# Patient Record
Sex: Female | Born: 1945
Health system: Southern US, Community
[De-identification: ages and names within clinical notes are randomized; demographics above are authoritative.]

## PROBLEM LIST (undated history)

## (undated) DIAGNOSIS — R569 Unspecified convulsions: Secondary | ICD-10-CM

## (undated) DIAGNOSIS — M8580 Other specified disorders of bone density and structure, unspecified site: Secondary | ICD-10-CM

## (undated) DIAGNOSIS — I341 Nonrheumatic mitral (valve) prolapse: Secondary | ICD-10-CM

## (undated) DIAGNOSIS — E785 Hyperlipidemia, unspecified: Secondary | ICD-10-CM

## (undated) DIAGNOSIS — F32A Depression, unspecified: Secondary | ICD-10-CM

## (undated) DIAGNOSIS — G9050 Complex regional pain syndrome I, unspecified: Secondary | ICD-10-CM

## (undated) DIAGNOSIS — G43109 Migraine with aura, not intractable, without status migrainosus: Secondary | ICD-10-CM

## (undated) DIAGNOSIS — K219 Gastro-esophageal reflux disease without esophagitis: Secondary | ICD-10-CM

## (undated) DIAGNOSIS — Z8619 Personal history of other infectious and parasitic diseases: Secondary | ICD-10-CM

## (undated) DIAGNOSIS — F329 Major depressive disorder, single episode, unspecified: Secondary | ICD-10-CM

## (undated) DIAGNOSIS — L511 Stevens-Johnson syndrome: Secondary | ICD-10-CM

## (undated) DIAGNOSIS — F419 Anxiety disorder, unspecified: Secondary | ICD-10-CM

## (undated) HISTORY — DX: Nonrheumatic mitral (valve) prolapse: I34.1

## (undated) HISTORY — DX: Complex regional pain syndrome I, unspecified: G90.50

## (undated) HISTORY — DX: Hyperlipidemia, unspecified: E78.5

## (undated) HISTORY — DX: Unspecified convulsions: R56.9

## (undated) HISTORY — DX: Depression, unspecified: F32.A

## (undated) HISTORY — PX: CATARACT EXTRACTION: SUR2

## (undated) HISTORY — DX: Major depressive disorder, single episode, unspecified: F32.9

## (undated) HISTORY — DX: Other specified disorders of bone density and structure, unspecified site: M85.80

## (undated) HISTORY — DX: Anxiety disorder, unspecified: F41.9

## (undated) HISTORY — DX: Stevens-Johnson syndrome: L51.1

## (undated) HISTORY — PX: ABDOMINAL HYSTERECTOMY: SHX81

## (undated) HISTORY — DX: Gastro-esophageal reflux disease without esophagitis: K21.9

## (undated) HISTORY — DX: Personal history of other infectious and parasitic diseases: Z86.19

---

## 1996-12-14 HISTORY — PX: HEEL SPUR SURGERY: SHX665

## 1997-11-02 ENCOUNTER — Ambulatory Visit: Admission: RE | Admit: 1997-11-02 | Discharge: 1997-11-02 | Payer: Self-pay | Admitting: Internal Medicine

## 1999-05-14 ENCOUNTER — Other Ambulatory Visit: Admission: RE | Admit: 1999-05-14 | Discharge: 1999-05-14 | Payer: Self-pay | Admitting: *Deleted

## 2000-06-04 ENCOUNTER — Other Ambulatory Visit: Admission: RE | Admit: 2000-06-04 | Discharge: 2000-06-04 | Payer: Self-pay | Admitting: *Deleted

## 2000-08-05 ENCOUNTER — Emergency Department (HOSPITAL_COMMUNITY): Admission: EM | Admit: 2000-08-05 | Discharge: 2000-08-06 | Payer: Self-pay | Admitting: Emergency Medicine

## 2000-08-06 ENCOUNTER — Encounter: Payer: Self-pay | Admitting: Emergency Medicine

## 2001-09-01 ENCOUNTER — Encounter: Admission: RE | Admit: 2001-09-01 | Discharge: 2001-11-30 | Payer: Self-pay | Admitting: Neurology

## 2001-09-13 LAB — CONVERTED CEMR LAB

## 2001-09-19 ENCOUNTER — Ambulatory Visit (HOSPITAL_COMMUNITY): Admission: RE | Admit: 2001-09-19 | Discharge: 2001-09-19 | Payer: Self-pay | Admitting: Gastroenterology

## 2001-09-29 ENCOUNTER — Encounter (INDEPENDENT_AMBULATORY_CARE_PROVIDER_SITE_OTHER): Payer: Self-pay | Admitting: *Deleted

## 2001-09-29 ENCOUNTER — Inpatient Hospital Stay (HOSPITAL_COMMUNITY): Admission: RE | Admit: 2001-09-29 | Discharge: 2001-10-02 | Payer: Self-pay | Admitting: Obstetrics & Gynecology

## 2001-11-16 ENCOUNTER — Encounter: Payer: Self-pay | Admitting: Obstetrics and Gynecology

## 2001-11-16 ENCOUNTER — Ambulatory Visit (HOSPITAL_COMMUNITY): Admission: RE | Admit: 2001-11-16 | Discharge: 2001-11-16 | Payer: Self-pay | Admitting: Obstetrics and Gynecology

## 2002-03-16 HISTORY — PX: ORIF DISTAL RADIUS FRACTURE: SUR927

## 2002-10-25 ENCOUNTER — Encounter: Payer: Self-pay | Admitting: Internal Medicine

## 2003-12-24 ENCOUNTER — Observation Stay (HOSPITAL_COMMUNITY): Admission: AD | Admit: 2003-12-24 | Discharge: 2003-12-25 | Payer: Self-pay | Admitting: Internal Medicine

## 2004-01-03 ENCOUNTER — Encounter: Admission: RE | Admit: 2004-01-03 | Discharge: 2004-04-02 | Payer: Self-pay | Admitting: Psychology

## 2004-01-25 ENCOUNTER — Ambulatory Visit: Payer: Self-pay | Admitting: Internal Medicine

## 2004-03-20 ENCOUNTER — Ambulatory Visit: Payer: Self-pay | Admitting: Internal Medicine

## 2004-03-27 ENCOUNTER — Ambulatory Visit: Payer: Self-pay | Admitting: Internal Medicine

## 2004-04-14 ENCOUNTER — Ambulatory Visit: Payer: Self-pay | Admitting: Internal Medicine

## 2004-07-08 ENCOUNTER — Ambulatory Visit: Payer: Self-pay | Admitting: Internal Medicine

## 2004-10-07 ENCOUNTER — Ambulatory Visit: Payer: Self-pay | Admitting: Internal Medicine

## 2004-10-31 ENCOUNTER — Ambulatory Visit: Payer: Self-pay | Admitting: Internal Medicine

## 2004-11-28 ENCOUNTER — Ambulatory Visit: Payer: Self-pay | Admitting: Internal Medicine

## 2004-12-10 ENCOUNTER — Ambulatory Visit: Payer: Self-pay | Admitting: Internal Medicine

## 2005-01-19 ENCOUNTER — Ambulatory Visit: Payer: Self-pay | Admitting: Internal Medicine

## 2005-01-28 ENCOUNTER — Encounter: Admission: RE | Admit: 2005-01-28 | Discharge: 2005-01-28 | Payer: Self-pay | Admitting: Internal Medicine

## 2005-03-24 ENCOUNTER — Ambulatory Visit: Payer: Self-pay | Admitting: Internal Medicine

## 2005-04-01 ENCOUNTER — Ambulatory Visit: Payer: Self-pay | Admitting: Cardiovascular Disease

## 2005-04-08 ENCOUNTER — Ambulatory Visit: Payer: Self-pay | Admitting: Internal Medicine

## 2005-06-04 ENCOUNTER — Encounter: Payer: Self-pay | Admitting: Internal Medicine

## 2005-06-04 LAB — HM COLONOSCOPY: HM Colonoscopy: NORMAL

## 2005-11-04 ENCOUNTER — Ambulatory Visit: Payer: Self-pay | Admitting: Internal Medicine

## 2005-11-18 ENCOUNTER — Ambulatory Visit: Payer: Self-pay | Admitting: Internal Medicine

## 2005-12-02 ENCOUNTER — Ambulatory Visit: Payer: Self-pay | Admitting: Family Medicine

## 2005-12-02 ENCOUNTER — Encounter: Payer: Self-pay | Admitting: Internal Medicine

## 2006-01-20 ENCOUNTER — Ambulatory Visit: Payer: Self-pay | Admitting: Internal Medicine

## 2006-02-02 ENCOUNTER — Ambulatory Visit: Payer: Self-pay | Admitting: Internal Medicine

## 2006-05-26 ENCOUNTER — Ambulatory Visit: Payer: Self-pay | Admitting: Internal Medicine

## 2006-05-31 ENCOUNTER — Ambulatory Visit: Payer: Self-pay | Admitting: Internal Medicine

## 2006-06-14 ENCOUNTER — Ambulatory Visit: Payer: Self-pay | Admitting: Internal Medicine

## 2006-08-16 ENCOUNTER — Ambulatory Visit: Payer: Self-pay | Admitting: Internal Medicine

## 2006-08-16 LAB — CONVERTED CEMR LAB
BUN: 9 mg/dL (ref 6–23)
CO2: 26 meq/L (ref 19–32)
Calcium: 9.2 mg/dL (ref 8.4–10.5)
Chloride: 110 meq/L (ref 96–112)
Creatinine, Ser: 0.5 mg/dL (ref 0.4–1.2)
GFR calc Af Amer: 162 mL/min
GFR calc non Af Amer: 134 mL/min
Glucose, Bld: 91 mg/dL (ref 70–99)
Potassium: 4 meq/L (ref 3.5–5.1)
Sodium: 142 meq/L (ref 135–145)

## 2006-08-19 ENCOUNTER — Ambulatory Visit: Payer: Self-pay | Admitting: Cardiovascular Disease

## 2006-09-15 ENCOUNTER — Encounter: Admission: RE | Admit: 2006-09-15 | Discharge: 2006-09-15 | Payer: Self-pay | Admitting: Family Medicine

## 2006-09-21 ENCOUNTER — Telehealth: Payer: Self-pay | Admitting: Internal Medicine

## 2006-10-11 DIAGNOSIS — Z872 Personal history of diseases of the skin and subcutaneous tissue: Secondary | ICD-10-CM | POA: Insufficient documentation

## 2006-10-11 DIAGNOSIS — K219 Gastro-esophageal reflux disease without esophagitis: Secondary | ICD-10-CM | POA: Insufficient documentation

## 2006-10-11 DIAGNOSIS — F4323 Adjustment disorder with mixed anxiety and depressed mood: Secondary | ICD-10-CM | POA: Insufficient documentation

## 2006-10-11 DIAGNOSIS — M899 Disorder of bone, unspecified: Secondary | ICD-10-CM | POA: Insufficient documentation

## 2006-10-11 DIAGNOSIS — F329 Major depressive disorder, single episode, unspecified: Secondary | ICD-10-CM

## 2006-10-11 DIAGNOSIS — F419 Anxiety disorder, unspecified: Secondary | ICD-10-CM

## 2006-10-11 DIAGNOSIS — F32A Depression, unspecified: Secondary | ICD-10-CM | POA: Insufficient documentation

## 2006-10-11 DIAGNOSIS — M949 Disorder of cartilage, unspecified: Secondary | ICD-10-CM

## 2006-10-12 DIAGNOSIS — E785 Hyperlipidemia, unspecified: Secondary | ICD-10-CM | POA: Insufficient documentation

## 2006-10-19 ENCOUNTER — Telehealth: Payer: Self-pay | Admitting: Internal Medicine

## 2006-11-29 ENCOUNTER — Ambulatory Visit: Payer: Self-pay | Admitting: Internal Medicine

## 2006-11-29 LAB — CONVERTED CEMR LAB
ALT: 17 units/L (ref 0–35)
AST: 20 units/L (ref 0–37)
Albumin: 3.6 g/dL (ref 3.5–5.2)
Alkaline Phosphatase: 99 units/L (ref 39–117)
BUN: 9 mg/dL (ref 6–23)
Basophils Absolute: 0.1 10*3/uL (ref 0.0–0.1)
Basophils Relative: 0.9 % (ref 0.0–1.0)
Bilirubin Urine: NEGATIVE
Bilirubin, Direct: 0.1 mg/dL (ref 0.0–0.3)
CO2: 27 meq/L (ref 19–32)
Calcium: 8.7 mg/dL (ref 8.4–10.5)
Chloride: 111 meq/L (ref 96–112)
Cholesterol: 228 mg/dL (ref 0–200)
Creatinine, Ser: 0.5 mg/dL (ref 0.4–1.2)
Direct LDL: 162.4 mg/dL
Eosinophils Absolute: 0.1 10*3/uL (ref 0.0–0.6)
Eosinophils Relative: 2.2 % (ref 0.0–5.0)
GFR calc Af Amer: 162 mL/min
GFR calc non Af Amer: 134 mL/min
Glucose, Bld: 82 mg/dL (ref 70–99)
Glucose, Urine, Semiquant: NEGATIVE
HCT: 36.3 % (ref 36.0–46.0)
HDL: 35.3 mg/dL — ABNORMAL LOW (ref >39.0)
Hemoglobin: 12.7 g/dL (ref 12.0–15.0)
Ketones, urine, test strip: NEGATIVE
Lymphocytes Relative: 29.9 % (ref 12.0–46.0)
MCHC: 35 g/dL (ref 30.0–36.0)
MCV: 89.3 fL (ref 78.0–100.0)
Monocytes Absolute: 0.5 10*3/uL (ref 0.2–0.7)
Monocytes Relative: 8.1 % (ref 3.0–11.0)
Neutro Abs: 3.9 10*3/uL (ref 1.4–7.7)
Neutrophils Relative %: 58.9 % (ref 43.0–77.0)
Nitrite: NEGATIVE
Platelets: 283 10*3/uL (ref 150–400)
Potassium: 4 meq/L (ref 3.5–5.1)
Protein, U semiquant: NEGATIVE
RBC: 4.06 M/uL (ref 3.87–5.11)
RDW: 12.6 % (ref 11.5–14.6)
Sodium: 143 meq/L (ref 135–145)
Specific Gravity, Urine: 1.02
TSH: 1.88 microintl units/mL (ref 0.35–5.50)
Total Bilirubin: 0.7 mg/dL (ref 0.3–1.2)
Total CHOL/HDL Ratio: 6.5
Total Protein: 6.4 g/dL (ref 6.0–8.3)
Triglycerides: 175 mg/dL — ABNORMAL HIGH (ref 0–149)
Urobilinogen, UA: 0.2
VLDL: 35 mg/dL (ref 0–40)
WBC Urine, dipstick: NEGATIVE
WBC: 6.6 10*3/uL (ref 4.5–10.5)
pH: 5.5

## 2006-12-06 ENCOUNTER — Ambulatory Visit: Payer: Self-pay | Admitting: Internal Medicine

## 2007-02-03 ENCOUNTER — Encounter: Payer: Self-pay | Admitting: Internal Medicine

## 2007-02-03 ENCOUNTER — Telehealth: Payer: Self-pay | Admitting: Internal Medicine

## 2007-02-04 ENCOUNTER — Encounter: Admission: RE | Admit: 2007-02-04 | Discharge: 2007-02-04 | Payer: Self-pay | Admitting: Internal Medicine

## 2007-02-04 ENCOUNTER — Telehealth: Payer: Self-pay | Admitting: Internal Medicine

## 2007-02-04 ENCOUNTER — Ambulatory Visit: Payer: Self-pay | Admitting: Internal Medicine

## 2007-02-04 LAB — CONVERTED CEMR LAB
ALT: 18 units/L (ref 0–35)
AST: 21 units/L (ref 0–37)
Albumin: 3.9 g/dL (ref 3.5–5.2)
Alkaline Phosphatase: 99 units/L (ref 39–117)
BUN: 10 mg/dL (ref 6–23)
Basophils Absolute: 0 10*3/uL (ref 0.0–0.1)
Basophils Relative: 0.4 % (ref 0.0–1.0)
Bilirubin, Direct: 0.1 mg/dL (ref 0.0–0.3)
CO2: 30 meq/L (ref 19–32)
Calcium: 9.5 mg/dL (ref 8.4–10.5)
Chloride: 107 meq/L (ref 96–112)
Creatinine, Ser: 0.5 mg/dL (ref 0.4–1.2)
Eosinophils Absolute: 0.1 10*3/uL (ref 0.0–0.6)
Eosinophils Relative: 1.6 % (ref 0.0–5.0)
GFR calc Af Amer: 162 mL/min
GFR calc non Af Amer: 134 mL/min
Glucose, Bld: 93 mg/dL (ref 70–99)
HCT: 38.7 % (ref 36.0–46.0)
Hemoglobin: 13.2 g/dL (ref 12.0–15.0)
Lymphocytes Relative: 25.7 % (ref 12.0–46.0)
MCHC: 34 g/dL (ref 30.0–36.0)
MCV: 90 fL (ref 78.0–100.0)
Monocytes Absolute: 0.6 10*3/uL (ref 0.2–0.7)
Monocytes Relative: 8.6 % (ref 3.0–11.0)
Neutro Abs: 4.8 10*3/uL (ref 1.4–7.7)
Neutrophils Relative %: 63.7 % (ref 43.0–77.0)
Platelets: 295 10*3/uL (ref 150–400)
Potassium: 4.5 meq/L (ref 3.5–5.1)
RBC: 4.31 M/uL (ref 3.87–5.11)
RDW: 12.6 % (ref 11.5–14.6)
Sodium: 143 meq/L (ref 135–145)
Total Bilirubin: 0.9 mg/dL (ref 0.3–1.2)
Total Protein: 7 g/dL (ref 6.0–8.3)
WBC: 7.4 10*3/uL (ref 4.5–10.5)

## 2007-02-09 ENCOUNTER — Telehealth: Payer: Self-pay | Admitting: Internal Medicine

## 2007-05-20 ENCOUNTER — Ambulatory Visit: Payer: Self-pay | Admitting: Internal Medicine

## 2007-07-22 ENCOUNTER — Telehealth (INDEPENDENT_AMBULATORY_CARE_PROVIDER_SITE_OTHER): Payer: Self-pay | Admitting: *Deleted

## 2007-07-25 ENCOUNTER — Ambulatory Visit: Payer: Self-pay | Admitting: Internal Medicine

## 2007-08-02 ENCOUNTER — Ambulatory Visit: Payer: Self-pay | Admitting: Internal Medicine

## 2007-12-13 ENCOUNTER — Ambulatory Visit: Payer: Self-pay | Admitting: Internal Medicine

## 2007-12-13 LAB — CONVERTED CEMR LAB
ALT: 19 units/L (ref 0–35)
AST: 26 units/L (ref 0–37)
Albumin: 3.9 g/dL (ref 3.5–5.2)
Alkaline Phosphatase: 85 units/L (ref 39–117)
BUN: 15 mg/dL (ref 6–23)
Basophils Absolute: 0 10*3/uL (ref 0.0–0.1)
Basophils Relative: 0.4 % (ref 0.0–3.0)
Bilirubin Urine: NEGATIVE
Bilirubin, Direct: 0.1 mg/dL (ref 0.0–0.3)
CO2: 27 meq/L (ref 19–32)
Calcium: 9 mg/dL (ref 8.4–10.5)
Chloride: 107 meq/L (ref 96–112)
Cholesterol: 259 mg/dL (ref 0–200)
Creatinine, Ser: 0.7 mg/dL (ref 0.4–1.2)
Direct LDL: 196.7 mg/dL
Eosinophils Absolute: 0.1 10*3/uL (ref 0.0–0.7)
Eosinophils Relative: 1.7 % (ref 0.0–5.0)
GFR calc Af Amer: 109 mL/min
GFR calc non Af Amer: 90 mL/min
Glucose, Bld: 101 mg/dL — ABNORMAL HIGH (ref 70–99)
Glucose, Urine, Semiquant: NEGATIVE
HCT: 37.1 % (ref 36.0–46.0)
HDL: 40.5 mg/dL (ref >39.0)
Hemoglobin: 12.9 g/dL (ref 12.0–15.0)
Ketones, urine, test strip: NEGATIVE
Lymphocytes Relative: 29.9 % (ref 12.0–46.0)
MCHC: 34.9 g/dL (ref 30.0–36.0)
MCV: 91 fL (ref 78.0–100.0)
Monocytes Absolute: 0.6 10*3/uL (ref 0.1–1.0)
Monocytes Relative: 10.8 % (ref 3.0–12.0)
Neutro Abs: 3.4 10*3/uL (ref 1.4–7.7)
Neutrophils Relative %: 57.2 % (ref 43.0–77.0)
Nitrite: NEGATIVE
Platelets: 312 10*3/uL (ref 150–400)
Potassium: 4.2 meq/L (ref 3.5–5.1)
Protein, U semiquant: NEGATIVE
RBC: 4.07 M/uL (ref 3.87–5.11)
RDW: 12.2 % (ref 11.5–14.6)
Sodium: 140 meq/L (ref 135–145)
Specific Gravity, Urine: 1.025
TSH: 2.12 microintl units/mL (ref 0.35–5.50)
Total Bilirubin: 0.7 mg/dL (ref 0.3–1.2)
Total CHOL/HDL Ratio: 6.4
Total Protein: 7 g/dL (ref 6.0–8.3)
Triglycerides: 158 mg/dL — ABNORMAL HIGH (ref 0–149)
Urobilinogen, UA: 0.2
VLDL: 32 mg/dL (ref 0–40)
WBC: 5.9 10*3/uL (ref 4.5–10.5)
pH: 5.5

## 2007-12-27 ENCOUNTER — Ambulatory Visit: Payer: Self-pay | Admitting: Internal Medicine

## 2008-02-07 ENCOUNTER — Ambulatory Visit: Payer: Self-pay | Admitting: Internal Medicine

## 2008-02-14 ENCOUNTER — Ambulatory Visit: Payer: Self-pay | Admitting: Internal Medicine

## 2008-02-21 ENCOUNTER — Ambulatory Visit: Payer: Self-pay | Admitting: Internal Medicine

## 2008-02-21 LAB — CONVERTED CEMR LAB
Bilirubin Urine: NEGATIVE
Blood in Urine, dipstick: NEGATIVE
Glucose, Urine, Semiquant: NEGATIVE
Ketones, urine, test strip: NEGATIVE
Nitrite: NEGATIVE
Protein, U semiquant: NEGATIVE
Specific Gravity, Urine: 1.02
Urobilinogen, UA: 0.2
WBC Urine, dipstick: NEGATIVE
pH: 6

## 2008-06-14 ENCOUNTER — Ambulatory Visit: Payer: Self-pay | Admitting: Internal Medicine

## 2008-06-14 ENCOUNTER — Encounter: Payer: Self-pay | Admitting: Internal Medicine

## 2008-06-14 ENCOUNTER — Observation Stay (HOSPITAL_COMMUNITY): Admission: EM | Admit: 2008-06-14 | Discharge: 2008-06-15 | Payer: Self-pay | Admitting: Emergency Medicine

## 2008-06-14 ENCOUNTER — Ambulatory Visit: Payer: Self-pay | Admitting: Cardiology

## 2008-06-15 ENCOUNTER — Ambulatory Visit: Payer: Self-pay | Admitting: Surgery

## 2008-06-15 ENCOUNTER — Encounter: Payer: Self-pay | Admitting: Internal Medicine

## 2008-06-18 ENCOUNTER — Telehealth: Payer: Self-pay | Admitting: Internal Medicine

## 2008-06-20 ENCOUNTER — Ambulatory Visit: Payer: Self-pay | Admitting: Internal Medicine

## 2008-06-25 ENCOUNTER — Telehealth: Payer: Self-pay | Admitting: Internal Medicine

## 2008-06-25 LAB — CONVERTED CEMR LAB
BUN: 10 mg/dL (ref 6–23)
CO2: 30 meq/L (ref 19–32)
Calcium: 9 mg/dL (ref 8.4–10.5)
Chloride: 111 meq/L (ref 96–112)
Creatinine, Ser: 0.5 mg/dL (ref 0.4–1.2)
GFR calc non Af Amer: 133.04 mL/min (ref >60)
Glucose, Bld: 90 mg/dL (ref 70–99)
Potassium: 4.3 meq/L (ref 3.5–5.1)
Sodium: 145 meq/L (ref 135–145)

## 2008-07-09 ENCOUNTER — Encounter: Payer: Self-pay | Admitting: Internal Medicine

## 2008-12-14 ENCOUNTER — Encounter: Payer: Self-pay | Admitting: Internal Medicine

## 2009-03-16 DIAGNOSIS — G9050 Complex regional pain syndrome I, unspecified: Secondary | ICD-10-CM

## 2009-03-16 HISTORY — DX: Complex regional pain syndrome I, unspecified: G90.50

## 2009-03-21 ENCOUNTER — Telehealth: Payer: Self-pay | Admitting: Internal Medicine

## 2009-03-26 ENCOUNTER — Encounter: Payer: Self-pay | Admitting: Internal Medicine

## 2009-04-22 ENCOUNTER — Telehealth: Payer: Self-pay | Admitting: Internal Medicine

## 2009-08-27 ENCOUNTER — Ambulatory Visit: Payer: Self-pay | Admitting: Internal Medicine

## 2009-12-18 ENCOUNTER — Telehealth: Payer: Self-pay | Admitting: Internal Medicine

## 2010-03-19 ENCOUNTER — Encounter
Admission: RE | Admit: 2010-03-19 | Discharge: 2010-03-19 | Payer: Self-pay | Source: Home / Self Care | Attending: Internal Medicine | Admitting: Internal Medicine

## 2010-03-19 LAB — HM MAMMOGRAPHY

## 2010-03-21 ENCOUNTER — Ambulatory Visit
Admission: RE | Admit: 2010-03-21 | Discharge: 2010-03-21 | Payer: Self-pay | Source: Home / Self Care | Attending: Internal Medicine | Admitting: Internal Medicine

## 2010-03-21 ENCOUNTER — Other Ambulatory Visit: Payer: Self-pay | Admitting: Internal Medicine

## 2010-03-21 LAB — LIPID PANEL
Cholesterol: 245 mg/dL — ABNORMAL HIGH (ref 0–200)
HDL: 39 mg/dL — ABNORMAL LOW (ref >39.00)
Total CHOL/HDL Ratio: 6
Triglycerides: 175 mg/dL — ABNORMAL HIGH (ref 0.0–149.0)
VLDL: 35 mg/dL (ref 0.0–40.0)

## 2010-03-21 LAB — HEPATIC FUNCTION PANEL
ALT: 17 U/L (ref 0–35)
AST: 21 U/L (ref 0–37)
Albumin: 3.8 g/dL (ref 3.5–5.2)
Alkaline Phosphatase: 96 U/L (ref 39–117)
Bilirubin, Direct: 0 mg/dL (ref 0.0–0.3)
Total Bilirubin: 0.8 mg/dL (ref 0.3–1.2)
Total Protein: 7 g/dL (ref 6.0–8.3)

## 2010-03-21 LAB — CBC WITH DIFFERENTIAL/PLATELET
Basophils Absolute: 0 10*3/uL (ref 0.0–0.1)
Basophils Relative: 0.7 % (ref 0.0–3.0)
Eosinophils Absolute: 0.2 10*3/uL (ref 0.0–0.7)
Eosinophils Relative: 2.6 % (ref 0.0–5.0)
HCT: 37.9 % (ref 36.0–46.0)
Hemoglobin: 13 g/dL (ref 12.0–15.0)
Lymphocytes Relative: 38.8 % (ref 12.0–46.0)
Lymphs Abs: 2.5 10*3/uL (ref 0.7–4.0)
MCHC: 34.3 g/dL (ref 30.0–36.0)
MCV: 90.5 fl (ref 78.0–100.0)
Monocytes Absolute: 0.7 10*3/uL (ref 0.1–1.0)
Monocytes Relative: 10.1 % (ref 3.0–12.0)
Neutro Abs: 3.1 10*3/uL (ref 1.4–7.7)
Neutrophils Relative %: 47.8 % (ref 43.0–77.0)
Platelets: 310 10*3/uL (ref 150.0–400.0)
RBC: 4.19 Mil/uL (ref 3.87–5.11)
RDW: 13.1 % (ref 11.5–14.6)
WBC: 6.6 10*3/uL (ref 4.5–10.5)

## 2010-03-21 LAB — URINALYSIS
Bilirubin Urine: NEGATIVE
Ketones, ur: NEGATIVE
Leukocytes, UA: NEGATIVE
Nitrite: NEGATIVE
Specific Gravity, Urine: 1.025 (ref 1.000–1.030)
Total Protein, Urine: NEGATIVE
Urine Glucose: NEGATIVE
Urobilinogen, UA: 0.2 (ref 0.0–1.0)
pH: 5.5 (ref 5.0–8.0)

## 2010-03-21 LAB — BASIC METABOLIC PANEL
BUN: 17 mg/dL (ref 6–23)
CO2: 25 mEq/L (ref 19–32)
Calcium: 9.2 mg/dL (ref 8.4–10.5)
Chloride: 109 mEq/L (ref 96–112)
Creatinine, Ser: 0.6 mg/dL (ref 0.4–1.2)
GFR: 107.18 mL/min (ref >60.00)
Glucose, Bld: 96 mg/dL (ref 70–99)
Potassium: 4.3 mEq/L (ref 3.5–5.1)
Sodium: 143 mEq/L (ref 135–145)

## 2010-03-21 LAB — LDL CHOLESTEROL, DIRECT: Direct LDL: 177.6 mg/dL

## 2010-03-24 LAB — TSH: TSH: 3.72 u[IU]/mL (ref 0.35–5.50)

## 2010-04-04 ENCOUNTER — Ambulatory Visit
Admission: RE | Admit: 2010-04-04 | Discharge: 2010-04-04 | Payer: Self-pay | Source: Home / Self Care | Attending: Internal Medicine | Admitting: Internal Medicine

## 2010-04-04 DIAGNOSIS — G43019 Migraine without aura, intractable, without status migrainosus: Secondary | ICD-10-CM | POA: Insufficient documentation

## 2010-04-04 DIAGNOSIS — G4710 Hypersomnia, unspecified: Secondary | ICD-10-CM | POA: Insufficient documentation

## 2010-04-17 NOTE — Miscellaneous (Signed)
Summary: flu shot   Clinical Lists Changes  Observations: Added new observation of FLU VAX: fluvirin (03/23/2009 11:18)      Immunization History:  Influenza Immunization History:    Influenza:  fluvirin (03/23/2009)  given at [facility]

## 2010-04-17 NOTE — Progress Notes (Signed)
Summary: FYI  Phone Note Call from Patient Call back at [facility] Phone [phone]   Caller: Patient Summary of Call: Pt is still being denied insurance because of COPD kidney disease diagnosis, but pt does not have COPD or kidney issues. According to [employer], a letter was sent to Dr. Cato Mulligan stating the reason why patient was denied  was due to COPD DX and kidney issues. Pt says for some reason, insurance company is not believing the statement Dr. Cato Mulligan had written.  Initial call taken by: Lucy Antigua,  March 21, 2009 3:29 PM  Follow-up for Phone Call        We do not have letter from insurance giving why they think she has these prob;ems.  Pt is working with Teacher, English as a foreign language and will try to get [place] this letter so Dr Cato Mulligan can respond. Follow-up by: Gladis Riffle, RN,  March 21, 2009 3:55 PM

## 2010-04-17 NOTE — Assessment & Plan Note (Signed)
Summary: fu on meds/njr   Vital Signs:  Patient profile:   65 year old female Weight:      150 pounds BMI:     26.25 Temp:     98.6 degrees F oral Pulse rate:   68 / minute Pulse rhythm:   regular Resp:     12 per minute BP sitting:   128 / 80  (left arm) Cuff size:   regular  Vitals Entered By: Gladis Riffle, RN (August 27, 2009 8:51 AM) CC: med review, needs refills Is Patient Diabetic? No   Primary Care Provider:  Birdie Sons MD  CC:  med review and needs refills.  History of Present Illness:  Follow-Up Visit      This is a 65 year old woman who presents for Follow-up visit.  The patient denies chest pain and palpitations.  Since the last visit the patient notes no new problems or concerns.  The patient reports taking meds as prescribed.  When questioned about possible medication side effects, the patient notes none.    All other systems reviewed and were negative   Preventive Screening-Counseling & Management  Alcohol-Tobacco     Alcohol drinks/day: 0     Smoking Status: quit > 6 months     Year Quit: 1985  Current Problems (verified): 1)  Elevated Bp Reading Without Dx Hypertension  (ICD-796.2) 2)  Migraine Unsp W/intractbl w/o Status Migrainosus  (ICD-346.91) 3)  Physical Examination  (ICD-V70.0) 4)  Stevens-johnson Syndrome, Hx of  (ICD-V13.3) 5)  Hyperlipidemia  (ICD-272.4) 6)  Osteopenia  (ICD-733.90) 7)  Gerd  (ICD-530.81) 8)  Depression  (ICD-311) 9)  Anxiety  (ICD-300.00)  Current Medications (verified): 1)  Atenolol 25 Mg Tabs (Atenolol) .... 1/2 By Mouth Two Times A Day 2)  Citalopram Hydrobromide 20 Mg  Tabs (Citalopram Hydrobromide) .... Take 1 Tablet By Mouth Once A Day 3)  Zegerid Otc 20-1100 Mg Caps (Omeprazole-Sodium Bicarbonate) .... Once Daily  Allergies (verified): 1)  ! Erythromycin 2)  ! Septra 3)  ! Pcn 4)  ! Sulfa 5)  ! Cephalexin 6)  ! Lactated Ringers (Lactated Ringers) 7)  ! Epinephrine 8)  ! Trimethoprim (Trimethoprim)  Past  History:  Past Medical History: Last updated: 10/11/2006 Anxiety Depression GERD Osteopenia MVP Migraine Allergies Levonne Spiller (sulfa) Reflex sympathetic dystrophy Hyperlipidemia  Past Surgical History: Last updated: 10/11/2006 Miscarriage x 2- 77, 79 Rt Heel spur-12/1996 Childbirth x 2 natural- [age] TAH/BSO  Family History: Last updated: 08/27/2009  parents living-elderly  brother deceased--CLL 89 yo  Social History: Last updated: 08/27/2009 Former Smoker Alcohol use-no Drug use-no Married---  Risk Factors: Alcohol Use: 0 (08/27/2009)  Risk Factors: Smoking Status: quit > 6 months (08/27/2009)  Family History:  parents living-elderly  brother deceased--CLL 14 yo  Social History: Former Smoker Alcohol use-no Drug use-no Married---  Physical Exam  General:  alert and well-developed.   Head:  normocephalic and atraumatic.   Ears:  R ear normal and L ear normal.   Nose:  no external deformity and no external erythema.   Neck:  No deformities, masses, or tenderness noted. Lungs:  normal respiratory effort and no intercostal retractions.   Heart:  normal rate and regular rhythm.   Abdomen:  soft and non-tender.   Pulses:  R radial normal and L radial normal.   Neurologic:  cranial nerves II-XII intact and gait normal.   Skin:  turgor normal and color normal.   Cervical Nodes:  no anterior cervical adenopathy and no posterior  cervical adenopathy.   Psych:  normally interactive and good eye contact.     Impression & Recommendations:  Problem # 1:  HYPERLIPIDEMIA (ICD-272.4) she refuses lab draw/medications $ of testing/meds too much Labs Reviewed: SGOT: 26 (12/13/2007)   SGPT: 19 (12/13/2007)   HDL:40.5 (12/13/2007), 35.3 (11/29/2006)  LDL:DEL (12/13/2007), DEL (11/29/2006)  Chol:259 (12/13/2007), 228 (11/29/2006)  Trig:158 (12/13/2007), 175 (11/29/2006)  Problem # 2:  GERD (ICD-530.81) continue current medications  sxs are controlled Her  updated medication list for this problem includes:    Zegerid Otc 20-1100 Mg Caps (Omeprazole-sodium bicarbonate) ..... Once daily  Labs Reviewed: Hgb: 12.9 (12/13/2007)   Hct: 37.1 (12/13/2007)  Problem # 3:  MIGRAINE UNSP W/INTRACTBL W/O STATUS MIGRAINOSUS (ICD-346.91)  doing well on meds Her updated medication list for this problem includes:    Atenolol 25 Mg Tabs (Atenolol) .Marland Kitchen... 1/2 by mouth two times a day   CT of Brain: CT Head Without Contrast - STATUS: Final  IMAGE                                     Perform Date: 1 Apr10 15:38  Ordered By: Thora Lance Date: 1 Apr10 15:09  Facility: [facility]                              Department: CT  Service Report Text  [facility] Accession Number: [id number]      Clinical Data: Headache.  Right-sided weakness.    CT HEAD WITHOUT CONTRAST    Technique:  Contiguous axial images were obtained from the base of   the skull through the vertex without contrast.    Comparison: MRI 12/24/2003.    Findings: Ventricle size is normal.  No intracranial hemorrhage is   seen.  There is no infarct or mass.  White matter appears normal.   Low density lesion in the left parietal bone with mild thickening   of the skull.  This was hyperintense on T2 on the prior MRI and  is   unchanged.  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    IMPRESSION:   No acute intracranial abnormality.    Read By:  Camelia Phenes,  M.D.   Released By:  Camelia Phenes,  M.D. (06/14/2008)  Problem # 4:  DEPRESSION (ICD-311) Tolerating meds and doing well on current meds Her updated medication list for this problem includes:    Citalopram Hydrobromide 20 Mg Tabs (Citalopram hydrobromide) .Marland Kitchen... Take 1 tablet by mouth once a day  Complete Medication List: 1)  Atenolol 25 Mg Tabs (Atenolol) .... 1/2 by mouth two times a day 2)  Citalopram Hydrobromide 20 Mg Tabs (Citalopram hydrobromide) .... Take 1 tablet by mouth once a day 3)  Zegerid Otc 20-1100 Mg  Caps (Omeprazole-sodium bicarbonate) .... Once daily Prescriptions: ZEGERID OTC 20-1100 MG CAPS (OMEPRAZOLE-SODIUM BICARBONATE) once daily  #90 x 3   Entered and Authorized by:   Birdie Sons MD   Signed by:   Birdie Sons MD on 08/27/2009   Method used:   Electronically to        [facility]  [phone]* (retail)       [address] [place], [place]  [address]       Ph: [phone] or  [phone]       Fax: [phone]   RxID:   [id number] CITALOPRAM HYDROBROMIDE 20 MG  TABS (CITALOPRAM HYDROBROMIDE) Take 1 tablet by mouth once a day  #90 x 3   Entered and Authorized by:   Birdie Sons MD   Signed by:   Birdie Sons MD on 08/27/2009   Method used:   Electronically to        [facility]  [phone]* (retail)       [address] [place], [place]  [address]       Ph: [phone] or [phone]       Fax: [phone]   RxID:   [id number] ATENOLOL 25 MG TABS (ATENOLOL) 1/2 by mouth two times a day  #30 x 11   Entered and Authorized by:   Birdie Sons MD   Signed by:   Birdie Sons MD on 08/27/2009   Method used:   Electronically to        [facility]  [phone]* (retail)       [address] [place], [place]  [address]       Ph: [phone] or [phone]       Fax: [phone]   RxID:   [id number]

## 2010-04-17 NOTE — Progress Notes (Signed)
Summary: Rx request  Phone Note Call from Patient Call back at Home Phone [phone]   Caller: Patient Call For: Lacey Salas Summary of Call[facility] Pt has a trip planned to [place], and says Dr. Cato Mulligan agreed to call RX in for her claustrophobia.   Initial call taken by: Lynann Beaver CMA,  December 18, 2009 11:11 AM  Follow-up for Phone Call        lorazepam 0.5 mg by mouth 1 hour before flight  #8/0 Follow-up by: Lacey Salas,  December 18, 2009 12:35 PM    New/Updated Medications: LORAZEPAM 0.5 MG TABS (LORAZEPAM) one hour before flight Prescriptions: LORAZEPAM 0.5 MG TABS (LORAZEPAM) one hour before flight  #8 x 0   Entered by:   Lynann Beaver CMA   Authorized by:   Lacey Salas   Signed by:   Lynann Beaver CMA on 12/18/2009   Method used:   Telephoned to ...       [facility]  [phone]* (retail)       [address] [place], [place]  [address]       Ph: [phone] or [phone]       Fax: [phone]   RxID:   [id number]

## 2010-04-17 NOTE — Progress Notes (Signed)
Summary: atenolol  Phone Note Refill Request Message from:  Fax from Pharmacy  Refills Requested: Medication #1:  ATENOLOL 25 MG TABS 1/2 by mouth two times a day [facility] [phone]     FAX---[phone]  Initial call taken by: Warnell Forester,  April 22, 2009 10:13 AM    Prescriptions: ATENOLOL 25 MG TABS (ATENOLOL) 1/2 by mouth two times a day  #30 x 5   Entered by:   Gladis Riffle, RN   Authorized by:   Birdie Sons MD   Signed by:   Gladis Riffle, RN on 04/22/2009   Method used:   Electronically to        [facility]  [phone]* (retail)       [address] [place], [place]  [address]       Ph: [phone] or [phone]       Fax: [phone]   RxID:   [id number]

## 2010-04-17 NOTE — Assessment & Plan Note (Signed)
Summary: cpx//ccm   Vital Signs:  Patient profile:   65 year old female Height:      63.5 inches Weight:      151 pounds Temp:     98.6 degrees F oral Pulse rate:   84 / minute Pulse rhythm:   regular BP sitting:   122 / 74  (left arm) Cuff size:   regular  Vitals Entered By: Alfred Levins, CMA (April 04, 2010 10:09 AM) CC: cpx, no pap   Primary Care Provider:  Birdie Sons MD  CC:  cpx and no pap.  History of Present Illness: cpx  new complaint; "i'm tired". sxs ongoing for several months. husband states that she snores. it is not clear whether she "stops breathing" at night. she admits to daytime hypersomnolence.  All other systems reviewed and were negative   Current Problems (verified): 1)  Physical Examination  (ICD-V70.0) 2)  Stevens-johnson Syndrome, Hx of  (ICD-V13.3) 3)  Hyperlipidemia  (ICD-272.4) 4)  Osteopenia  (ICD-733.90) 5)  Gerd  (ICD-530.81) 6)  Depression  (ICD-311) 7)  Anxiety  (ICD-300.00)  Current Medications (verified): 1)  Atenolol 25 Mg Tabs (Atenolol) .... 1/2 By Mouth Two Times A Day 2)  Citalopram Hydrobromide 20 Mg  Tabs (Citalopram Hydrobromide) .... Take 1 Tablet By Mouth Once A Day 3)  Zegerid Otc 20-1100 Mg Caps (Omeprazole-Sodium Bicarbonate) .... Once Daily  Allergies (verified): 1)  ! Erythromycin 2)  ! Septra 3)  ! Pcn 4)  ! Sulfa 5)  ! Cephalexin 6)  ! Lactated Ringers (Lactated Ringers) 7)  ! Epinephrine 8)  ! Trimethoprim (Trimethoprim)  Past History:  Past Medical History: Last updated: 10/11/2006 Anxiety Depression GERD Osteopenia MVP Migraine Allergies Levonne Spiller (sulfa) Reflex sympathetic dystrophy Hyperlipidemia  Past Surgical History: Last updated: 10/11/2006 Miscarriage x 2- 77, 79 Rt Heel spur-12/1996 Childbirth x 2 natural- [age] TAH/BSO  Family History: Last updated: 04/04/2010  parents living-elderly  brother deceased--CLL 49 yo sister with [place]  Social History: Last updated:  08/27/2009 Former Smoker Alcohol use-no Drug use-no Married---  Risk Factors: Alcohol Use: 0 (08/27/2009)  Risk Factors: Smoking Status: quit > 6 months (08/27/2009)  Family History:  parents living-elderly  brother deceased--CLL 69 yo sister with [place]  Physical Exam  General:   well-developed female in no acute distress. HEENT exam atraumatic, normocephalic. Conjunctiva are pink. Neck is supple without lymphadenopathy or thyromegaly. No jugular venous distention. Chest clear to auscultation without increased work of breathing. Cardiac exam S1-S2 are regular. Abdominal exam active bowel sounds, soft nontender. Extremities no edema. Neurologic exam she is alert. Her affect is flat.gait is normal   Impression & Recommendations:  Problem # 1:  PHYSICAL EXAMINATION (ICD-V70.0) health maint utd advised regular exercise  Problem # 2:  HYPERSOMNIA (ICD-780.54)  further evaluation. Will set up sleep study. Orders: Sleep Disorder Referral (Sleep Disorder)  Complete Medication List: 1)  Atenolol 25 Mg Tabs (Atenolol) .... 1/2 by mouth two times a day 2)  Citalopram Hydrobromide 20 Mg Tabs (Citalopram hydrobromide) .... Take 1 tablet by mouth once a day 3)  Zegerid Otc 20-1100 Mg Caps (Omeprazole-sodium bicarbonate) .... Once daily 4)  Simvastatin 10 Mg Tabs (Simvastatin) .Marland Kitchen.. 1 by mouth at bedtime  Other Orders: Neurology Referral (Neuro)  Patient Instructions: 1)  3 months 2)  lipids 272.4 3)  liver 995.2  4)  we will call you about neurology appt and slee study Prescriptions: SIMVASTATIN 10 MG TABS (SIMVASTATIN) 1 by mouth at bedtime  #90  x 3   Entered and Authorized by:   Birdie Sons MD   Signed by:   Birdie Sons MD on 04/04/2010   Method used:   Electronically to        [facility]  [phone]* (retail)       [address] [place], [place]  [address]       Ph: [phone] or [phone]       Fax: [phone]   RxID:   [id number]    Orders  Added: 1)  Est. Patient Level III [36644] 2)  Sleep Disorder Referral [Sleep Disorder] 3)  Est. Patient 40-64 years [03474] 4)  Neurology Referral [Neuro]    Preventive Care Screening  Mammogram:    Date:  03/16/2010    Next Due:  03/2011    Results:  normal

## 2010-05-06 ENCOUNTER — Encounter (HOSPITAL_BASED_OUTPATIENT_CLINIC_OR_DEPARTMENT_OTHER): Payer: Self-pay

## 2010-06-04 ENCOUNTER — Other Ambulatory Visit: Payer: Self-pay

## 2010-06-10 ENCOUNTER — Encounter: Payer: Self-pay | Admitting: Internal Medicine

## 2010-06-11 ENCOUNTER — Ambulatory Visit: Payer: Self-pay | Admitting: Internal Medicine

## 2010-06-11 DIAGNOSIS — Z0289 Encounter for other administrative examinations: Secondary | ICD-10-CM

## 2010-06-25 LAB — COMPREHENSIVE METABOLIC PANEL
ALT: 15 U/L (ref 0–35)
AST: 27 U/L (ref 0–37)
Alkaline Phosphatase: 84 U/L (ref 39–117)
CO2: 22 mEq/L (ref 19–32)
GFR calc non Af Amer: >60 mL/min (ref >60)
Glucose, Bld: 110 mg/dL — ABNORMAL HIGH (ref 70–99)
Potassium: 3.2 mEq/L — ABNORMAL LOW (ref 3.5–5.1)
Sodium: 139 mEq/L (ref 135–145)

## 2010-06-25 LAB — POCT I-STAT, CHEM 8
HCT: 39 % (ref 36.0–46.0)
Hemoglobin: 13.3 g/dL (ref 12.0–15.0)
Sodium: 141 mEq/L (ref 135–145)
TCO2: 22 mmol/L (ref 0–100)

## 2010-06-25 LAB — CBC
HCT: 38.1 % (ref 36.0–46.0)
Hemoglobin: 13.2 g/dL (ref 12.0–15.0)
MCHC: 34.7 g/dL (ref 30.0–36.0)
MCV: 90 fL (ref 78.0–100.0)
Platelets: 285 10*3/uL (ref 150–400)
RBC: 4.23 MIL/uL (ref 3.87–5.11)
RDW: 13 % (ref 11.5–15.5)
WBC: 7.6 10*3/uL (ref 4.0–10.5)

## 2010-06-25 LAB — DIFFERENTIAL
Basophils Absolute: 0.1 10*3/uL (ref 0.0–0.1)
Basophils Relative: 1 % (ref 0–1)
Monocytes Absolute: 0.6 10*3/uL (ref 0.1–1.0)
Neutro Abs: 4.6 10*3/uL (ref 1.7–7.7)

## 2010-06-25 LAB — HEPATIC FUNCTION PANEL
AST: 26 U/L (ref 0–37)
Bilirubin, Direct: <0.1 mg/dL (ref 0.0–0.3)

## 2010-06-25 LAB — HEMOGLOBIN A1C: Hgb A1c MFr Bld: 5.4 % (ref 4.6–6.1)

## 2010-06-25 LAB — POCT CARDIAC MARKERS: Myoglobin, poc: 58.2 ng/mL (ref 12–200)

## 2010-06-25 LAB — PROTIME-INR
INR: 1 (ref 0.00–1.49)
Prothrombin Time: 13 seconds (ref 11.6–15.2)

## 2010-06-25 LAB — LIPID PANEL
HDL: 35 mg/dL — ABNORMAL LOW (ref >39)
LDL Cholesterol: 155 mg/dL — ABNORMAL HIGH (ref 0–99)
Total CHOL/HDL Ratio: 6.3 RATIO

## 2010-07-08 ENCOUNTER — Other Ambulatory Visit: Payer: Self-pay | Admitting: *Deleted

## 2010-07-08 DIAGNOSIS — I10 Essential (primary) hypertension: Secondary | ICD-10-CM

## 2010-07-08 MED ORDER — ATENOLOL 25 MG PO TABS: 25.0000 mg | ORAL_TABLET | Freq: Every day | ORAL | Status: DC

## 2010-07-29 NOTE — Discharge Summary (Signed)
Lacey, Salas NO.:  [id number]   MEDICAL RECORD NO.:  [id number]          PATIENT TYPE:  INP   LOCATION:  3018                         FACILITY:  [facility]   PHYSICIAN:  Barbette Hair. Artist Pais, DO      DATE OF BIRTH:  [birth year]   DATE OF ADMISSION:  06/14/2008  DATE OF DISCHARGE:  06/15/2008                               DISCHARGE SUMMARY   DISCHARGE DIAGNOSES:  1. Dysarthria and mild left facial droop secondary to complicated      migraine.  2. Hyperlipidemia.  3. Anxiety disorder.  4. Gastroesophageal reflux disease.  5. History of Stevens-Johnson syndrome secondary to sulfa.   DISCHARGE MEDICATIONS:  1. Atenolol 25 mg 1/2 tablet twice daily.  2. Citalopram 40 mg once daily.  3. Omeprazole 20 mg once daily before meals.  4. Zyrtec 10 mg once daily as needed.   FOLLOWUP INSTRUCTIONS:  She is to call Dr. Cato Mulligan' office for a followup  appointment within 1 week.  The patient advised to call PCP if  recurrence of headache or focal neurologic symptoms.   HOSPITAL COURSE:  The patient is a 65 year old white female with a long  history of complicated migraines followed at [facility] who  presented with headache, visual auras, and drunk feeling.  The patient  unable to walk or speak clearly.  She felt treated with Tylenol,  caffeinated coke, and rest/darkness.  Her symptoms improved but still  came to the ER for further evaluation.  ER physician was concerned of  possibility of acute stroke.  MRI/MRA of the brain was ordered.  MRI was  negative for acute or reversible process.  Chronic-appearing small  vessel changes within the pons and hemispheric white matter was noted.  MRA was unremarkable.  A carotid Doppler was also obtained.  Preliminary  results showed no significant ICA stenosis.  At time of this dictation,  2-D echo was pending.   At time of discharge, the patient's headache had improved.  She still  complained of slight thickness of her speech.   Neurologic exam noted  minimal droop left side of her mouth.  Her left-sided facial muscles  were otherwise unremarkable.  Her muscle strength was 5/5 throughout.   LABORATORY DATA:  CBC:  WBC 7.6, H and H of 13.3 and 39.0, and platelet  count of 285.  INR was 1.0.  Sodium 139, potassium 3.2, chloride 110,  CO2 22, glucose 110, BUN 11, and creatinine of 0.52.  Cardiac enzyme was  negative x1.  LFTs were unremarkable.  Hemoglobin A1c was 5.4.  Lipid  panel showed total cholesterol 222, triglycerides 160, HDL 35, LDL of  155, and cholesterol ratio was 6.3.   CONDITION ON DISCHARGE:  The patient's headache and neurologic symptoms  significantly improved.  She was felt medically stable for discharge.  Please note, she was given 40 mEq of potassium prior to discharge.  I  will defer to her primary care physician whether he repeats a basic  metabolic profile as an outpatient.  The patient also advised to start  statin therapy and follow  up with her primary care physician.      Barbette Hair. Artist Pais, DO  Electronically Signed     RDY/MEDQ  D:  06/15/2008  T:  06/16/2008  Job:  [id number]   cc:   Valetta Mole. Swords, MD

## 2010-08-01 NOTE — H&P (Signed)
Lacey Salas, Lacey Salas                 ACCOUNT NO.:  [id number]   MEDICAL RECORD NO.:  [id number]          PATIENT TYPE:  INP   LOCATION:  3016                         FACILITY:  [facility]   PHYSICIAN:  Valetta Mole. Swords, M.D. [facility] OF BIRTH:  1945/11/21   DATE OF ADMISSION:  12/24/2003  DATE OF DISCHARGE:                                HISTORY & PHYSICAL   CHIEF COMPLAINT:  Weakness.   HISTORY OF PRESENT ILLNESS:  Lacey Salas, date of birth is October 30, 1943,  was in her usual state of health until earlier this weekend.  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  She sat down and five minutes later  was significantly better.  She went about her work and then developed very  weak legs to the point where she could not bear weight. She thought the  symptoms were more right-sided than left sided.  She now states that she  feels weak all over, but her right leg seems worse.  She denies any trauma,  any pain.  It is interesting to note that she has had these symptoms four to  five times over the past two to three years.  She has never had blurred  vision previously.  She does report that on Saturday she had an episode of  weak legs which spontaneously resolved over a matter of 5 to 30 minutes.  She does have a history of migraine headaches which have been previously  evaluated by Dr. Meryl Crutch and also Dr. Orlin Hilding.   PAST MEDICAL HISTORY:  1.  TAH-BSO.  2.  Rectocele repair.  3.  Uterine prolapse.  4.  Miscarriage x 2.  5.  History of heel spurs.  6.  Two normal pregnancies.  7.  She has been diagnosed with mitral valve prolapse, although in 2002, she      had an echocardiogram which was normal.  8.  History of migraine headaches.  9.  Allergic rhinitis.  10. History of anxiety and depression disorder.  11. History of Stevens-Johnson  syndrome secondary to SULFA.  12. History of gastroesophageal reflux disease.  13. Osteopenia.  14. History of reflux sympathetic dystrophy affecting right arm.  15. Questionable history of SVT for which she takes atenolol.   CURRENT MEDICATIONS:  1.  Atenolol 25 mg 1/2 tablet p.o. b.i.d.  2.  Nexium 40 mg p.o. daily (currently taking every other day).  3.  Neurontin 100 mg p.o. q.a.m. and at noon and 200 mg p.o. q.h.s.   SOCIAL HISTORY:  She is married.  She is a Manufacturing systems engineer.  She quit  tobacco in 1988.   FAMILY HISTORY:  Mother alive at 7 and recently healthy.  Father alive at  38 and recently healthy.  No history of stroke in either.  Had a grandmother  with history of a stroke.   REVIEW OF SYSTEMS:  Reviewed in detail (15-part review of symptoms)  completely  normal except her husband says she has had some blotchiness  around her neck.  He describes these as areas of erythema.   PHYSICAL EXAMINATION:  VITAL SIGNS:  Temperature 98, pulse 70, respirations  14, blood pressure 110/70.  GENERAL:  She appears to be a well-developed, well-nourished female in no  acute distress.  She is sitting in a wheelchair.  HEENT:  Atraumatic and normocephalic.  Extraocular muscles are intact.  NECK:  Supple without lymphadenopathy, thyromegaly, jugular venous  distention, or carotid bruits.  CRANIAL NERVES:  Cranial nerves II-XII appear intact  CHEST:  Clear to auscultation without any increased work of breathing.  CARDIAC:  S1, S2 normal without murmurs or gallops.  ABDOMEN:  Active bowel sounds.  Soft, nontender.  There is no  hepatosplenomegaly and no masses palpated.  EXTREMITIES:  No clubbing, cyanosis, or edema.  NEUROLOGIC:  Alert and oriented without any sensory deficits.  Deep tendon  reflexes are somewhat brisk at the knee and ankles, normal in the upper  extremities.  There is no differnetial in reflexes.  Toes are downgoing on  Babinski maneuver.  Hip flexors and extensors  appear intact. Quadriceps and  hamstrings are normal.  Extensor hallicis longus is normal.  Gastrocnemius  are normal bilaterally.  Anterior tibialis is normal bilaterally.  Peripheral pulses are normal.   I attempted to walk with the patient.  She was able to stand up out of a  wheelchair with minimal assistance, but on the first step, she was unable to  bear weight.  Upper extremity strength seems normal.   LABORATORY DATA:  None.   IMPRESSION AND PLAN:  1.  New neurologic symptoms, question cause.  I doubt this is a stroke, but      I think safest to rule this out in the hospital.  Will check a CT of the      head to rule out a bleed unless an MRI can be done with and without      contrast on admission.  If that is the case, she will not need a CT      without contrast.  We will obtain an MRI of her brain, question the      possibility of multiple sclerosis.  She had an evaluation with an MRI      two to three years ago for evaluation of migraine headaches.  I am told      that was unrevealing, but she does not know the details.   I do want to rule out multiple sclerosis.  Workup as above.   It is possible this could be a migraine variant, but I doubt it.  I will ask  neurology to become involved.  Other possibilities include ascending  polyneuropathy, but it is an unusual history and unusual exam.       BHS/MEDQ  D:  12/24/2003  T:  12/24/2003  Job:  [id number]   cc:   Genene Churn. Love, M.D.  [address]  [address]  [place]  [place] [address]  Fax: [phone]

## 2010-08-01 NOTE — H&P (Signed)
[facility]  Patient:    Lacey Salas, Lacey Salas Visit Number: [id number] MRN: [id number]          Service Type: GYN Location: 9300 [id number] Attending Physician:  Minette Headland Dictated by:   Freddy Finner, M.D. Admit Date:  09/29/2001                           History and Physical  PREOPERATIVE HISTORY AND PHYSICAL  ADMITTING DIAGNOSES: 1. Symptomatic uterine prolapse. 2. First-degree rectocele. 3. First-degree cystocele.  HISTORY OF PRESENT ILLNESS:   The patient is a 65 year old white married female, gravida 4, para 2, who was seen in my practice many years ago but renewed visits in my practice in November of 2002.  She presented complaining of something prolapsing through the vaginal introitus and on examination at that time was found to have cervical prolapse.  The options of therapy were discussed with the patient including total vaginal hysterectomy, bilateral salpingo-oophorectomy, possible anterior and posterior colporrhaphy.  She returned to the office for followup examination and consultation on Jul 19, 2001, and at this time she has requested definitive surgery.  She does have symptoms of pelvic relaxation with cervicouterine prolapse and mild symptoms with cystocele and rectocele.  She has requested definitive surgical intervention.  She has reviewed a video in the office describing the operative procedure and is now admitted for total vaginal hysterectomy and bilateral salpingo-oophorectomy, anterior and posterior vaginal repair.  CURRENT REVIEW OF SYSTEMS:    Otherwise negative.  She has no cardiopulmonary, GI or other GU complaints at the present time.  PAST MEDICAL HISTORY:         The patient is known to have irritable bowel syndrome and reflux esophagitis and she takes Protonix for this on a regular basis.  Further she is known to have migraine headaches and for this she takes atenolol and lorazepam; atenolol is 25 mg a  day.  PREVIOUS SURGICAL PROCEDURES: 1. Surgical corrective for Stevens-Johnson syndrome with heel spurring and    Achilles tendon repair. 2. She has had three vaginal births. 3. She has had two miscarriages.  ALLERGIES:                    She does have allergies to ERYTHROMYCIN, CEPHALOSPORINS, PENICILLIN, TRIMETHOPRIM, SULFA, ZOMAX, and PAXIL.  SOCIAL HISTORY:               She has never had a blood transfusion and she does not use cigarettes.  FAMILY HISTORY:               Noncontributory.  PHYSICAL EXAMINATION:  HEENT:                        Grossly within normal limits.  NECK:                         The thyroid gland is not palpably enlarged.  CHEST:                        Clear to auscultation.  HEART:                        Normal sinus rhythm without murmurs, rubs or gallops.  ABDOMEN:  Soft and nontender without appreciable organomegaly or palpable masses.  EXTREMITIES:                  Without cyanosis, clubbing or edema.  PELVIC:                       The findings are described in the present illness.  EXTREMITIES:                  Without cyanosis, clubbing or edema.  ASSESSMENT:                   Symptomatic pelvic relaxation with uterine prolapse, cystocele and rectocele.  PLAN:                         Total vaginal hysterectomy, anterior and posterior vaginal repair and bilateral salpingo-oophorectomy. Dictated by:   Freddy Finner, M.D. Attending Physician:  Minette Headland DD:  09/28/01 TD:  09/29/01 Job: [id number]

## 2010-08-01 NOTE — Discharge Summary (Signed)
Lacey Salas, LEAVENS NO.:  [id number]   MEDICAL RECORD NO.:  [id number]          PATIENT TYPE:  INP   LOCATION:  3016                         FACILITY:  [facility]   PHYSICIAN:  Thomos Lemons, D.O. [facility]   DATE OF BIRTH:  05-Dec-1945   DATE OF ADMISSION:  12/24/2003  DATE OF DISCHARGE:  12/25/2003                                 DISCHARGE SUMMARY   DISCHARGE DIAGNOSES:  1.  Diffuse weakness.  2.  Complex migraine.  3.  Depression.   HISTORY OF PRESENT ILLNESS:  The patient is a 65 year old white female who  had an episode of blurred vision while at work.  She sat down and five  minutes later this had improved.  She then went back to work where she  became so weak in the legs that she felt she could not bear weight.  She  felt the symptoms were more right-sided than left.  On evaluation she  described generalized weakness.  She described four to five episodes over  the past two to three years, although she does not report blurred vision in  the past.   PAST MEDICAL/SURGICAL HISTORY:  1.  Total abdominal hysterectomy/bilateral salpingo-oophorectomy.  2.  Rectocele repair.  3.  Uterine prolapse.  4.  Miscarriage x2.  5.  History of heel spurs.  6.  Mitral valve prolapse, although a 2-D echocardiogram in 2002, was      normal.  7.  Migraine headaches.  8.  Allergic rhinitis.  9.  History of anxiety and depression.  10. Stevens-Johnson syndrome secondary to sulfa.  11. Gastroesophageal reflux disease.  12. Osteopenia.  13. RSD of the right upper extremity.  14. Questionable SVT.   HOSPITAL COURSE:  #1 - GENERALIZED WEAKNESS WITH SOME ASSOCIATED VISUAL  CHANGES:  The patient was seen in consultation by Dr. Genene Churn. Love.  He  suspected that this was a complex migraine.  He did recommend continuing her  atenolol and asked her to consider making dietary changes in regards to  migraines.  He also recommended an electroencephalogram and an MRI.  The  patient had an MRI  of the brain which was essentially unremarkable.  The  electroencephalogram has not been done, but Dr. Sandria Manly feels that the patient  can be discharged after the electroencephalogram is completed.  He sees no  indication of seizure activity.  He had no further recommendations except  for her to continue her current medications.  At this time he did not  recommend preventative medications.  He did feel that she had significant  underlying psychosocial issues, as well as significant depression, and felt  that she would benefit from a followup with Dr. Gladstone Pih, Ph.D.,  whom she states she has had a good relationship with in the past.  #2 - LEUKOCYTOSIS WITHOUT FEVER:  The patient's white count was 15,000 on  admission.  Chest x-ray was normal.  We will repeat her CBC prior to  discharge.  #3 - GASTROINTESTINAL:  The patient had a mildly elevated total bilirubin at  1.4 and her AST was mildly  elevated at 39, otherwise the liver function  tests were normal.  The patient is asymptomatic, and we will repeat her  hepatic function profile prior to discharge.   DISCHARGE MEDICATIONS:  1.  Atenolol 25 mg, 1/2 tab b.i.d.  2.  Nexium 40 mg daily.  3.  Neurontin 100 mg q.a.m. and at noon, and 200 mg at bedtime.   FOLLOWUP:  1.  Follow up with Dr. Leonides Cave.  His office will call to scheduled the      followup.  2.  Follow up with Dr. Valetta Mole. Swords in one to two weeks.       LC/MEDQ  D:  12/25/2003  T:  12/25/2003  Job:  [id number]   cc:   Valetta Mole. Swords, M.D. [facility]   Genene Churn. Love, M.D.  [address]  [address]  [place]  [place] [address]  Fax: [phone]   Gladstone Pih, Ph.D.  [address] [place]  [place] [address]

## 2010-08-01 NOTE — Discharge Summary (Signed)
[facility]  Patient:    Lacey Salas, Lacey Salas Visit Number: [id number] MRN: [id number]          Service Type: GYN Location: 9300 [id number] [id number] Attending Physician:  Minette Headland Dictated by:   Freddy Finner, M.D. Admit Date:  09/29/2001 Discharge Date: 10/02/2001                             Discharge Summary  DISCHARGE DIAGNOSES:          1. Symptomatic pelvic relaxation.                               2. Uterine prolapse.                               3. Cystocele.                               4. Rectocele.  OPERATIVE PROCEDURES:         1. Total vaginal hysterectomy.                               2. Bilateral salpingo-oophorectomy.                               3. Anterior and posterior colporrhaphy.  POSTOPERATIVE COMPLICATIONS:  No major complications, although the patient had hemorrhoids with pain and slow return of bowel function.  DISPOSITION:                  The patient was is satisfactory and improved condition at the time of discharge.  She was ambulating without assistance. She was voiding small amounts with significant residual.  She was to be discharged to home with catheter instructions for continued voiding trials. She was to call for fever, for heavy bleeding or for severe pain.  She was given Percocet 5 mg, to be taken every 4-6 hours as needed for postoperative pain.  She was given Xanax 0.5 mg, to be taken one t.i.d. p.r.n.   She was instructed to use Sitz baths, to ambulate regularly and to take a regular diet.  NOTE:                         Details of the present illness, past history, family history, review of systems and physical examination are recorded in the admission note.  The patients physical findings on admission were primarily remarkable for pelvic relaxation and uterine prolapse.  LABORATORY DATA:              Admission hemoglobin 12.9.  Postoperative hemoglobin 10.8 and 10.4 on the first and second  postoperative days respectively.  Admission prothrombin time, PTT and INR were essentially normal.  HOSPITAL COURSE:              The patient was admitted on the morning of surgery.  She was given an antibiotic bolus IV preoperatively.  She was placed in PAS hose.  She was brought to the operating room, where the above-described procedure was accomplished without significant intraoperative difficulties.  A vaginal pack was placed and removed on the first  postoperative day.  The patient had one temperature elevation on the evening of surgery to 101.3, probably associated with the pack or the surgical procedure itself. Thereafter, she remained completely afebrile, although she was started in Cipro 500 mg b.i.d. per Dr. Arelia Sneddon.  This was discontinued at the time of her discharge.  In addition postoperatively, we used Urelle q.i.d. for bladder spasm.  She was given Nupercainal for her hemorrhoids.  By the morning of the third postoperative day, her condition was considered to be satisfactory for discharge.  Further disposition is noted above. Dictated by:   Freddy Finner, M.D. Attending Physician:  Minette Headland DD:  10/02/01 TD:  10/07/01 Job: [id number]

## 2010-08-01 NOTE — Assessment & Plan Note (Signed)
[facility]                                 ON-CALL NOTE   Lacey Salas, Lacey Salas                     MRN:          [id number]  DATE:06/04/2006                            DOB:          1945/06/01    Telephone [phone].  Patient reports 1 week of upper respiratory  infection symptoms including fever, chest congestion, and coughing up  brown sputum.  She was seen in the office on Monday by Dr. Cato Mulligan, and  was given a course of Levaquin to take daily.  She has been doing this,  however, for the past 2 days she has been developing some worsening pain  in the Achilles tendon behind her left foot.  There has been no trauma.  She has never had anything like this before.  In reading information  about Levaquin, she notes that there are rarely complications of  tendinitis, which can occur acutely when using this medication.  She is  quite concerned, and asks my advice.   Of note, she lists allergies to:  1. TRIMETHOPRIM.  2. ERYTHROMYCIN.  3. CEPHALOSPORINS.  4. PENICILLIN.  5. SULFA.   My response is, first off that indeed this tendinitis could possibly be  a rare side effect of Levaquin, and I advised her to stop taking it.  She should stay off her feet and use ice packs to the tendon.  If it is  still bothering her next week, she should go in and see Dr. Cato Mulligan about  it.  Otherwise, I think she probably needs more antibiotics to clear up  her respiratory infection.  Therefore, I called in a 7-day course of  doxycycline 100 mg to take b.i.d. to see [facility] at [phone].  If she any  further problems over the weekend, she can call me back.     Tera Mater. Clent Ridges, MD  Electronically Signed    SAF/MedQ  DD: 06/05/2006  DT: 06/05/2006  Job #: [id number]

## 2010-08-01 NOTE — Procedures (Signed)
CLINICAL HISTORY:  This is a 65 year old lady with history of migraines.   MEDICATIONS:  Atenolol, Nexium, Neurontin.   This is a 17-channel EEG performed during wakeful state only, using standard  10-20 electrode placement.   Background awake rhythm consists of 10 hertz alpha which is of normal  amplitude, synchronized and reactive to eye opening and closure. No  paroxysmal epileptiform activity, spikes or sharp waves are seen.  No  diffuse focal electrophysiologic abnormalities were seen.  Changes of  definite sleep are not seen in this tracing.  Length of tracing is 23.4  minutes.  Technical component is adequate.  EKG tracing is regular sinus  rhythm.  Hyperventilation and photic stimulation revealed no significant  abnormalities.   IMPRESSION:  EEG performed during wakeful state is within normal limits.  No  definite epileptiform activities identified.      UJW:[id number]  D:  12/25/2003 18:41:51  T:  12/26/2003 06:38:19  Job #:  [id number]

## 2010-08-01 NOTE — Op Note (Signed)
[facility]  Patient:    Lacey Salas, Lacey Salas Visit Number: [id number] MRN: [id number]          Service Type: GYN Location: 9300 9399 [id number] Attending Physician:  Minette Headland Dictated by:   Freddy Finner, M.D. Proc. Date: 09/29/01 Admit Date:  09/29/2001                             Operative Report  PREOPERATIVE DIAGNOSES:       1. Uterine prolapse.                               2. Cystocele.                               3. Rectocele.  POSTOPERATIVE DIAGNOSES:      1. Uterine prolapse.                               2. Cystocele.                               3. Rectocele.  OPERATIVE PROCEDURE:          1. Total vaginal hysterectomy.                               2. Bilateral salpingo-oophorectomy.                               3. Anterior and posterior vaginal repair.  SURGEON:                      Freddy Finner, M.D.  ASSISTANT:                    _______  ANESTHESIA:                   Spinal.  ESTIMATED BLOOD LOSS:         300 cc.  INTRAOPERATIVE COMPLICATIONS:                None.  DESCRIPTION OF PROCEDURE:     The patient is a 65 year old with symptomatic pelvic relaxation who was admitted on the morning of surgery.  She received Cipro 400 mg IV preoperatively.  She was placed in PIS hose.  She was brought to the operating room and there placed under adequate spinal anesthesia, placed in the dorsal lithotomy position using the [place] stirrups system.  A Betadine prep with scrub followed by solution was carried out in the usual fashion.  A posterior weighted vaginal retractor was placed.  The cervix was grasped with a Christella Hartigan tenaculum.  Colpotomy incision was made while tenting the mucosa posterior to the cervix with an Allis.  The cervix was circumscribed with a scalpel.  The bladder was carefully advanced off the cervix.  Using the LigaSure system the uterosacrals and bladder pillars were controlled, sealed and divided.  The  uterus was inverted to identify the anterior peritoneal fold which was entered sharply.  The LigaSure system was then used to develop the cardinal ligament pedicles and  the uterine artery pedicles, each was sealed and divided.  The uterus was then delivered through the vaginal introitus.  The infundibulopelvic ligaments on each side were secured with a LigaSure, clamped and sealed and divided.  The angles of the vagina were then anchored to the uterosacrals with mattress sutures of 0 Monocryl.  The uterosacrals were plicated with interrupted 0 Monocryl sutures; this was also used to close the posterior peritoneum.  The posterior 2/3rds of the cuff was then closed vertically with figure-of-eights of 0 Monocryl.  The anterior edges of the mucosa were grasped, the mucosa overlying the bladder was tented with an Allis and with careful sharp and blunt dissection the bladder was dissected free of the mucosa and the mucosa incised in the midline.  After carefully separating the bladder and vesicovaginal fascia, two plication sutures were placed to reduce the cystocele and to elevate the urethrovesical angle.  The urethral length and urethral patency were checked with a sterile Tresa Endo.  The urethral length was at least 2.5 cm.  Small segments of mucosa were removed and the cuff was then completely closed with a figure-of-eight of 0 Monocryl.  The anterior mucosal incision was closed with a running 2-0 Monocryl suture.  Attention was then turned posteriorly.  The fourchette was grasped on each side with Allis, the pyramidal segment of skin excised from the perineal body with apex inferiorly.  The mucosa overlying the rectum was then developed with careful sharp and blunt dissection.  An incision again was made in the midline, the perirectal tissues were then plicated using 0 Monocryl sutures.  The levators were plicated and the vagina lengthened with the levator suture of 0 Monocryl.  The perineal  body was closed with a 0 Monocryl.  The mucosa was then closed with a running 2-0 Monocryl in a fashion similar to episiotomy.  A Foley catheter was then placed and the bladder was filled with 300 cc of sterile solution.  A suprapubic catheter was placed without difficulty and anchored to the skin with nylon sutures.  Two-inch iodoform gauze was placed in the vagina.  The procedure was terminated.  The patient was taken to the recovery room in good condition. Dictated by:   Freddy Finner, M.D. Attending Physician:  Minette Headland DD:  09/29/01 TD:  09/29/01 Job: [id number]

## 2010-08-01 NOTE — Consult Note (Signed)
[name], MCCOMBS NO.:  [id number]   MEDICAL RECORD NO.:  [id number]          PATIENT TYPE:  INP   LOCATION:  3016                         FACILITY:  [facility]   PHYSICIAN:  Genene Churn. Love, M.D.    DATE OF BIRTH:  Dec 16, 1945   DATE OF CONSULTATION:  12/24/2003  DATE OF DISCHARGE:                                   CONSULTATION   This 65 year old right-handed married white female is seen for evaluation of  visual disturbance and bilateral lower extremity weakness right greater than  left.   HISTORY OF PRESENT ILLNESS:  [name] has no known history of high blood  pressure, diabetes, heart disease, stroke or cigarette use.  She has a  greater than 30-year history of migraine headaches characterized by visual  disturbance, initially with scotomata and followed by headaches.  She has  been followed by Dr. Clabe Seal. Adelman, and Dr. Pearson Grippe and has also been seen  by Dr. Orlin Hilding.  An MRI study in the past has shown evidence of bilateral  white matter changes and she has been evaluated by Dr. Leonides Cave,  neuropsychologist, without any definite evidence of dementia.  She had  developed a sulfonamide reaction with Stevens-Johnson syndrome and at that  time was tapered off of atenolol.  She had no significant weakness in lower  extremities prior to that time but with her episodes of migraine she  thereafter developed difficulty with weakness in her legs, usually right  greater than left.  On Saturday, December 22, 2003, she developed weakness in  her legs lasting several hours without associated headache and today while  at school developed blurred vision involving both eyes which was a new type  of blurred vision for her followed by weakness in her lower extremities,  right greater than left.  The blurred vision lasted approximately two to  three minutes and the weakness lasted for a couple of hours.  She drove by  Dr. Cato Mulligan' office and he admitted her for further  evaluation.   PAST MEDICAL HISTORY:  1.  Stevens-Johnson syndrome on sulfonamides.  2.  RSD after a wrist fracture in December of 2004 treated currently with      Neurontin.  3.  Longstanding migraine headaches.  4.  Hysterectomy.   MEDICATIONS:  1.  Atenolol.  2.  Nexium.  3.  Neurontin taking 100 mg twice a day and 200 mg at night.   PHYSICAL EXAMINATION:  GENERAL APPEARANCE:  A well-developed white female in  no acute distress.  VITAL SIGNS:  Blood pressure right and left arm 160/80, heart rate 80 and  regular, there were no bruits.  NEUROLOGIC:  Mental status:  She was alert and oriented x3.  Cranial nerve  examination revealed visual fields full.  Discs flat.  Extraocular movements  full.  Corneals present.  No seventh nerve palsy.  Hearing intact.  Air  conduction greater than bone conduction.  Tongue midline, uvula midline.  Gags present.  Sternocleidomastoid and trapezius testing normal.  Motor  examination with good strength upper and lower extremities.  No evidence of  proximal pronator or distal drift and coordination testing normal.  Sensory  examination was intact.  Deep tendon reflexes 1 to 2+.  Plantar responses  were downgoing.   IMPRESSION:  1.  Complex migraine, code 346.10.  2.  History of Stevens-Johnson syndrome on sulfonamides.  3.  Hysterectomy.   PLAN:  In view of her history of Stevens-Johnson syndrome, not to proceed  with added medications but to continue to follow her with diet for migraine.       JML/MEDQ  D:  12/24/2003  T:  12/25/2003  Job:  [id number]

## 2010-08-01 NOTE — Procedures (Signed)
[facility]  Patient:    Lacey, Salas Visit Number: [id number] MRN: [id number]          Service Type: END Location: ENDO Attending Physician:  Orland Mustard Dictated by:   Llana Aliment. Randa Evens, M.D. Proc. Date: 09/19/01 Admit Date:  09/19/2001 Discharge Date: 09/19/2001   CC:         Gretta Arab. Valentina Lucks, M.D.   Procedure Report  DATE OF BIRTH:  [birth year]  PROCEDURE:  Esophagogastroduodenoscopy with biopsy.  GASTROENTEROLOGIST:  Llana Aliment. Randa Evens, M.D.  MEDICATIONS:  Cetacaine spray.  The patient received no IV sedation at her request.  INDICATIONS:  Persistent epigastric pain refractory to Protonix.  DESCRIPTION OF PROCEDURE:  The procedure had been explained to the patient and consent obtained.  The patient preferred to have the procedure done with no sedation.  She was sprayed quite good and gargled and seemed to have her gag reflex completely suppressed.  The scope was inserted under direct visualization.  The stomach was entered, pylorus identified and passed.  The duodenum including the bulb and second portion were seen and were unremarkable.  The scope was withdrawn back into the antrum.  The antrum was biopsied for rapid urease test for Helicobacter.  No ulcers or other lesions were seen.   Fundus and cardia were seen on retroflexed view and were normal. The distal esophagus was slightly reddened.  The proximal esophagus was normal.  The scope was withdrawn.  The patient tolerated the procedure well.  ASSESSMENT: Epigastric pain.  Cause not obvious on endoscopy.  Could be reflux.  PLAN:   Will give reflux literature; she already got a copy of this from the office.  Will increase Protonix to b.i.d., and will see her back in the office in six weeks. Dictated by:   Llana Aliment. Randa Evens, M.D. Attending Physician:  Orland Mustard DD:  09/19/01 TD:  09/21/01 Job: [id number]

## 2010-08-23 ENCOUNTER — Other Ambulatory Visit: Payer: Self-pay | Admitting: Internal Medicine

## 2010-08-23 DIAGNOSIS — F329 Major depressive disorder, single episode, unspecified: Secondary | ICD-10-CM

## 2010-08-23 DIAGNOSIS — F32A Depression, unspecified: Secondary | ICD-10-CM

## 2010-08-27 ENCOUNTER — Other Ambulatory Visit: Payer: Self-pay | Admitting: Internal Medicine

## 2010-12-11 ENCOUNTER — Telehealth: Payer: Self-pay | Admitting: Internal Medicine

## 2010-12-11 ENCOUNTER — Other Ambulatory Visit: Payer: Self-pay | Admitting: *Deleted

## 2010-12-11 DIAGNOSIS — I10 Essential (primary) hypertension: Secondary | ICD-10-CM

## 2010-12-11 MED ORDER — ATENOLOL 25 MG PO TABS: 25.0000 mg | ORAL_TABLET | Freq: Every day | ORAL | Status: DC

## 2010-12-11 NOTE — Telephone Encounter (Signed)
rx sent in electronically 

## 2010-12-11 NOTE — Telephone Encounter (Signed)
Pt is out of atenolol 25mg . [facility] [phone]

## 2011-06-08 ENCOUNTER — Other Ambulatory Visit: Payer: Self-pay | Admitting: Internal Medicine

## 2011-06-16 ENCOUNTER — Telehealth: Payer: Self-pay

## 2011-06-16 NOTE — Telephone Encounter (Signed)
A user error has taken place: encounter opened in error, closed for administrative reasons.

## 2011-08-31 ENCOUNTER — Other Ambulatory Visit: Payer: Self-pay | Admitting: Internal Medicine

## 2011-09-07 ENCOUNTER — Other Ambulatory Visit: Payer: Self-pay | Admitting: Internal Medicine

## 2011-09-07 ENCOUNTER — Telehealth: Payer: Self-pay | Admitting: Internal Medicine

## 2011-09-07 NOTE — Telephone Encounter (Signed)
Pt needs a refill on citalopram (CELEXA) 20 MG tablet. Pt states that she can not get a refill until she has an ov. Pt is in transition to Medicare and is wanting to know if she can get a 1 month refill to get her up till August. Pt will be active on medicare 10/15/11. Please advise  [facility]

## 2011-09-08 MED ORDER — CITALOPRAM HYDROBROMIDE 20 MG PO TABS: 20.0000 mg | ORAL_TABLET | Freq: Every day | ORAL | Status: DC

## 2011-09-08 NOTE — Telephone Encounter (Signed)
rx sent in electronically 

## 2011-11-24 ENCOUNTER — Ambulatory Visit (INDEPENDENT_AMBULATORY_CARE_PROVIDER_SITE_OTHER): Payer: Medicare Other | Admitting: Internal Medicine

## 2011-11-24 ENCOUNTER — Encounter: Payer: Self-pay | Admitting: Internal Medicine

## 2011-11-24 ENCOUNTER — Other Ambulatory Visit (INDEPENDENT_AMBULATORY_CARE_PROVIDER_SITE_OTHER): Payer: Medicare Other

## 2011-11-24 VITALS — BP 142/82 | HR 67 | Temp 97.6°F | Ht 63.5 in | Wt 154.4 lb

## 2011-11-24 DIAGNOSIS — G43019 Migraine without aura, intractable, without status migrainosus: Secondary | ICD-10-CM

## 2011-11-24 DIAGNOSIS — Z23 Encounter for immunization: Secondary | ICD-10-CM

## 2011-11-24 DIAGNOSIS — F3289 Other specified depressive episodes: Secondary | ICD-10-CM

## 2011-11-24 DIAGNOSIS — F329 Major depressive disorder, single episode, unspecified: Secondary | ICD-10-CM

## 2011-11-24 DIAGNOSIS — Z0000 Encounter for general adult medical examination without abnormal findings: Secondary | ICD-10-CM

## 2011-11-24 DIAGNOSIS — E785 Hyperlipidemia, unspecified: Secondary | ICD-10-CM

## 2011-11-24 DIAGNOSIS — Z79899 Other long term (current) drug therapy: Secondary | ICD-10-CM

## 2011-11-24 LAB — BASIC METABOLIC PANEL
BUN: 12 mg/dL (ref 6–23)
Chloride: 108 mEq/L (ref 96–112)
Potassium: 3.9 mEq/L (ref 3.5–5.1)

## 2011-11-24 LAB — URINALYSIS, ROUTINE W REFLEX MICROSCOPIC
Bilirubin Urine: NEGATIVE
Total Protein, Urine: NEGATIVE
Urine Glucose: NEGATIVE

## 2011-11-24 LAB — CBC WITH DIFFERENTIAL/PLATELET
Basophils Relative: 0.6 % (ref 0.0–3.0)
Eosinophils Relative: 2.2 % (ref 0.0–5.0)
HCT: 39 % (ref 36.0–46.0)
Lymphs Abs: 2 10*3/uL (ref 0.7–4.0)
MCV: 90.3 fl (ref 78.0–100.0)
Monocytes Absolute: 0.8 10*3/uL (ref 0.1–1.0)
RBC: 4.32 Mil/uL (ref 3.87–5.11)
WBC: 7.4 10*3/uL (ref 4.5–10.5)

## 2011-11-24 LAB — HEPATIC FUNCTION PANEL
Albumin: 4.1 g/dL (ref 3.5–5.2)
Bilirubin, Direct: 0.1 mg/dL (ref 0.0–0.3)
Total Protein: 7.6 g/dL (ref 6.0–8.3)

## 2011-11-24 LAB — TSH: TSH: 1.73 u[IU]/mL (ref 0.35–5.50)

## 2011-11-24 LAB — LIPID PANEL
HDL: 45.3 mg/dL (ref >39.00)
Total CHOL/HDL Ratio: 5
VLDL: 76 mg/dL — ABNORMAL HIGH (ref 0.0–40.0)

## 2011-11-24 MED ORDER — VENLAFAXINE HCL ER 37.5 MG PO CP24: 37.5000 mg | ORAL_CAPSULE | Freq: Every day | ORAL | Status: DC

## 2011-11-24 NOTE — Assessment & Plan Note (Addendum)
Long hx same - previously followed at [facility] - last seen 2011 Manifests with stroke-like symptoms: R facial droop and aphasia, overwhelming fatigue Last MRI/MR brain 06/2008 during hospitalization for same - no abnormality Increasing episodes in past weeks - has previously declined prophylactic meds for same Continue beta-blocker as increase in symptoms when previously tried to DC same Consider topamax if no relief with change in antidepressant - see next

## 2011-11-24 NOTE — Patient Instructions (Signed)
It was good to see you today. We have reviewed your prior records including labs and tests today Health Maintenance reviewed - all recommended immunizations and age-appropriate screenings are up-to-date. Test(s) ordered today. Your results will be called to you after review (48-72hours after test completion). If any changes need to be made, you will be notified at that time. Stop Celexa and start lowest dose Effexor for mood swings and migraine prevention - Your prescription(s) have been submitted to your pharmacy. Please take as directed and contact our office if you believe you are having problem(s) with the medication(s). Other Medications reviewed, no additional changes at this time.  Please schedule followup in 6-8 weeks for medication and mood review, call sooner if problems.  Health Maintenance, Females A healthy lifestyle and preventative care can promote health and wellness.  Maintain regular health, dental, and eye exams.   Eat a healthy diet. Foods like vegetables, fruits, whole grains, low-fat dairy products, and lean protein foods contain the nutrients you need without too many calories. Decrease your intake of foods high in solid fats, added sugars, and salt. Get information about a proper diet from your caregiver, if necessary.   Regular physical exercise is one of the most important things you can do for your health. Most adults should get at least 150 minutes of moderate-intensity exercise (any activity that increases your heart rate and causes you to sweat) each week. In addition, most adults need muscle-strengthening exercises on 2 or more days a week.     Maintain a healthy weight. The body mass index (BMI) is a screening tool to identify possible weight problems. It provides an estimate of body fat based on height and weight. Your caregiver can help determine your BMI, and can help you achieve or maintain a healthy weight. For adults 20 years and older:   A BMI below 18.5 is  considered underweight.   A BMI of 18.5 to 24.9 is normal.   A BMI of 25 to 29.9 is considered overweight.   A BMI of 30 and above is considered obese.   Maintain normal blood lipids and cholesterol by exercising and minimizing your intake of saturated fat. Eat a balanced diet with plenty of fruits and vegetables. Blood tests for lipids and cholesterol should begin at age 44 and be repeated every 5 years. If your lipid or cholesterol levels are high, you are over 50, or you are a high risk for heart disease, you may need your cholesterol levels checked more frequently. Ongoing high lipid and cholesterol levels should be treated with medicines if diet and exercise are not effective.   If you smoke, find out from your caregiver how to quit. If you do not use tobacco, do not start.   If you are pregnant, do not drink alcohol. If you are breastfeeding, be very cautious about drinking alcohol. If you are not pregnant and choose to drink alcohol, do not exceed 1 drink per day. One drink is considered to be 12 ounces (355 mL) of beer, 5 ounces (148 mL) of wine, or 1.5 ounces (44 mL) of liquor.   Avoid use of street drugs. Do not share needles with anyone. Ask for help if you need support or instructions about stopping the use of drugs.   High blood pressure causes heart disease and increases the risk of stroke. Blood pressure should be checked at least every 1 to 2 years. Ongoing high blood pressure should be treated with medicines, if weight loss and exercise  are not effective.   If you are 27 to 65 years old, ask your caregiver if you should take aspirin to prevent strokes.   Diabetes screening involves taking a blood sample to check your fasting blood sugar level. This should be done once every 3 years, after age 70, if you are within normal weight and without risk factors for diabetes. Testing should be considered at a younger age or be carried out more frequently if you are overweight and have at  least 1 risk factor for diabetes.   Breast cancer screening is essential preventative care for women. You should practice "breast self-awareness." This means understanding the normal appearance and feel of your breasts and may include breast self-examination. Any changes detected, no matter how small, should be reported to a caregiver. Women in their 68s and 30s should have a clinical breast exam (CBE) by a caregiver as part of a regular health exam every 1 to 3 years. After age 47, women should have a CBE every year. Starting at age 73, women should consider having a mammogram (breast X-ray) every year. Women who have a family history of breast cancer should talk to their caregiver about genetic screening. Women at a high risk of breast cancer should talk to their caregiver about having an MRI and a mammogram every year.   The Pap test is a screening test for cervical cancer. Women should have a Pap test starting at age 93. Between ages 72 and 23, Pap tests should be repeated every 2 years. Beginning at age 30, you should have a Pap test every 3 years as long as the past 3 Pap tests have been normal. If you had a hysterectomy for a problem that was not cancer or a condition that could lead to cancer, then you no longer need Pap tests. If you are between ages 69 and 33, and you have had normal Pap tests going back 10 years, you no longer need Pap tests. If you have had past treatment for cervical cancer or a condition that could lead to cancer, you need Pap tests and screening for cancer for at least 20 years after your treatment. If Pap tests have been discontinued, risk factors (such as a new sexual partner) need to be reassessed to determine if screening should be resumed. Some women have medical problems that increase the chance of getting cervical cancer. In these cases, your caregiver may recommend more frequent screening and Pap tests.   The human papillomavirus (HPV) test is an additional test that may  be used for cervical cancer screening. The HPV test looks for the virus that can cause the cell changes on the cervix. The cells collected during the Pap test can be tested for HPV. The HPV test could be used to screen women aged 82 years and older, and should be used in women of any age who have unclear Pap test results. After the age of 53, women should have HPV testing at the same frequency as a Pap test.   Colorectal cancer can be detected and often prevented. Most routine colorectal cancer screening begins at the age of 15 and continues through age 37. However, your caregiver may recommend screening at an earlier age if you have risk factors for colon cancer. On a yearly basis, your caregiver may provide home test kits to check for hidden blood in the stool. Use of a small camera at the end of a tube, to directly examine the colon (sigmoidoscopy or colonoscopy), can  detect the earliest forms of colorectal cancer. Talk to your caregiver about this at age 58, when routine screening begins. Direct examination of the colon should be repeated every 5 to 10 years through age 50, unless early forms of pre-cancerous polyps or small growths are found.   Hepatitis C blood testing is recommended for all people born from [birth year] through 1964 and any individual with known risks for hepatitis C.   Practice safe sex. Use condoms and avoid high-risk sexual practices to reduce the spread of sexually transmitted infections (STIs). Sexually active women aged 31 and younger should be checked for Chlamydia, which is a common sexually transmitted infection. Older women with new or multiple partners should also be tested for Chlamydia. Testing for other STIs is recommended if you are sexually active and at increased risk.   Osteoporosis is a disease in which the bones lose minerals and strength with aging. This can result in serious bone fractures. The risk of osteoporosis can be identified using a bone density scan. Women ages  79 and over and women at risk for fractures or osteoporosis should discuss screening with their caregivers. Ask your caregiver whether you should be taking a calcium supplement or vitamin D to reduce the rate of osteoporosis.   Menopause can be associated with physical symptoms and risks. Hormone replacement therapy is available to decrease symptoms and risks. You should talk to your caregiver about whether hormone replacement therapy is right for you.   Use sunscreen with a sun protection factor (SPF) of 30 or greater. Apply sunscreen liberally and repeatedly throughout the day. You should seek shade when your shadow is shorter than you. Protect yourself by wearing long sleeves, pants, a wide-brimmed hat, and sunglasses year round, whenever you are outdoors.   Notify your caregiver of new moles or changes in moles, especially if there is a change in shape or color. Also notify your caregiver if a mole is larger than the size of a pencil eraser.   Stay current with your immunizations.  Document Released: 09/15/2010 Document Revised: 02/19/2011 Document Reviewed: 09/15/2010 [facility] Patient Information 2012 [place], [place].

## 2011-11-24 NOTE — Assessment & Plan Note (Signed)
On celexa for years - increasing mood swings and migraine symptoms  Change to SNRI - effexor - titrate as needed Verified no SI/HI Support offered, follow up in 6-8 weeks

## 2011-11-24 NOTE — Progress Notes (Signed)
Subjective:    Patient ID: Lacey Salas, female    DOB: 08-Mar-1946, 66 y.o.   MRN: [id number]  HPI  New pt to me, known to our division - prior PCP = [facility] - here to establish care  Also here for medicare wellness  Diet: heart healthy  Physical activity: sedentary Depression/mood screen: negative Hearing: intact to whispered voice Visual acuity: grossly normal, performs annual eye exam  ADLs: capable Fall risk: none Home safety: good Cognitive evaluation: intact to orientation, naming, recall and repetition EOL planning: adv directives, full code/ I agree  I have personally reviewed and have noted 1. The patient's medical and social history 2. Their use of alcohol, tobacco or illicit drugs 3. Their current medications and supplements 4. The patient's functional ability including ADL's, fall risks, home safety risks and hearing or visual impairment. 5. Diet and physical activities 6. Evidence for depression or mood disorders  Also reviewed chronic medical issues:  Depression hx - on celexa for years - increasing symptoms of mood swings and irritability, poor sleep - denies SI/HI - no prior med therapies or counseling tried.  Complex migraines - hosp for same 2010 because of stroke like presentation: aphasia and right-sided facial paralysis. As previously followed at [facility] with neurology for same. Local neurologist with Dr. love, last seen in 2011. Reports increasing frequency of attacks bowel approximately once weekly, no change in symptom intensity or manifestation. Takes Celexa and beta blocker for management of same, has previously declined other migraine medications or prophylaxis.  Dyslipidemia - previously prescribed low-dose simvastatin but never began same. Attempts to follow low fat diet but technologist weight gain trend.  Past Medical History  Diagnosis Date  . Anxiety   . Depression   . GERD (gastroesophageal reflux disease)   . Osteopenia   . Migraine    complicated migraines with transient R side facial paralysis and aphasia  . MVP (mitral valve prolapse)   . Stevens-Johnson disease   . Reflex sympathetic dystrophy, unspecified 2011    R hand related to wrist fx, improved s/p nerve blocks  . Hyperlipidemia   . History of chicken pox    Family History  Problem Relation Age of Onset  . Leukemia Brother     CLL  . Arthritis Mother   . Arthritis Father   . Breast cancer Sister   . Arthritis Other   . Ovarian cancer Other   . Hyperlipidemia Other   . Hypertension Other   . Stroke Other    History  Substance Use Topics  . Smoking status: Former Smoker    Types: Cigarettes  . Smokeless tobacco: Not on file   Comment: Quit over 30 years ago  . Alcohol Use: No     Review of Systems Constitutional: Negative for fever or unexpected weight change.  Respiratory: Negative for cough and shortness of breath.   Cardiovascular: Negative for chest pain or palpitations.  Gastrointestinal: Negative for abdominal pain, no bowel changes.  Musculoskeletal: Negative for gait problem or joint swelling.  Skin: Negative for rash.  Neurological: Negative for dizziness or headache.  No other specific complaints in a complete review of systems (except as listed in HPI above).     Objective:   Physical Exam BP 142/82  Pulse 67  Temp 97.6 F (36.4 C) (Oral)  Ht 5' 3.5" (1.613 m)  Wt 154 lb 6.4 oz (70.035 kg)  BMI 26.92 kg/m2  SpO2 95% Wt Readings from Last 3 Encounters:  11/24/11 154 lb 6.4 oz (  70.035 kg)  04/04/10 151 lb (68.493 kg)  08/27/09 150 lb (68.04 kg)   Constitutional: She appears well-developed and well-nourished. No distress.  HENT: Head: Normocephalic and atraumatic. Ears: B TMs ok, no erythema or effusion; Nose: Nose normal. Mouth/Throat: Oropharynx is clear and moist. No oropharyngeal exudate.  Eyes: Conjunctivae and EOM are normal. Pupils are equal, round, and reactive to light. No scleral icterus.  Neck: Normal range of  motion. Neck supple. No JVD present. No thyromegaly present.  Cardiovascular: Normal rate, regular rhythm and normal heart sounds.  No murmur heard. No BLE edema. Pulmonary/Chest: Effort normal and breath sounds normal. No respiratory distress. She has no wheezes.  Abdominal: Soft. Bowel sounds are normal. She exhibits no distension. There is no tenderness. no masses Musculoskeletal: Normal range of motion, no joint effusions. No gross deformities Neurological: Chronic decrease in left side nasolabial fold - no R side facial deficits at this time. She is alert and oriented to person, place, and time. No other cranial nerve deficit. Coordination, speech, balance and recall normal.  Skin: Skin is warm and dry. No rash noted. No erythema.  Psychiatric: She has a dysphoric mood and affect. Her behavior is normal. Judgment and thought content normal.   Lab Results  Component Value Date   WBC 6.6 03/21/2010   HGB 13.0 03/21/2010   HCT 37.9 03/21/2010   PLT 310.0 03/21/2010   GLUCOSE 96 03/21/2010   CHOL 245* 03/21/2010   TRIG 175.0* 03/21/2010   HDL 39.00* 03/21/2010   LDLDIRECT 177.6 03/21/2010   LDLCALC  Value: 155        Total Cholesterol/HDL:CHD Risk Coronary Heart Disease Risk Table                     Men   Women  1/2 Average Risk   3.4   3.3  Average Risk       5.0   4.4  2 X Average Risk   9.6   7.1  3 X Average Risk  23.4   11.0        Use the calculated Patient Ratio above and the CHD Risk Table to determine the patient's CHD Risk.        ATP III CLASSIFICATION (LDL):  <100     mg/dL   Optimal  409-811  mg/dL   Near or Above                    Optimal  130-159  mg/dL   Borderline  914-782  mg/dL   High  >956     mg/dL   Very High* 04/16/3084   ALT 17 03/21/2010   AST 21 03/21/2010   NA 143 03/21/2010   K 4.3 03/21/2010   CL 109 03/21/2010   CREATININE 0.6 03/21/2010   BUN 17 03/21/2010   CO2 25 03/21/2010   TSH 3.72 03/21/2010   INR 1.0 06/14/2008   HGBA1C  Value: 5.4 (NOTE)   The ADA recommends the following therapeutic  goal for glycemic   control related to Hgb A1C measurement:   Goal of Therapy:   < 7.0% Hgb A1C   Reference: American Diabetes Association: Clinical Practice   Recommendations 2008, Diabetes Care,  2008, 31:(Suppl 1). 06/14/2008        Assessment & Plan:   CPX/v70.0 - Today patient counseled on age appropriate routine health concerns for screening and prevention, each reviewed and up to date or declined. Immunizations reviewed and up to date  or declined. Labs/prior ECG reviewed. Risk factors for depression reviewed and addressed. Hearing function and visual acuity are intact. ADLs screened and addressed as needed. Functional ability and level of safety reviewed and appropriate. Education, counseling and referrals performed based on assessed risks today. Patient provided with a copy of personalized plan for preventive services.  Also See problem list. Medications and labs reviewed today.

## 2011-12-10 ENCOUNTER — Other Ambulatory Visit: Payer: Self-pay | Admitting: General Practice

## 2011-12-10 ENCOUNTER — Other Ambulatory Visit: Payer: Self-pay | Admitting: Internal Medicine

## 2011-12-10 MED ORDER — ATENOLOL 25 MG PO TABS: 25.0000 mg | ORAL_TABLET | Freq: Every day | ORAL | Status: DC

## 2011-12-21 ENCOUNTER — Telehealth: Payer: Self-pay | Admitting: Internal Medicine

## 2011-12-21 MED ORDER — CLONAZEPAM 0.5 MG PO TABS: 0.5000 mg | ORAL_TABLET | Freq: Two times a day (BID) | ORAL | Status: DC | PRN

## 2011-12-21 NOTE — Telephone Encounter (Signed)
Caller: Nea/Patient; Phone: [phone]; Reason for Call: Patient calling to see if Dr.  Felicity Coyer would call her in something for 2 to 3 days to help calm her nerves.  States has had some bad news, she is leaving in 2 hours to go be with her mother.  Please call her back.  Thanks

## 2011-12-21 NOTE — Telephone Encounter (Signed)
Okay for Clonazepam to use as needed for anxiety symptoms

## 2011-12-21 NOTE — Telephone Encounter (Signed)
Rx for Clonazepam faxed to [facility] in [place], [place] per husband.

## 2011-12-24 ENCOUNTER — Telehealth: Payer: Self-pay | Admitting: Internal Medicine

## 2011-12-24 NOTE — Telephone Encounter (Signed)
Caller: Daniel/Spouse; Patient Name: Lacey Salas; PCP: Rene Paci (Adults only); Best Callback Phone Number: [phone]; Reason for call: Mood swings/anger fits.  Per Reuel Boom pt is experiencing, severe mood swings, fits of anger, muscle twitches, and scratches her head until it bleeds.  Spouse asked me to call pt @ [phone].  Pt verified the above symptoms. Pt wants to know if she should go back to the Citalopram at a higher dose?  Pt having external stressors and away from home.  All emergent symptoms ruled out with exception to ' New or increasing symptoms and taking medications / following therapy as prescribed'.  Call Provider within 24 hrs. Advised pt of ED and she said she can come into town for an appt.  Appt scheduled 12-25-11 @ 0900 with Dr. Felicity Coyer.

## 2011-12-25 ENCOUNTER — Encounter: Payer: Self-pay | Admitting: Internal Medicine

## 2011-12-25 ENCOUNTER — Ambulatory Visit (INDEPENDENT_AMBULATORY_CARE_PROVIDER_SITE_OTHER): Payer: Medicare Other | Admitting: Internal Medicine

## 2011-12-25 VITALS — BP 122/88 | HR 69 | Temp 98.3°F | Ht 63.5 in | Wt 152.0 lb

## 2011-12-25 DIAGNOSIS — F329 Major depressive disorder, single episode, unspecified: Secondary | ICD-10-CM

## 2011-12-25 DIAGNOSIS — F3289 Other specified depressive episodes: Secondary | ICD-10-CM

## 2011-12-25 DIAGNOSIS — G43019 Migraine without aura, intractable, without status migrainosus: Secondary | ICD-10-CM

## 2011-12-25 MED ORDER — CITALOPRAM HYDROBROMIDE 20 MG PO TABS: 20.0000 mg | ORAL_TABLET | Freq: Every day | ORAL | Status: DC

## 2011-12-25 MED ORDER — NORTRIPTYLINE HCL 25 MG PO CAPS: 25.0000 mg | ORAL_CAPSULE | Freq: Every day | ORAL | Status: DC

## 2011-12-25 NOTE — Assessment & Plan Note (Signed)
On celexa for years - increasing mood swings and migraine symptoms  Tried change to SNRI 11/2011 - effexor - symptoms worse? (also exacerbated by new family stressors: mom with breast ca dx) Resume celexa 20 and add nortriptyline qhs (may also help migraines - see next) Verified no SI/HI Encouraged use of BZ - klonopin prn to help myoclonic head jerks and stress managment Support offered, follow up in 6-8 weeks

## 2011-12-25 NOTE — Patient Instructions (Signed)
It was good to see you today. We have reviewed your interval medical events today Stop effexor now resume Celexa 20 mg daily and start nortriptyline each evening for sleep, mood and migraine management -  Continue clonazepam as needed for jerking, anxiety and stress management  Your prescription(s) have been submitted to your pharmacy. Please take as directed and contact our office if you believe you are having problem(s) with the medication(s). Other Medications reviewed, no additional changes at this time.  Please schedule followup in 6 weeks for medication and mood review, call sooner if problems.

## 2011-12-25 NOTE — Assessment & Plan Note (Signed)
Long hx same - previously followed at [facility] - last seen 2011 Manifests with stroke-like symptoms: R facial droop and aphasia, overwhelming fatigue Increased symptoms with increase stressors fall 2013 reviewed - elderly parents in [place], mom with breast ca dx Last MRI/MR brain 06/2008 during hospitalization for same - no abnormality Add nortriptyline for sleep now - see depression above Continue beta-blocker as increase in symptoms when previously tried to DC same Consider topamax if no relief with change in antidepressant - see above

## 2011-12-25 NOTE — Telephone Encounter (Signed)
Noted thanks °

## 2011-12-25 NOTE — Progress Notes (Signed)
Subjective:    Patient ID: Lacey Salas, female    DOB: 09/23/1945, 66 y.o.   MRN: [id number]  HPI  Here for increasing anxiety and depression symptoms   also reviewed chronic medical issues:  Depression hx - see above - on celexa for years, has been on low-dose venlafaxine for one month but persisting mood swings and irritability, poor sleep, exacerbated by new stressors (mother recently diagnosed with breast cancer) - denies SI/HI - no prior med therapies or counseling tried.  Complex migraines - hosp for same 2010 because of stroke like presentation: aphasia and right-sided facial paralysis. As previously followed at [facility] with neurology for same. Local neurologist with Dr. love, last seen in 2011. Reports increasing frequency of attacks bowel approximately once weekly, no change in symptom intensity or manifestation. Takes antidepressants and beta blocker for management of same, has previously declined other migraine medications or prophylaxis.  Dyslipidemia - previously prescribed low-dose simvastatin but never began same. Attempts to follow low fat diet but technologist weight gain trend.  Past Medical History  Diagnosis Date  . Anxiety   . Depression   . GERD (gastroesophageal reflux disease)   . Osteopenia   . Migraine     complicated migraines with transient R side facial paralysis and aphasia  . MVP (mitral valve prolapse)   . Stevens-Johnson disease   . Reflex sympathetic dystrophy, unspecified 2011    R hand related to wrist fx, improved s/p nerve blocks  . Hyperlipidemia   . History of chicken pox      Review of Systems  Constitutional: Negative for fever or unexpected weight change.  Respiratory: Negative for cough and shortness of breath.   Cardiovascular: Negative for chest pain or palpitations.      Objective:   Physical Exam  BP 122/88  Pulse 69  Temp 98.3 F (36.8 C) (Oral)  Ht 5' 3.5" (1.613 m)  Wt 152 lb (68.947 kg)  BMI 26.50 kg/m2  SpO2 96% Wt  Readings from Last 3 Encounters:  12/25/11 152 lb (68.947 kg)  11/24/11 154 lb 6.4 oz (70.035 kg)  04/04/10 151 lb (68.493 kg)   Constitutional: She appears well-developed and well-nourished. No distress.  myoclonic jerking "tic" of right side neck when describing stressful events Eyes: Conjunctivae and EOM are normal. Pupils are equal, round, and reactive to light. No scleral icterus.  Neck: Normal range of motion. Neck supple. No JVD present. No thyromegaly present.  Cardiovascular: Normal rate, regular rhythm and normal heart sounds.  No murmur heard. No BLE edema. Pulmonary/Chest: Effort normal and breath sounds normal. No respiratory distress. She has no wheezes.  Neurological: Chronic decrease in left side nasolabial fold - R side facial deficits present at this time. She is alert and oriented to person, place, and time. Stuttering speech during tic -No other cranial nerve deficit. Coordination, balance and recall normal.  Skin: Skin is warm and dry. No rash noted. No erythema.  Psychiatric: She has a dysphoric mood and anxious affect. Her behavior is normal. Judgment and thought content normal.   Lab Results  Component Value Date   WBC 7.4 11/24/2011   HGB 13.0 11/24/2011   HCT 39.0 11/24/2011   PLT 279.0 11/24/2011   GLUCOSE 100* 11/24/2011   CHOL 239* 11/24/2011   TRIG 380.0* 11/24/2011   HDL 45.30 11/24/2011   LDLDIRECT 141.9 11/24/2011   LDLCALC  Value: 155        Total Cholesterol/HDL:CHD Risk Coronary Heart Disease Risk Table  Men   Women  1/2 Average Risk   3.4   3.3  Average Risk       5.0   4.4  2 X Average Risk   9.6   7.1  3 X Average Risk  23.4   11.0        Use the calculated Patient Ratio above and the CHD Risk Table to determine the patient's CHD Risk.        ATP III CLASSIFICATION (LDL):  <100     mg/dL   Optimal  161-096  mg/dL   Near or Above                    Optimal  130-159  mg/dL   Borderline  045-409  mg/dL   High  >811     mg/dL   Very High* 11/14/4780    ALT 17 11/24/2011   AST 21 11/24/2011   NA 140 11/24/2011   K 3.9 11/24/2011   CL 108 11/24/2011   CREATININE 0.3* 11/24/2011   BUN 12 11/24/2011   CO2 24 11/24/2011   TSH 1.73 11/24/2011   INR 1.0 06/14/2008   HGBA1C  Value: 5.4 (NOTE)   The ADA recommends the following therapeutic goal for glycemic   control related to Hgb A1C measurement:   Goal of Therapy:   < 7.0% Hgb A1C   Reference: American Diabetes Association: Clinical Practice   Recommendations 2008, Diabetes Care,  2008, 31:(Suppl 1). 06/14/2008        Assessment & Plan:   See problem list. Medications and labs reviewed today.

## 2012-01-13 ENCOUNTER — Ambulatory Visit: Payer: Medicare Other | Admitting: Internal Medicine

## 2012-01-14 ENCOUNTER — Other Ambulatory Visit: Payer: Self-pay | Admitting: Internal Medicine

## 2012-01-14 DIAGNOSIS — Z1231 Encounter for screening mammogram for malignant neoplasm of breast: Secondary | ICD-10-CM

## 2012-01-14 DIAGNOSIS — Z803 Family history of malignant neoplasm of breast: Secondary | ICD-10-CM

## 2012-02-05 ENCOUNTER — Ambulatory Visit (INDEPENDENT_AMBULATORY_CARE_PROVIDER_SITE_OTHER): Payer: Medicare Other | Admitting: Internal Medicine

## 2012-02-05 ENCOUNTER — Encounter: Payer: Self-pay | Admitting: Internal Medicine

## 2012-02-05 VITALS — BP 118/82 | HR 75 | Temp 97.6°F | Ht 63.5 in | Wt 154.1 lb

## 2012-02-05 DIAGNOSIS — M5430 Sciatica, unspecified side: Secondary | ICD-10-CM

## 2012-02-05 DIAGNOSIS — F329 Major depressive disorder, single episode, unspecified: Secondary | ICD-10-CM

## 2012-02-05 DIAGNOSIS — G43019 Migraine without aura, intractable, without status migrainosus: Secondary | ICD-10-CM

## 2012-02-05 DIAGNOSIS — M5432 Sciatica, left side: Secondary | ICD-10-CM

## 2012-02-05 MED ORDER — CYCLOBENZAPRINE HCL 5 MG PO TABS: 5.0000 mg | ORAL_TABLET | Freq: Three times a day (TID) | ORAL | Status: DC | PRN

## 2012-02-05 MED ORDER — PREDNISONE (PAK) 10 MG PO TABS: 10.0000 mg | ORAL_TABLET | ORAL | Status: DC

## 2012-02-05 NOTE — Progress Notes (Signed)
Subjective:    Patient ID: Lacey Salas, female    DOB: 1946-03-10, 66 y.o.   MRN: [id number]  HPI  Here for follow up - anxiety and depression symptoms  Changed effexor to celexa 12/2011  also reviewed chronic medical issues:  Depression hx - see above - on celexa for years, tried low-dose venlafaxine x 1 month but increased mood swings and irritability, poor sleep, exacerbated by new stressors (mother diagnosed with breast cancer 9/13) - denies SI/HI - no prior med therapies or counseling tried.  Complex migraines - hosp for same 2010 because of stroke like presentation: aphasia and right-sided facial paralysis. As previously followed at [facility] with neurology for same. Local neurologist with Dr. love, last seen in 2011. Reports increasing frequency of attacks when under stress, improved with decreased family illness crisis since 12/2011 - no change in symptom intensity or manifestation. Takes antidepressants and beta blocker for management of same, has previously declined other migraine medications or prophylaxis.  Dyslipidemia - previously prescribed low-dose simvastatin but never began same. Attempts to follow low fat diet but technologist weight gain trend.  Past Medical History  Diagnosis Date  . Anxiety   . Depression   . GERD (gastroesophageal reflux disease)   . Osteopenia   . Migraine     complicated migraines with transient R side facial paralysis and aphasia  . MVP (mitral valve prolapse)   . Stevens-Johnson disease   . Reflex sympathetic dystrophy, unspecified 2011    R hand related to wrist fx, improved s/p nerve blocks  . Hyperlipidemia   . History of chicken pox      Review of Systems  Constitutional: Negative for fever or unexpected weight change.  Respiratory: Negative for cough and shortness of breath.   Cardiovascular: Negative for chest pain or palpitations.      Objective:   Physical Exam  BP 118/82  Pulse 75  Temp 97.6 F (36.4 C) (Oral)  Ht 5' 3.5"  (1.613 m)  Wt 154 lb 1.9 oz (69.908 kg)  BMI 26.87 kg/m2  SpO2 96% Wt Readings from Last 3 Encounters:  02/05/12 154 lb 1.9 oz (69.908 kg)  12/25/11 152 lb (68.947 kg)  11/24/11 154 lb 6.4 oz (70.035 kg)   Constitutional: She appears well-developed and well-nourished. No distress.  myoclonic jerking "tic" of right side neck when describing stressful events Eyes: Conjunctivae and EOM are normal. Pupils are equal, round, and reactive to light. No scleral icterus.  Neck: Normal range of motion. Neck supple. No JVD present. No thyromegaly present.  Cardiovascular: Normal rate, regular rhythm and normal heart sounds.  No murmur heard. No BLE edema. Pulmonary/Chest: Effort normal and breath sounds normal. No respiratory distress. She has no wheezes.  Neurological: Chronic decrease in left side nasolabial fold - R side facial deficits present at this time. She is alert and oriented to person, place, and time. Stuttering speech during tic -No other cranial nerve deficit. Coordination, balance and recall normal.  MSkel: Back: full range of motion of thoracic and lumbar spine. Non tender to palpation. Negative straight leg raise. DTR's are symmetrically intact. Sensation intact in all dermatomes of the lower extremities. Full strength to manual muscle testing. patient is able to heel toe walk without difficulty and ambulates with antalgic gait. Skin: Skin is warm and dry. No rash noted. No erythema.  Psychiatric: She has a dysphoric mood and anxious affect. Her behavior is normal. Judgment and thought content normal.   Lab Results  Component Value Date  WBC 7.4 11/24/2011   HGB 13.0 11/24/2011   HCT 39.0 11/24/2011   PLT 279.0 11/24/2011   GLUCOSE 100* 11/24/2011   CHOL 239* 11/24/2011   TRIG 380.0* 11/24/2011   HDL 45.30 11/24/2011   LDLDIRECT 141.9 11/24/2011   LDLCALC  Value: 155        Total Cholesterol/HDL:CHD Risk Coronary Heart Disease Risk Table                     Men   Women  1/2 Average Risk    3.4   3.3  Average Risk       5.0   4.4  2 X Average Risk   9.6   7.1  3 X Average Risk  23.4   11.0        Use the calculated Patient Ratio above and the CHD Risk Table to determine the patient's CHD Risk.        ATP III CLASSIFICATION (LDL):  <100     mg/dL   Optimal  161-096  mg/dL   Near or Above                    Optimal  130-159  mg/dL   Borderline  045-409  mg/dL   High  >811     mg/dL   Very High* 11/14/4780   ALT 17 11/24/2011   AST 21 11/24/2011   NA 140 11/24/2011   K 3.9 11/24/2011   CL 108 11/24/2011   CREATININE 0.3* 11/24/2011   BUN 12 11/24/2011   CO2 24 11/24/2011   TSH 1.73 11/24/2011   INR 1.0 06/14/2008   HGBA1C  Value: 5.4 (NOTE)   The ADA recommends the following therapeutic goal for glycemic   control related to Hgb A1C measurement:   Goal of Therapy:   < 7.0% Hgb A1C   Reference: American Diabetes Association: Clinical Practice   Recommendations 2008, Diabetes Care,  2008, 31:(Suppl 1). 06/14/2008        Assessment & Plan:   See problem list. Medications and labs reviewed today.  L sciatica -Pred taper, muscle relaxer and exercises - pt to call if worse or unimproved

## 2012-02-05 NOTE — Assessment & Plan Note (Signed)
Long hx same - previously followed at [facility], the [facility] neuro - last seen 2011 Manifests with stroke-like symptoms: R facial droop and aphasia, overwhelming fatigue Increased symptoms with increase stressors fall 2013 reviewed - elderly parents in [place], mom with breast ca dx Last MRI/MR brain 06/2008 during hospitalization for same - no abnormality Intol nortriptyline trial 12/2011 Continue beta-blocker and consider topamax if no relief with change in antidepressant - see above

## 2012-02-05 NOTE — Patient Instructions (Signed)
It was good to see you today. We have reviewed your prior records including labs and tests today Medications reviewed and updated Take Pred taper for next 6 days an flexeril muscle relaxer at night for next 6 nights to help sciatica leg pain symptoms - Your prescription(s) have been submitted to your pharmacy. Please take as directed and contact our office if you believe you are having problem(s) with the medication(s). Sciatica Sciatica is pain, weakness, numbness, or tingling along the path of the sciatic nerve. The nerve starts in the lower back and runs down the back of each leg. The nerve controls the muscles in the lower leg and in the back of the knee, while also providing sensation to the back of the thigh, lower leg, and the sole of your foot. Sciatica is a symptom of another medical condition. For instance, nerve damage or certain conditions, such as a herniated disk or bone spur on the spine, pinch or put pressure on the sciatic nerve. This causes the pain, weakness, or other sensations normally associated with sciatica. Generally, sciatica only affects one side of the body. CAUSES    Herniated or slipped disc.   Degenerative disk disease.   A pain disorder involving the narrow muscle in the buttocks (piriformis syndrome).   Pelvic injury or fracture.   Pregnancy.   Tumor (rare).  SYMPTOMS   Symptoms can vary from mild to very severe. The symptoms usually travel from the low back to the buttocks and down the back of the leg. Symptoms can include:  Mild tingling or dull aches in the lower back, leg, or hip.   Numbness in the back of the calf or sole of the foot.   Burning sensations in the lower back, leg, or hip.   Sharp pains in the lower back, leg, or hip.   Leg weakness.   Severe back pain inhibiting movement.  These symptoms may get worse with coughing, sneezing, laughing, or prolonged sitting or standing. Also, being overweight may worsen symptoms. DIAGNOSIS   Your  caregiver will perform a physical exam to look for common symptoms of sciatica. He or she may ask you to do certain movements or activities that would trigger sciatic nerve pain. Other tests may be performed to find the cause of the sciatica. These may include:  Blood tests.   X-rays.   Imaging tests, such as an MRI or CT scan.  TREATMENT   Treatment is directed at the cause of the sciatic pain. Sometimes, treatment is not necessary and the pain and discomfort goes away on its own. If treatment is needed, your caregiver may suggest:  Over-the-counter medicines to relieve pain.   Prescription medicines, such as anti-inflammatory medicine, muscle relaxants, or narcotics.   Applying heat or ice to the painful area.   Steroid injections to lessen pain, irritation, and inflammation around the nerve.   Reducing activity during periods of pain.   Exercising and stretching to strengthen your abdomen and improve flexibility of your spine. Your caregiver may suggest losing weight if the extra weight makes the back pain worse.   Physical therapy.   Surgery to eliminate what is pressing or pinching the nerve, such as a bone spur or part of a herniated disk.  HOME CARE INSTRUCTIONS    Only take over-the-counter or prescription medicines for pain or discomfort as directed by your caregiver.   Apply ice to the affected area for 20 minutes, 3 4 times a day for the first 48 72 hours.  Then try heat in the same way.   Exercise, stretch, or perform your usual activities if these do not aggravate your pain.   Attend physical therapy sessions as directed by your caregiver.   Keep all follow-up appointments as directed by your caregiver.   Do not wear high heels or shoes that do not provide proper support.   Check your mattress to see if it is too soft. A firm mattress may lessen your pain and discomfort.  SEEK IMMEDIATE MEDICAL CARE IF:    You lose control of your bowel or bladder (incontinence).    You have increasing weakness in the lower back, pelvis, buttocks, or legs.   You have redness or swelling of your back.   You have a burning sensation when you urinate.   You have pain that gets worse when you lie down or awakens you at night.   Your pain is worse than you have experienced in the past.   Your pain is lasting longer than 4 weeks.   You are suddenly losing weight without reason.  MAKE SURE YOU:  Understand these instructions.   Will watch your condition.   Will get help right away if you are not doing well or get worse.  Document Released: 02/24/2001 Document Revised: 09/01/2011 Document Reviewed: 07/12/2011 [facility] Patient Information 2013 [place], [place].   Back Exercises Back exercises help treat and prevent back injuries. The goal is to increase your strength in your belly (abdominal) and back muscles. These exercises can also help with flexibility. Start these exercises when told by your doctor. HOME CARE Back exercises include: Pelvic Tilt.  Lie on your back with your knees bent. Tilt your pelvis until the lower part of your back is against the floor. Hold this position 5 to 10 sec. Repeat this exercise 5 to 10 times.  Knee to Chest.  Pull 1 knee up against your chest and hold for 20 to 30 seconds. Repeat this with the other knee. This may be done with the other leg straight or bent, whichever feels better. Then, pull both knees up against your chest.  Sit-Ups or Curl-Ups.  Bend your knees 90 degrees. Start with tilting your pelvis, and do a partial, slow sit-up. Only lift your upper half 30 to 45 degrees off the floor. Take at least 2 to 3 seonds for each sit-up. Do not do sit-ups with your knees out straight. If partial sit-ups are difficult, simply do the above but with only tightening your belly (abdominal) muscles and holding it as told.  Hip-Lift.  Lie on your back with your knees flexed 90 degrees. Push down with your feet and shoulders as you  raise your hips 2 inches off the floor. Hold for 10 seconds, repeat 5 to 10 times.  Back Arches.  Lie on your stomach. Prop yourself up on bent elbows. Slowly press on your hands, causing an arch in your low back. Repeat 3 to 5 times.  Shoulder-Lifts.  Lie face down with arms beside your body. Keep hips and belly pressed to floor as you slowly lift your head and shoulders off the floor.  Do not overdo your exercises. Be careful in the beginning. Exercises may cause you some mild back discomfort. If the pain lasts for more than 15 minutes, stop the exercises until you see your doctor. Improvement with exercise for back problems is slow.   Document Released: 04/04/2010 Document Revised: 05/25/2011 Document Reviewed: 01/01/2011 [facility] Patient Information 2013 [place], [place].

## 2012-02-05 NOTE — Assessment & Plan Note (Signed)
On celexa for years - increasing mood swings and migraine symptoms due to family stressors 11/2011 Trial SNRI 11/2011 - effexor x 1 mo, then resumed celexa 20 - now improved due to "less stess" Intol of nortriptyline qhs  12/2011: fast HR side effects  Verified no SI/HI Encouraged klonopin prn to help myoclonic head jerks and stress management The current medical regimen is effective;  continue present plan and medications.

## 2012-02-17 ENCOUNTER — Encounter: Payer: Self-pay | Admitting: Internal Medicine

## 2012-02-17 ENCOUNTER — Other Ambulatory Visit (INDEPENDENT_AMBULATORY_CARE_PROVIDER_SITE_OTHER): Payer: Medicare Other

## 2012-02-17 ENCOUNTER — Ambulatory Visit (INDEPENDENT_AMBULATORY_CARE_PROVIDER_SITE_OTHER)
Admission: RE | Admit: 2012-02-17 | Discharge: 2012-02-17 | Disposition: A | Discharge status: Home / Self Care | Payer: Medicare Other | Source: Ambulatory Visit | Attending: Internal Medicine | Admitting: Internal Medicine

## 2012-02-17 ENCOUNTER — Ambulatory Visit (INDEPENDENT_AMBULATORY_CARE_PROVIDER_SITE_OTHER): Payer: Medicare Other | Admitting: Internal Medicine

## 2012-02-17 ENCOUNTER — Other Ambulatory Visit: Payer: Self-pay | Admitting: Internal Medicine

## 2012-02-17 VITALS — BP 138/90 | HR 72 | Temp 97.4°F | Ht 63.5 in | Wt 153.0 lb

## 2012-02-17 DIAGNOSIS — M545 Low back pain, unspecified: Secondary | ICD-10-CM

## 2012-02-17 DIAGNOSIS — M79604 Pain in right leg: Secondary | ICD-10-CM

## 2012-02-17 DIAGNOSIS — K8020 Calculus of gallbladder without cholecystitis without obstruction: Secondary | ICD-10-CM

## 2012-02-17 LAB — URINALYSIS, ROUTINE W REFLEX MICROSCOPIC
Ketones, ur: NEGATIVE
Specific Gravity, Urine: 1.02 (ref 1.000–1.030)
Urine Glucose: NEGATIVE
Urobilinogen, UA: 0.2 (ref 0.0–1.0)

## 2012-02-17 MED ORDER — TRAMADOL HCL 50 MG PO TABS: 50.0000 mg | ORAL_TABLET | Freq: Three times a day (TID) | ORAL | Status: DC | PRN

## 2012-02-17 NOTE — Patient Instructions (Addendum)

## 2012-02-17 NOTE — Progress Notes (Signed)
Subjective:    Patient ID: Lacey Salas, female    DOB: July 08, 1945, 66 y.o.   MRN: [id number]  HPI  Pt presents to the clinic today with c/o of back pain with numbness and tingling going down the backs of both of her legs. She saw Dr. Felicity Coyer the week of thanksgiving for the same. She was given a prednisone taper and flexeril. She never took the flexeril but feels like the prednisone taper did not help. The pain is more sever ow 7/10 and constant. It is only mildly relieved with the tylenol. She denis having an injury to the area. It is worse when she tries to move or bend.  Review of Systems  Past Medical History  Diagnosis Date  . Anxiety   . Depression   . GERD (gastroesophageal reflux disease)   . Osteopenia   . Migraine     complicated migraines with transient R side facial paralysis and aphasia  . MVP (mitral valve prolapse)   . Stevens-Johnson disease   . Reflex sympathetic dystrophy, unspecified 2011    R hand related to wrist fx, improved s/p nerve blocks  . Hyperlipidemia   . History of chicken pox     Current Outpatient Prescriptions  Medication Sig Dispense Refill  . atenolol (TENORMIN) 25 MG tablet Take 1 tablet (25 mg total) by mouth daily.  90 tablet  1  . citalopram (CELEXA) 20 MG tablet Take 1 tablet (20 mg total) by mouth daily.  90 tablet  0  . clonazePAM (KLONOPIN) 0.5 MG tablet Take 1 tablet (0.5 mg total) by mouth 2 (two) times daily as needed for anxiety.  20 tablet  1  . cyclobenzaprine (FLEXERIL) 5 MG tablet Take 1 tablet (5 mg total) by mouth every 8 (eight) hours as needed for muscle spasms.  30 tablet  1  . omeprazole-sodium bicarbonate (ZEGERID) 40-1100 MG per capsule Take 1 capsule by mouth daily before breakfast.          Allergies  Allergen Reactions  . Cephalexin     REACTION: Levonne Spiller  . Epinephrine     REACTION: hypertensive reaction  . Erythromycin     REACTION: Rash  . Lactated Ringers     REACTION: cardiac symptoms  .  Nortriptyline     Rapid heart rate  . Penicillins     REACTION: Rash  . Sulfamethoxazole W-Trimethoprim     REACTION: Levonne Spiller  . Sulfonamide Derivatives   . Trimethoprim     REACTION: stevens johnson syndrome    Family History  Problem Relation Age of Onset  . Leukemia Brother     CLL  . Arthritis Mother   . Arthritis Father   . Breast cancer Sister   . Arthritis Other   . Ovarian cancer Other   . Hyperlipidemia Other   . Hypertension Other   . Stroke Other     History   Social History  . Marital Status: Married    Spouse Name: N/A    Number of Children: N/A  . Years of Education: N/A   Occupational History  . Not on file.   Social History Main Topics  . Smoking status: Former Smoker    Types: Cigarettes  . Smokeless tobacco: Not on file     Comment: Quit over 30 years ago  . Alcohol Use: No  . Drug Use: No  . Sexually Active: Not on file   Other Topics Concern  . Not on file   Social History  Narrative  . No narrative on file     Constitutional: Denies fever, malaise, fatigue, headache or abrupt weight changes.  HEENT: Denies eye pain, eye redness, ear pain, ringing in the ears, wax buildup, runny nose, nasal congestion, bloody nose, or sore throat. Respiratory: Denies difficulty breathing, shortness of breath, cough or sputum production.   Musculoskeletal: Pt reports back pain. Denies decrease in range of motion, difficulty with gait, muscle pain or joint pain and swelling.  Skin: Denies redness, rashes, lesions or ulcercations.  Neurological: Pt reports numbness and tingling that shoots down into both legs. Denies dizziness, difficulty with memory, difficulty with speech or problems with balance and coordination.   No other specific complaints in a complete review of systems (except as listed in HPI above).     Objective:   Physical Exam  BP 138/90  Pulse 72  Temp 97.4 F (36.3 C) (Oral)  Ht 5' 3.5" (1.613 m)  Wt 153 lb (69.4 kg)  BMI  26.68 kg/m2  SpO2 96% Wt Readings from Last 3 Encounters:  02/17/12 153 lb (69.4 kg)  02/05/12 154 lb 1.9 oz (69.908 kg)  12/25/11 152 lb (68.947 kg)    General: Appears herstated age, well developed, well nourished in NAD. Cardiovascular: Normal rate and rhythm. S1,S2 noted.  No murmur, rubs or gallops noted. No JVD or BLE edema. No carotid bruits noted. Pulmonary/Chest: Normal effort and positive vesicular breath sounds. No respiratory distress. No wheezes, rales or ronchi noted.  Musculoskeletal: Normal range of motion. Positive straight leg raise bilaterally. No signs of joint swelling. No difficulty with gait.  Neurological: Alert and oriented. Cranial nerves II-XII intact. Coordination normal. +DTRs bilaterally.    BMET    Component Value Date/Time   NA 140 11/24/2011 1034   K 3.9 11/24/2011 1034   CL 108 11/24/2011 1034   CO2 24 11/24/2011 1034   GLUCOSE 100* 11/24/2011 1034   BUN 12 11/24/2011 1034   CREATININE 0.3* 11/24/2011 1034   CALCIUM 9.1 11/24/2011 1034   GFRNONAA 133.04 06/20/2008 1217   GFRAA  Value: >60        The eGFR has been calculated using the MDRD equation. This calculation has not been validated in all clinical situations. eGFR's persistently <60 mL/min signify possible Chronic Kidney Disease. 06/14/2008 1834    Lipid Panel     Component Value Date/Time   CHOL 239* 11/24/2011 1034   TRIG 380.0* 11/24/2011 1034   HDL 45.30 11/24/2011 1034   CHOLHDL 5 11/24/2011 1034   VLDL 76.0* 11/24/2011 1034   LDLCALC  Value: 155        Total Cholesterol/HDL:CHD Risk Coronary Heart Disease Risk Table                     Men   Women  1/2 Average Risk   3.4   3.3  Average Risk       5.0   4.4  2 X Average Risk   9.6   7.1  3 X Average Risk  23.4   11.0        Use the calculated Patient Ratio above and the CHD Risk Table to determine the patient's CHD Risk.        ATP III CLASSIFICATION (LDL):  <100     mg/dL   Optimal  355-732  mg/dL   Near or Above                    Optimal  130-159  mg/dL   Borderline  782-956  mg/dL   High  >213     mg/dL   Very High* 0/10/6576 4696    CBC    Component Value Date/Time   WBC 7.4 11/24/2011 1034   RBC 4.32 11/24/2011 1034   HGB 13.0 11/24/2011 1034   HCT 39.0 11/24/2011 1034   PLT 279.0 11/24/2011 1034   MCV 90.3 11/24/2011 1034   MCHC 33.4 11/24/2011 1034   RDW 13.2 11/24/2011 1034   LYMPHSABS 2.0 11/24/2011 1034   MONOABS 0.8 11/24/2011 1034   EOSABS 0.2 11/24/2011 1034   BASOSABS 0.0 11/24/2011 1034    Hgb A1C Lab Results  Component Value Date   HGBA1C  Value: 5.4 (NOTE)   The ADA recommends the following therapeutic goal for glycemic   control related to Hgb A1C measurement:   Goal of Therapy:   < 7.0% Hgb A1C   Reference: American Diabetes Association: Clinical Practice   Recommendations 2008, Diabetes Care,  2008, 31:(Suppl 1). 06/14/2008         Assessment & Plan:   Lumbago, with radicular symptoms noted bilaterally, new onset with additional workup required:  Continue taking tylenol as needed for pain Will obtain xrays of lumbar spine May need referral to orthopedics.  Take flexeril as prescribed by Dr. Felicity Coyer  RTC as needed or if symptoms persist.

## 2012-02-17 NOTE — Progress Notes (Signed)
Patient with gallstones on xray. Came in with fever and acute back pain. Will order ultram, gallbladder ultrasound and refer to GI. If pain not relieved, she needs to go to the ER.

## 2012-02-18 ENCOUNTER — Ambulatory Visit
Admission: RE | Admit: 2012-02-18 | Discharge: 2012-02-18 | Disposition: A | Discharge status: Home / Self Care | Payer: Medicare Other | Source: Ambulatory Visit | Attending: Internal Medicine | Admitting: Internal Medicine

## 2012-02-18 DIAGNOSIS — Z803 Family history of malignant neoplasm of breast: Secondary | ICD-10-CM

## 2012-02-18 DIAGNOSIS — Z1231 Encounter for screening mammogram for malignant neoplasm of breast: Secondary | ICD-10-CM

## 2012-02-19 ENCOUNTER — Telehealth: Payer: Self-pay | Admitting: *Deleted

## 2012-02-19 ENCOUNTER — Ambulatory Visit
Admission: RE | Admit: 2012-02-19 | Discharge: 2012-02-19 | Disposition: A | Discharge status: Home / Self Care | Payer: Medicare Other | Source: Ambulatory Visit | Attending: Internal Medicine | Admitting: Internal Medicine

## 2012-02-19 ENCOUNTER — Encounter: Payer: Self-pay | Admitting: Internal Medicine

## 2012-02-19 DIAGNOSIS — K8020 Calculus of gallbladder without cholecystitis without obstruction: Secondary | ICD-10-CM

## 2012-02-19 NOTE — Telephone Encounter (Signed)
Pt informed of results of U/S.    Ash,  Can you please call Mrs. Paolucci and let her know that she does have multiple small gallstones as well as a cyst in her right kidney. She also has evidence of extra fat around her liver. This is something she can followup with the GI doctor. Thx!  Rene Kocher

## 2012-04-04 ENCOUNTER — Telehealth: Payer: Self-pay | Admitting: Internal Medicine

## 2012-04-04 ENCOUNTER — Other Ambulatory Visit: Payer: Self-pay | Admitting: Internal Medicine

## 2012-04-04 NOTE — Telephone Encounter (Signed)
Patient left message stating she is out of town, in [place] caring for her mother, and did not take enough meds to get her through the next couple of days.  Please call [facility] [phone] to refill needed meds.

## 2012-04-05 NOTE — Telephone Encounter (Signed)
Called pt no answer LMOM to call back with name of medicine she is needing...Raechel Chute

## 2012-04-06 NOTE — Telephone Encounter (Signed)
Called pt again still no answer LMOM to RTC with name of medicine. Closing phone note...lmb

## 2012-06-13 ENCOUNTER — Other Ambulatory Visit: Payer: Self-pay | Admitting: *Deleted

## 2012-06-13 MED ORDER — CITALOPRAM HYDROBROMIDE 20 MG PO TABS: ORAL_TABLET | ORAL | Status: DC

## 2012-06-20 ENCOUNTER — Other Ambulatory Visit: Payer: Self-pay | Admitting: Internal Medicine

## 2012-07-08 ENCOUNTER — Encounter: Payer: Self-pay | Admitting: Internal Medicine

## 2012-07-08 ENCOUNTER — Ambulatory Visit (INDEPENDENT_AMBULATORY_CARE_PROVIDER_SITE_OTHER): Payer: Medicare Other | Admitting: Internal Medicine

## 2012-07-08 VITALS — BP 112/70 | HR 65 | Temp 98.3°F | Wt 153.0 lb

## 2012-07-08 DIAGNOSIS — M949 Disorder of cartilage, unspecified: Secondary | ICD-10-CM

## 2012-07-08 DIAGNOSIS — G43019 Migraine without aura, intractable, without status migrainosus: Secondary | ICD-10-CM

## 2012-07-08 DIAGNOSIS — M899 Disorder of bone, unspecified: Secondary | ICD-10-CM

## 2012-07-08 DIAGNOSIS — F329 Major depressive disorder, single episode, unspecified: Secondary | ICD-10-CM

## 2012-07-08 MED ORDER — BUPROPION HCL ER (XL) 150 MG PO TB24: 150.0000 mg | ORAL_TABLET | Freq: Every day | ORAL | Status: DC

## 2012-07-08 NOTE — Patient Instructions (Signed)
It was good to see you today. We have reviewed your prior records including labs and tests today we'll make referral to to for help with your migraine symptoms . Our office will contact you regarding appointment(s) once made. Start Wellbutrin once daily in addition to ongoing medications for depression -Your prescription(s) have been submitted to your pharmacy. Please take as directed and contact our office if you believe you are having problem(s) with the medication(s). Followup in 6-8 weeks to review medications and symptoms, call sooner if problems  Will also refer for followup bone density scan -office will contact you regarding this point

## 2012-07-08 NOTE — Progress Notes (Signed)
Subjective:    Patient ID: Lacey Salas, female    DOB: 16-Jan-1946, 67 y.o.   MRN: [id number]  HPI  Here for follow up -  reviewed chronic medical issues:  Depression hx - flare of symptoms ongoing since fall 2013 reviewed - on celexa for years, intol of low-dose venlafaxine trial 11/2011 (persisting mood swings and irritability, poor sleep); symptoms  exacerbated by family stressors (mother recently diagnosed with breast cancer) - denies SI/HI -  Complex migraines - hosp for same 2010 because of stroke like presentation: aphasia and right-sided facial paralysis. As previously followed at [facility] with neurology for same. Also local neurologist with Dr. love, last seen in 2011. Reports increasing frequency of attacks in past weeks - approximately once weekly, no change in symptom intensity or manifestation. Takes antidepressants and beta blocker for management of same, has previously declined other migraine medications or prophylaxis.   Dyslipidemia - previously prescribed low-dose simvastatin but never began same. Attempts to follow low fat diet but technologist weight gain trend.  Past Medical History  Diagnosis Date  . Anxiety   . Depression   . GERD (gastroesophageal reflux disease)   . Osteopenia   . Migraine     complicated migraines with transient R side facial paralysis and aphasia  . MVP (mitral valve prolapse)   . Stevens-Johnson disease   . Reflex sympathetic dystrophy, unspecified 2011    R hand related to wrist fx, improved s/p nerve blocks  . Hyperlipidemia   . History of chicken pox      Review of Systems  Constitutional: Negative for fever or unexpected weight change.  Respiratory: Negative for cough and shortness of breath.   Cardiovascular: Negative for chest pain or palpitations.      Objective:   Physical Exam  BP 112/70  Pulse 65  Temp(Src) 98.3 F (36.8 C) (Oral)  Wt 153 lb (69.4 kg)  BMI 26.67 kg/m2  SpO2 98% Wt Readings from Last 3 Encounters:   07/08/12 153 lb (69.4 kg)  02/17/12 153 lb (69.4 kg)  02/05/12 154 lb 1.9 oz (69.908 kg)   Constitutional: She appears well-developed and well-nourished. No distress.  myoclonic jerking "tic" of right side neck when describing stressful events  Neck: Normal range of motion. Neck supple. No JVD present. No thyromegaly present.  Cardiovascular: Normal rate, regular rhythm and normal heart sounds.  No murmur heard. No BLE edema. Pulmonary/Chest: Effort normal and breath sounds normal. No respiratory distress. She has no wheezes.  Neurological: Chronic decrease in left side nasolabial fold - R side facial deficits present at this time. She is alert and oriented to person, place, and time. Stuttering speech during tic -No other cranial nerve deficit. Coordination, balance and recall normal.  Skin: Skin is warm and dry. No rash noted. No erythema.  Psychiatric: She has a dysphoric mood and anxious affect. Her behavior is normal. Judgment and thought content normal.   Lab Results  Component Value Date   WBC 7.4 11/24/2011   HGB 13.0 11/24/2011   HCT 39.0 11/24/2011   PLT 279.0 11/24/2011   GLUCOSE 100* 11/24/2011   CHOL 239* 11/24/2011   TRIG 380.0* 11/24/2011   HDL 45.30 11/24/2011   LDLDIRECT 141.9 11/24/2011   LDLCALC  Value: 155        Total Cholesterol/HDL:CHD Risk Coronary Heart Disease Risk Table                     Men   Women  1/2 Average  Risk   3.4   3.3  Average Risk       5.0   4.4  2 X Average Risk   9.6   7.1  3 X Average Risk  23.4   11.0        Use the calculated Patient Ratio above and the CHD Risk Table to determine the patient's CHD Risk.        ATP III CLASSIFICATION (LDL):  <100     mg/dL   Optimal  782-956  mg/dL   Near or Above                    Optimal  130-159  mg/dL   Borderline  213-086  mg/dL   High  >578     mg/dL   Very High* 06/20/9627   ALT 17 11/24/2011   AST 21 11/24/2011   NA 140 11/24/2011   K 3.9 11/24/2011   CL 108 11/24/2011   CREATININE 0.3* 11/24/2011   BUN 12  11/24/2011   CO2 24 11/24/2011   TSH 1.73 11/24/2011   INR 1.0 06/14/2008   HGBA1C  Value: 5.4 (NOTE)   The ADA recommends the following therapeutic goal for glycemic   control related to Hgb A1C measurement:   Goal of Therapy:   < 7.0% Hgb A1C   Reference: American Diabetes Association: Clinical Practice   Recommendations 2008, Diabetes Care,  2008, 31:(Suppl 1). 06/14/2008        Assessment & Plan:   See problem list. Medications and labs reviewed today.

## 2012-07-08 NOTE — Assessment & Plan Note (Signed)
On celexa for years - increasing mood swings and migraine symptoms due to family stressors since 11/2011 Trial SNRI 11/2011 - effexor x 1 mo, then resumed celexa 20 -  Intol of nortriptyline qhs  12/2011: fast HR side effects  Add wellbutrin to SSRI now - we reviewed potential risk/benefit and possible side effects - pt understands and agrees to same  Verified no SI/HI Encouraged klonopin prn to help myoclonic head jerks and stress management follow up 6-8 weeks to review symptoms and medications, sooner if problems

## 2012-07-08 NOTE — Assessment & Plan Note (Signed)
Schedule followup DEXA

## 2012-07-08 NOTE — Assessment & Plan Note (Signed)
Long hx same - previously followed at [facility], then Dr Sandria Manly, but last seen 2011 Refer for new neuro now as increasing symptoms with increasing stress: Manifests with stroke-like symptoms: R facial droop and aphasia, overwhelming fatigue Traditionally, symptoms with increase stressors - last episode fall 2013 and spring 2014 reviewed - spouse leaving and upcoming court appearance, elderly parents in [place], mom with breast ca dx Last MRI/MR brain 06/2008 during hospitalization for same - no abnormality Intol nortriptyline trial 12/2011 Await neuro input - will continue beta-blocker and ?consider topamax if no relief with change in antidepressant - see above

## 2012-07-20 ENCOUNTER — Inpatient Hospital Stay: Admission: RE | Admit: 2012-07-20 | Payer: Medicare Other | Source: Ambulatory Visit

## 2012-07-27 ENCOUNTER — Ambulatory Visit (INDEPENDENT_AMBULATORY_CARE_PROVIDER_SITE_OTHER): Payer: Medicare Other | Admitting: Neurology

## 2012-07-27 ENCOUNTER — Encounter: Payer: Self-pay | Admitting: Neurology

## 2012-07-27 VITALS — BP 114/70 | HR 72 | Temp 98.0°F | Resp 12 | Ht 63.5 in | Wt 150.0 lb

## 2012-07-27 DIAGNOSIS — R569 Unspecified convulsions: Secondary | ICD-10-CM
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
## 2012-07-27 NOTE — Patient Instructions (Addendum)
Your EEG will be scheduled at [facility]. I will call and let you know the appointment date and time. You will need to arrive at first floor admitting fifteen minutes prior to your appointment time.  Enter the hospital at the new main entrance off of [address] at [address].    [phone].  Follow up with [place] on an as needed basis.

## 2012-08-04 ENCOUNTER — Ambulatory Visit (HOSPITAL_COMMUNITY): Payer: Medicare Other

## 2012-08-10 ENCOUNTER — Other Ambulatory Visit: Payer: Self-pay | Admitting: Family Medicine

## 2012-08-10 NOTE — Telephone Encounter (Signed)
[facility]...lmb 

## 2012-08-10 NOTE — Telephone Encounter (Signed)
Ok to refill 

## 2012-08-18 ENCOUNTER — Encounter: Payer: Self-pay | Admitting: Internal Medicine

## 2012-08-18 ENCOUNTER — Ambulatory Visit (INDEPENDENT_AMBULATORY_CARE_PROVIDER_SITE_OTHER): Payer: Medicare Other | Admitting: Internal Medicine

## 2012-08-18 ENCOUNTER — Other Ambulatory Visit: Payer: Medicare Other

## 2012-08-18 VITALS — BP 112/72 | HR 70 | Temp 98.3°F | Wt 145.1 lb

## 2012-08-18 DIAGNOSIS — F411 Generalized anxiety disorder: Secondary | ICD-10-CM

## 2012-08-18 DIAGNOSIS — F329 Major depressive disorder, single episode, unspecified: Secondary | ICD-10-CM

## 2012-08-18 DIAGNOSIS — G43019 Migraine without aura, intractable, without status migrainosus: Secondary | ICD-10-CM

## 2012-08-18 DIAGNOSIS — R259 Unspecified abnormal involuntary movements: Secondary | ICD-10-CM

## 2012-08-18 NOTE — Progress Notes (Signed)
Subjective:    Patient ID: Lacey Salas, female    DOB: June 25, 1945, 67 y.o.   MRN: [id number]  HPI  Here for follow up -  reviewed chronic medical issues and interval events/hx  Past Medical History  Diagnosis Date  . Anxiety   . Depression   . GERD (gastroesophageal reflux disease)   . Osteopenia   . Migraine     complicated migraines with transient R side facial paralysis and aphasia  . MVP (mitral valve prolapse)   . Stevens-Johnson disease   . Reflex sympathetic dystrophy, unspecified 2011    R hand related to wrist fx, improved s/p nerve blocks  . Hyperlipidemia   . History of chicken pox      Review of Systems  Constitutional: Negative for fever or unexpected weight change.  Respiratory: Negative for cough and shortness of breath.   Cardiovascular: Negative for chest pain or palpitations.      Objective:   Physical Exam  BP 112/72  Pulse 70  Temp(Src) 98.3 F (36.8 C) (Oral)  Wt 145 lb 1.9 oz (65.826 kg)  BMI 25.3 kg/m2  SpO2 96% Wt Readings from Last 3 Encounters:  08/18/12 145 lb 1.9 oz (65.826 kg)  07/27/12 150 lb (68.04 kg)  07/08/12 153 lb (69.4 kg)   Constitutional: She appears well-developed and well-nourished. No distress.  myoclonic jerking "tic" of right side neck when describing stressful events  Neck: Normal range of motion. Neck supple. No JVD present. No thyromegaly present.  Cardiovascular: Normal rate, regular rhythm and normal heart sounds.  No murmur heard. No BLE edema. Pulmonary/Chest: Effort normal and breath sounds normal. No respiratory distress. She has no wheezes.  Neurological: Chronic decrease in left side nasolabial fold - R side facial deficits present at this time. She is alert and oriented to person, place, and time. Stuttering speech during tic -No other cranial nerve deficit. Coordination, balance and recall normal.  Skin: Skin is warm and dry. No rash noted. No erythema.  Psychiatric: She has a mildly dysphoric mood and  anxious affect. Her behavior is normal. Judgment and thought content normal.   Lab Results  Component Value Date   WBC 7.4 11/24/2011   HGB 13.0 11/24/2011   HCT 39.0 11/24/2011   PLT 279.0 11/24/2011   GLUCOSE 100* 11/24/2011   CHOL 239* 11/24/2011   TRIG 380.0* 11/24/2011   HDL 45.30 11/24/2011   LDLDIRECT 141.9 11/24/2011   LDLCALC  Value: 155        Total Cholesterol/HDL:CHD Risk Coronary Heart Disease Risk Table                     Men   Women  1/2 Average Risk   3.4   3.3  Average Risk       5.0   4.4  2 X Average Risk   9.6   7.1  3 X Average Risk  23.4   11.0        Use the calculated Patient Ratio above and the CHD Risk Table to determine the patient's CHD Risk.        ATP III CLASSIFICATION (LDL):  <100     mg/dL   Optimal  829-562  mg/dL   Near or Above                    Optimal  130-159  mg/dL   Borderline  130-865  mg/dL   High  >784     mg/dL  Very High* 06/15/2008   ALT 17 11/24/2011   AST 21 11/24/2011   NA 140 11/24/2011   K 3.9 11/24/2011   CL 108 11/24/2011   CREATININE 0.3* 11/24/2011   BUN 12 11/24/2011   CO2 24 11/24/2011   TSH 1.73 11/24/2011   INR 1.0 06/14/2008   HGBA1C  Value: 5.4 (NOTE)   The ADA recommends the following therapeutic goal for glycemic   control related to Hgb A1C measurement:   Goal of Therapy:   < 7.0% Hgb A1C   Reference: American Diabetes Association: Clinical Practice   Recommendations 2008, Diabetes Care,  2008, 31:(Suppl 1). 06/14/2008        Assessment & Plan:   See problem list. Medications and labs reviewed today.

## 2012-08-18 NOTE — Patient Instructions (Signed)
It was good to see you today. We have reviewed your prior records including labs and tests today Medications reviewed and updated, no changes recommended at this time. Followup in 3-6 months to review medications and symptoms, call sooner if problems

## 2012-08-18 NOTE — Assessment & Plan Note (Signed)
Long hx same - previously followed at [facility], then Dr Sandria Manly, but last seen 2011 Refer for new neuro now as increasing symptoms with increasing stress: Manifests with stroke-like symptoms: R facial droop and aphasia, overwhelming fatigue Traditionally, symptoms with increase stressors - last episode fall 2013 and spring 2014 reviewed - spouse leaving and upcoming court appearance, elderly parents in [place], mom with breast ca dx Last MRI/MR brain 06/2008 during hospitalization for same - no abnormality Intol nortriptyline trial 12/2011 Reviewed 07/2012 neuro eval by Dr Smiley Houseman - ?seizures - he ordered EEG, but pt declined same Reviewed importance of test - will reorder now and refer for new neuro eval fall 2014 Note no increase in events or spells since beginning Wellbutrin, but if seizure diagnosis is confirmed, may need to consider alternate antidepressant will continue beta-blocker  Also consider topamax if no EEG changes

## 2012-08-18 NOTE — Assessment & Plan Note (Signed)
On celexa for years - increasing mood swings and migraine symptoms due to family stressors since 11/2011 Trial SNRI 11/2011 - effexor x 1 mo ineffective due to SE - so resumed celexa 20 -  Intol of nortriptyline qhs 12/2011: fast HR side effects  Added wellbutrin to SSRI 06/2012 - feels much improved: decrease myalgias but still poor sleep and worry driven by family stressors (legal cases with spouse) - Encouraged klonopin prn to help myoclonic head jerks and stress management Continue Wellbutrin with Celexa unless evidence for seizures is confirmed (see above regarding complex migraine evaluation)

## 2012-09-17 ENCOUNTER — Encounter (HOSPITAL_COMMUNITY): Payer: Self-pay | Admitting: Emergency Medicine

## 2012-09-17 ENCOUNTER — Emergency Department (HOSPITAL_COMMUNITY)
Admission: EM | Admit: 2012-09-17 | Discharge: 2012-09-18 | Disposition: A | Discharge status: Other Acute Inpatient Hospital | Payer: Medicare Other | Attending: Emergency Medicine | Admitting: Emergency Medicine

## 2012-09-17 DIAGNOSIS — F329 Major depressive disorder, single episode, unspecified: Secondary | ICD-10-CM | POA: Insufficient documentation

## 2012-09-17 DIAGNOSIS — Z8679 Personal history of other diseases of the circulatory system: Secondary | ICD-10-CM | POA: Insufficient documentation

## 2012-09-17 DIAGNOSIS — Z862 Personal history of diseases of the blood and blood-forming organs and certain disorders involving the immune mechanism: Secondary | ICD-10-CM | POA: Insufficient documentation

## 2012-09-17 DIAGNOSIS — F3289 Other specified depressive episodes: Secondary | ICD-10-CM | POA: Insufficient documentation

## 2012-09-17 DIAGNOSIS — Z8619 Personal history of other infectious and parasitic diseases: Secondary | ICD-10-CM | POA: Insufficient documentation

## 2012-09-17 DIAGNOSIS — R45851 Suicidal ideations: Secondary | ICD-10-CM | POA: Insufficient documentation

## 2012-09-17 DIAGNOSIS — R1310 Dysphagia, unspecified: Secondary | ICD-10-CM | POA: Insufficient documentation

## 2012-09-17 DIAGNOSIS — G43909 Migraine, unspecified, not intractable, without status migrainosus: Secondary | ICD-10-CM

## 2012-09-17 DIAGNOSIS — Z8659 Personal history of other mental and behavioral disorders: Secondary | ICD-10-CM

## 2012-09-17 DIAGNOSIS — R259 Unspecified abnormal involuntary movements: Secondary | ICD-10-CM | POA: Insufficient documentation

## 2012-09-17 DIAGNOSIS — M899 Disorder of bone, unspecified: Secondary | ICD-10-CM | POA: Insufficient documentation

## 2012-09-17 DIAGNOSIS — Z87891 Personal history of nicotine dependence: Secondary | ICD-10-CM | POA: Insufficient documentation

## 2012-09-17 DIAGNOSIS — F32A Depression, unspecified: Secondary | ICD-10-CM

## 2012-09-17 DIAGNOSIS — F411 Generalized anxiety disorder: Secondary | ICD-10-CM | POA: Insufficient documentation

## 2012-09-17 DIAGNOSIS — Z880 Allergy status to penicillin: Secondary | ICD-10-CM | POA: Insufficient documentation

## 2012-09-17 DIAGNOSIS — Z79899 Other long term (current) drug therapy: Secondary | ICD-10-CM | POA: Insufficient documentation

## 2012-09-17 DIAGNOSIS — Z872 Personal history of diseases of the skin and subcutaneous tissue: Secondary | ICD-10-CM | POA: Insufficient documentation

## 2012-09-17 DIAGNOSIS — Z8639 Personal history of other endocrine, nutritional and metabolic disease: Secondary | ICD-10-CM | POA: Insufficient documentation

## 2012-09-17 DIAGNOSIS — M949 Disorder of cartilage, unspecified: Secondary | ICD-10-CM | POA: Insufficient documentation

## 2012-09-17 DIAGNOSIS — K219 Gastro-esophageal reflux disease without esophagitis: Secondary | ICD-10-CM | POA: Insufficient documentation

## 2012-09-17 LAB — BASIC METABOLIC PANEL
BUN: 11 mg/dL (ref 6–23)
CO2: 24 mEq/L (ref 19–32)
Calcium: 9.6 mg/dL (ref 8.4–10.5)
Creatinine, Ser: 0.63 mg/dL (ref 0.50–1.10)

## 2012-09-17 LAB — CBC WITH DIFFERENTIAL/PLATELET
Basophils Absolute: 0 10*3/uL (ref 0.0–0.1)
Basophils Relative: 0 % (ref 0–1)
Eosinophils Relative: 1 % (ref 0–5)
HCT: 39.4 % (ref 36.0–46.0)
Lymphocytes Relative: 25 % (ref 12–46)
MCHC: 33.8 g/dL (ref 30.0–36.0)
MCV: 88.5 fL (ref 78.0–100.0)
Monocytes Absolute: 0.8 10*3/uL (ref 0.1–1.0)
RDW: 13.1 % (ref 11.5–15.5)

## 2012-09-17 LAB — RAPID URINE DRUG SCREEN, HOSP PERFORMED
Opiates: NOT DETECTED
Tetrahydrocannabinol: NOT DETECTED

## 2012-09-17 LAB — ETHANOL: Alcohol, Ethyl (B): <11 mg/dL (ref 0–11)

## 2012-09-17 MED ORDER — ATENOLOL 25 MG PO TABS
25.0000 mg | ORAL_TABLET | Freq: Every day | ORAL | Status: DC
Start: 2012-09-18 — End: 2012-09-18
  Administered 2012-09-18: 12.5 mg via ORAL
  Filled 2012-09-17: qty 1

## 2012-09-17 MED ORDER — CLONAZEPAM 0.5 MG PO TABS
0.5000 mg | ORAL_TABLET | Freq: Two times a day (BID) | ORAL | Status: DC | PRN
  Administered 2012-09-17 – 2012-09-18 (×2): 0.5 mg via ORAL
  Filled 2012-09-17 (×2): qty 1

## 2012-09-17 MED ORDER — BUPROPION HCL ER (XL) 150 MG PO TB24
150.0000 mg | ORAL_TABLET | Freq: Every day | ORAL | Status: DC
  Administered 2012-09-18: 150 mg via ORAL
  Filled 2012-09-17: qty 1

## 2012-09-17 MED ORDER — CITALOPRAM HYDROBROMIDE 20 MG PO TABS
20.0000 mg | ORAL_TABLET | Freq: Every day | ORAL | Status: DC
  Administered 2012-09-18: 20 mg via ORAL
  Filled 2012-09-17: qty 1

## 2012-09-17 MED ORDER — PANTOPRAZOLE SODIUM 40 MG PO TBEC
40.0000 mg | DELAYED_RELEASE_TABLET | Freq: Every day | ORAL | Status: DC
  Administered 2012-09-17 – 2012-09-18 (×2): 40 mg via ORAL
  Filled 2012-09-17 (×2): qty 1
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
## 2012-09-17 NOTE — ED Notes (Signed)
Security at bedside to wand pt and pt's belongings.  

## 2012-09-17 NOTE — ED Provider Notes (Signed)
History    CSN: [id number] Arrival date & time 09/17/12  1710    Chief Complaint  Patient presents with  . Medical Clearance    HPI HPI Comments: Lacey Salas is a 67 y.o. female who presents to the Emergency Department complaining of depressed thoughts and SI, but reports no plan and would not go through with it due to religious beliefs. Pt reports being under high stress due to having trouble with family and that the stress has been worsening since September.  Pt reports having a hx of complex migranes and take medications with no relief.  She is experiencing a migraine now and   Pt reports having hx of cognitive disorders chronic symptoms of aphasia and twitching and have seen multiple doctors, currently schedule do get an EEG with local Neurologist for further eval.. Pt denies having a recent MRI but has had previous MRIs. Pt denies fever, hallucinations, dilusions. nausea, vomiting, diarrhea diabetes, cholesterol,and  HPB. She is having severe depression and needs help.  Past Medical History  Diagnosis Date  . Anxiety   . Depression   . GERD (gastroesophageal reflux disease)   . Osteopenia   . Migraine     complicated migraines with transient R side facial paralysis and aphasia  . MVP (mitral valve prolapse)   . Stevens-Johnson disease   . Reflex sympathetic dystrophy, unspecified 2011    R hand related to wrist fx, improved s/p nerve blocks  . Hyperlipidemia   . History of chicken pox    Past Surgical History  Procedure Laterality Date  . Heel spur surgery  12/1996    right  . Abdominal hysterectomy      BSO  . Orif distal radius fracture  2004   Family History  Problem Relation Age of Onset  . Leukemia Brother     CLL  . Arthritis Mother   . Arthritis Father   . Breast cancer Sister   . Arthritis Other   . Ovarian cancer Other   . Hyperlipidemia Other   . Hypertension Other   . Stroke Other    History  Substance Use Topics  . Smoking status: Former Smoker     Types: Cigarettes  . Smokeless tobacco: Never Used     Comment: Quit over 30 years ago  . Alcohol Use: No   OB History   Grav Para Term Preterm Abortions TAB SAB Ect Mult Living                 Review of Systems  Psychiatric/Behavioral: Positive for suicidal ideas.    Allergies  Cephalexin; Epinephrine; Erythromycin; Lactated ringers; Nortriptyline; Penicillins; Sulfamethoxazole w-trimethoprim; Sulfonamide derivatives; and Trimethoprim  Home Medications   Current Outpatient Rx  Name  Route  Sig  Dispense  Refill  . acetaminophen (TYLENOL) 500 MG tablet   Oral   Take 500 mg by mouth every 6 (six) hours as needed for pain.         Marland Kitchen atenolol (TENORMIN) 25 MG tablet      TAKE 1 TABLET BY MOUTH EVERY DAY   90 tablet   2   . buPROPion (WELLBUTRIN XL) 150 MG 24 hr tablet   Oral   Take 1 tablet (150 mg total) by mouth daily.   30 tablet   5   . citalopram (CELEXA) 20 MG tablet      TAKE 1 TABLET (20 MG TOTAL) BY MOUTH DAILY.   90 tablet   0   . clonazePAM (  KLONOPIN) 0.5 MG tablet   Oral   Take 1 tablet (0.5 mg total) by mouth 2 (two) times daily as needed for anxiety.   20 tablet   1   . esomeprazole (NEXIUM) 20 MG capsule   Oral   Take 20 mg by mouth daily before breakfast. OVC          BP 116/90  Pulse 84  Temp(Src) 98.4 F (36.9 C) (Oral)  Resp 16  SpO2 97% Physical Exam  Nursing note and vitals reviewed. Constitutional: She appears well-developed and well-nourished. No distress.  HENT:  Head: Normocephalic and atraumatic.  Eyes: Pupils are equal, round, and reactive to light.  Neck: Normal range of motion. Neck supple.  Cardiovascular: Normal rate and regular rhythm.   Pulmonary/Chest: Effort normal.  Abdominal: Soft.  Neurological: She is alert.  Pt having twitches and intermittent aphagia which she describes is her baseline due to complex migraines  Skin: Skin is warm and dry.  Psychiatric: She is not actively hallucinating. She exhibits  a depressed mood. She expresses suicidal ideation. She expresses no homicidal ideation. She expresses no suicidal plans and no homicidal plans.    ED Course  Procedures (including critical care time) DIAGNOSTIC STUDIES: Oxygen Saturation is 97% on room air, normal by my interpretation.    COORDINATION OF CARE:   Labs Reviewed  CBC WITH DIFFERENTIAL  BASIC METABOLIC PANEL  URINE RAPID DRUG SCREEN (HOSP PERFORMED)  ETHANOL   No results found. 1. Migraines   2. H/O tics   3. Depression     MDM  Holding orders placed. Med Rec completed.  ACT consulted.  Dorthula Matas, PA-C 09/17/12 1839
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
## 2012-09-17 NOTE — ED Notes (Signed)
Medication effective; pt asleep. No s/s of distress.

## 2012-09-17 NOTE — ED Provider Notes (Signed)
Medical screening examination/treatment/procedure(s) were performed by non-physician practitioner and as supervising physician I was immediately available for consultation/collaboration.  Jackqulyn Mendel R. Idus Rathke, MD 09/17/12 2152 

## 2012-09-18 ENCOUNTER — Encounter (HOSPITAL_COMMUNITY): Payer: Self-pay | Admitting: *Deleted

## 2012-09-18 ENCOUNTER — Inpatient Hospital Stay (HOSPITAL_COMMUNITY)
Admission: AD | Admit: 2012-09-18 | Discharge: 2012-09-22 | DRG: 885 | Disposition: A | Discharge status: Home / Self Care | Payer: No Typology Code available for payment source | Source: Intra-hospital | Attending: Psychiatry | Admitting: Psychiatry

## 2012-09-18 DIAGNOSIS — M899 Disorder of bone, unspecified: Secondary | ICD-10-CM | POA: Diagnosis present

## 2012-09-18 DIAGNOSIS — E785 Hyperlipidemia, unspecified: Secondary | ICD-10-CM | POA: Diagnosis present

## 2012-09-18 DIAGNOSIS — Z872 Personal history of diseases of the skin and subcutaneous tissue: Secondary | ICD-10-CM

## 2012-09-18 DIAGNOSIS — F332 Major depressive disorder, recurrent severe without psychotic features: Principal | ICD-10-CM | POA: Diagnosis present

## 2012-09-18 DIAGNOSIS — G43019 Migraine without aura, intractable, without status migrainosus: Secondary | ICD-10-CM

## 2012-09-18 DIAGNOSIS — L511 Stevens-Johnson syndrome: Secondary | ICD-10-CM | POA: Diagnosis present

## 2012-09-18 DIAGNOSIS — G43109 Migraine with aura, not intractable, without status migrainosus: Secondary | ICD-10-CM | POA: Diagnosis present

## 2012-09-18 DIAGNOSIS — G4700 Insomnia, unspecified: Secondary | ICD-10-CM

## 2012-09-18 DIAGNOSIS — F411 Generalized anxiety disorder: Secondary | ICD-10-CM

## 2012-09-18 DIAGNOSIS — I059 Rheumatic mitral valve disease, unspecified: Secondary | ICD-10-CM | POA: Diagnosis present

## 2012-09-18 DIAGNOSIS — Z87891 Personal history of nicotine dependence: Secondary | ICD-10-CM

## 2012-09-18 DIAGNOSIS — G90519 Complex regional pain syndrome I of unspecified upper limb: Secondary | ICD-10-CM | POA: Diagnosis present

## 2012-09-18 DIAGNOSIS — R45851 Suicidal ideations: Secondary | ICD-10-CM

## 2012-09-18 DIAGNOSIS — F329 Major depressive disorder, single episode, unspecified: Secondary | ICD-10-CM

## 2012-09-18 DIAGNOSIS — K219 Gastro-esophageal reflux disease without esophagitis: Secondary | ICD-10-CM | POA: Diagnosis present

## 2012-09-18 DIAGNOSIS — G4710 Hypersomnia, unspecified: Secondary | ICD-10-CM

## 2012-09-18 DIAGNOSIS — R259 Unspecified abnormal involuntary movements: Secondary | ICD-10-CM | POA: Diagnosis not present

## 2012-09-18 DIAGNOSIS — Z79899 Other long term (current) drug therapy: Secondary | ICD-10-CM

## 2012-09-18 DIAGNOSIS — M949 Disorder of cartilage, unspecified: Secondary | ICD-10-CM

## 2012-09-18 MED ORDER — ACETAMINOPHEN 325 MG PO TABS
650.0000 mg | ORAL_TABLET | Freq: Once | ORAL | Status: AC
  Administered 2012-09-18: 650 mg via ORAL
  Filled 2012-09-18: qty 2

## 2012-09-18 MED ORDER — CLONAZEPAM 0.5 MG PO TABS
0.5000 mg | ORAL_TABLET | Freq: Two times a day (BID) | ORAL | Status: DC | PRN
  Administered 2012-09-20: 0.5 mg via ORAL
  Filled 2012-09-18: qty 1

## 2012-09-18 MED ORDER — ACETAMINOPHEN 325 MG PO TABS
650.0000 mg | ORAL_TABLET | Freq: Four times a day (QID) | ORAL | Status: DC | PRN
  Administered 2012-09-20: 650 mg via ORAL

## 2012-09-18 MED ORDER — TRAZODONE HCL 50 MG PO TABS: 50.0000 mg | ORAL_TABLET | Freq: Every evening | ORAL | Status: DC | PRN

## 2012-09-18 MED ORDER — PANTOPRAZOLE SODIUM 40 MG PO TBEC
40.0000 mg | DELAYED_RELEASE_TABLET | Freq: Every day | ORAL | Status: DC
  Administered 2012-09-19 – 2012-09-22 (×4): 40 mg via ORAL
  Filled 2012-09-18 (×6): qty 1

## 2012-09-18 MED ORDER — ALUM & MAG HYDROXIDE-SIMETH 200-200-20 MG/5ML PO SUSP: 30.0000 mL | ORAL | Status: DC | PRN

## 2012-09-18 MED ORDER — MAGNESIUM HYDROXIDE 400 MG/5ML PO SUSP: 30.0000 mL | Freq: Every day | ORAL | Status: DC | PRN

## 2012-09-18 MED ORDER — CITALOPRAM HYDROBROMIDE 20 MG PO TABS
20.0000 mg | ORAL_TABLET | Freq: Every day | ORAL | Status: DC
  Administered 2012-09-19 – 2012-09-22 (×4): 20 mg via ORAL
  Filled 2012-09-18 (×3): qty 1
  Filled 2012-09-18: qty 2
  Filled 2012-09-18 (×3): qty 1

## 2012-09-18 MED ORDER — BUPROPION HCL ER (XL) 150 MG PO TB24
150.0000 mg | ORAL_TABLET | Freq: Every day | ORAL | Status: DC
  Administered 2012-09-19 – 2012-09-22 (×4): 150 mg via ORAL
  Filled 2012-09-18 (×2): qty 1
  Filled 2012-09-18: qty 2
  Filled 2012-09-18 (×3): qty 1

## 2012-09-18 MED ORDER — ATENOLOL 25 MG PO TABS
25.0000 mg | ORAL_TABLET | Freq: Every day | ORAL | Status: DC
  Filled 2012-09-18 (×2): qty 1

## 2012-09-18 NOTE — ED Notes (Signed)
Pt continues to have suicide ideation with no plan. She denies any av/hi. Pt is ready for transport.

## 2012-09-18 NOTE — ED Notes (Signed)
rn paged the chaplin at [phone].

## 2012-09-18 NOTE — ED Provider Notes (Signed)
Pt alert ambulatory , gailt slightly unsteady walks unassisted. Pt mildly tremulous states gait and tremo typical after she gets complex migraine, which she exoperienced yesterday. Pt stable for transfer to [facility] Results for orders placed during the hospital encounter of 09/17/12  CBC WITH DIFFERENTIAL      Result Value Range   WBC 9.5  4.0 - 10.5 K/uL   RBC 4.45  3.87 - 5.11 MIL/uL   Hemoglobin 13.3  12.0 - 15.0 g/dL   HCT 16.1  09.6 - 04.5 %   MCV 88.5  78.0 - 100.0 fL   MCH 29.9  26.0 - 34.0 pg   MCHC 33.8  30.0 - 36.0 g/dL   RDW 40.9  81.1 - 91.4 %   Platelets 280  150 - 400 K/uL   Neutrophils Relative % 64  43 - 77 %   Neutro Abs 6.1  1.7 - 7.7 K/uL   Lymphocytes Relative 25  12 - 46 %   Lymphs Abs 2.4  0.7 - 4.0 K/uL   Monocytes Relative 9  3 - 12 %   Monocytes Absolute 0.8  0.1 - 1.0 K/uL   Eosinophils Relative 1  0 - 5 %   Eosinophils Absolute 0.1  0.0 - 0.7 K/uL   Basophils Relative 0  0 - 1 %   Basophils Absolute 0.0  0.0 - 0.1 K/uL  BASIC METABOLIC PANEL      Result Value Range   Sodium 138  135 - 145 mEq/L   Potassium 3.9  3.5 - 5.1 mEq/L   Chloride 105  96 - 112 mEq/L   CO2 24  19 - 32 mEq/L   Glucose, Bld 96  70 - 99 mg/dL   BUN 11  6 - 23 mg/dL   Creatinine, Ser 7.82  0.50 - 1.10 mg/dL   Calcium 9.6  8.4 - 95.6 mg/dL   GFR calc non Af Amer >90  >90 mL/min   GFR calc Af Amer >90  >90 mL/min  URINE RAPID DRUG SCREEN (HOSP PERFORMED)      Result Value Range   Opiates NONE DETECTED  NONE DETECTED   Cocaine NONE DETECTED  NONE DETECTED   Benzodiazepines NONE DETECTED  NONE DETECTED   Amphetamines NONE DETECTED  NONE DETECTED   Tetrahydrocannabinol NONE DETECTED  NONE DETECTED   Barbiturates NONE DETECTED  NONE DETECTED  ETHANOL      Result Value Range   Alcohol, Ethyl (B) <11  0 - 11 mg/dL   No results found.   Doug Sou, MD 09/18/12 1525
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
## 2012-09-18 NOTE — ED Notes (Signed)
rn called security for transport to the [facility].
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
## 2012-09-18 NOTE — Consult Note (Signed)
Reason for Consult: Mood d/o, suicidal thoughts Referring Physician:  [name] is an 68 y.o. female.  HPI: 67 year old caucasian female came in voluntarily seeking treatment for suicidal thoughts and mood disorder.  She has a hx of depression but reports increased depression due to her husband walking out of their 54 year old marriage.  She also reports her stressor includes her mother wanting to move out of assisted living facility and moving back to stay with her.  She is stressed out because she cannot take care of her self what more taking care of her mother.  Patient reports family discord between her and her brother and his wife not wanting to help her take care of her mother.  Patient voices depressed mood and exhibits flat affect.  She had fair eye contact during the interview.  She expressed hopelessness and helplessness.  She states she does not want to commit suicide due to her [place] catholic belief .  She reports poor sleep and poor appetite and low energy and was tearful during the interview.  She will be admitted to our inpatient unit for safety and stabilization and medication management.  Past Medical History  Diagnosis Date  . Anxiety   . Depression   . GERD (gastroesophageal reflux disease)   . Osteopenia   . Migraine     complicated migraines with transient R side facial paralysis and aphasia  . MVP (mitral valve prolapse)   . Stevens-Johnson disease   . Reflex sympathetic dystrophy, unspecified 2011    R hand related to wrist fx, improved s/p nerve blocks  . Hyperlipidemia   . History of chicken pox     Past Surgical History  Procedure Laterality Date  . Heel spur surgery  12/1996    right  . Abdominal hysterectomy      BSO  . Orif distal radius fracture  2004    Family History  Problem Relation Age of Onset  . Leukemia Brother     CLL  . Arthritis Mother   . Arthritis Father   . Breast cancer Sister   . Arthritis Other   . Ovarian  cancer Other   . Hyperlipidemia Other   . Hypertension Other   . Stroke Other     Social History:  reports that she has quit smoking. Her smoking use included Cigarettes. She smoked 0.00 packs per day. She has never used smokeless tobacco. She reports that she does not drink alcohol or use illicit drugs.  Allergies:  Allergies  Allergen Reactions  . Cephalexin     REACTION: Levonne Spiller  . Epinephrine     REACTION: hypertensive reaction  . Erythromycin     REACTION: Rash  . Lactated Ringers     REACTION: cardiac symptoms  . Nortriptyline     Rapid heart rate  . Penicillins     REACTION: Rash  . Sulfamethoxazole W-Trimethoprim     REACTION: Levonne Spiller  . Sulfonamide Derivatives   . Trimethoprim     REACTION: stevens johnson syndrome    Medications: I have reviewed the patient's current medications.  Results for orders placed during the hospital encounter of 09/17/12 (from the past 48 hour(s))  URINE RAPID DRUG SCREEN (HOSP PERFORMED)     Status: None   Collection Time    09/17/12  6:21 PM      Result Value Range   Opiates NONE DETECTED  NONE DETECTED   Cocaine NONE DETECTED  NONE DETECTED  Benzodiazepines NONE DETECTED  NONE DETECTED   Amphetamines NONE DETECTED  NONE DETECTED   Tetrahydrocannabinol NONE DETECTED  NONE DETECTED   Barbiturates NONE DETECTED  NONE DETECTED   Comment:            DRUG SCREEN FOR MEDICAL PURPOSES     ONLY.  IF CONFIRMATION IS NEEDED     FOR ANY PURPOSE, NOTIFY LAB     WITHIN 5 DAYS.                LOWEST DETECTABLE LIMITS     FOR URINE DRUG SCREEN     Drug Class       Cutoff (ng/mL)     Amphetamine      1000     Barbiturate      200     Benzodiazepine   200     Tricyclics       300     Opiates          300     Cocaine          300     THC              50  CBC WITH DIFFERENTIAL     Status: None   Collection Time    09/17/12  7:15 PM      Result Value Range   WBC 9.5  4.0 - 10.5 K/uL   RBC 4.45  3.87 - 5.11 MIL/uL    Hemoglobin 13.3  12.0 - 15.0 g/dL   HCT 04.5  40.9 - 81.1 %   MCV 88.5  78.0 - 100.0 fL   MCH 29.9  26.0 - 34.0 pg   MCHC 33.8  30.0 - 36.0 g/dL   RDW 91.4  78.2 - 95.6 %   Platelets 280  150 - 400 K/uL   Neutrophils Relative % 64  43 - 77 %   Neutro Abs 6.1  1.7 - 7.7 K/uL   Lymphocytes Relative 25  12 - 46 %   Lymphs Abs 2.4  0.7 - 4.0 K/uL   Monocytes Relative 9  3 - 12 %   Monocytes Absolute 0.8  0.1 - 1.0 K/uL   Eosinophils Relative 1  0 - 5 %   Eosinophils Absolute 0.1  0.0 - 0.7 K/uL   Basophils Relative 0  0 - 1 %   Basophils Absolute 0.0  0.0 - 0.1 K/uL  BASIC METABOLIC PANEL     Status: None   Collection Time    09/17/12  7:15 PM      Result Value Range   Sodium 138  135 - 145 mEq/L   Potassium 3.9  3.5 - 5.1 mEq/L   Chloride 105  96 - 112 mEq/L   CO2 24  19 - 32 mEq/L   Glucose, Bld 96  70 - 99 mg/dL   BUN 11  6 - 23 mg/dL   Creatinine, Ser 2.13  0.50 - 1.10 mg/dL   Calcium 9.6  8.4 - 08.6 mg/dL   GFR calc non Af Amer >90  >90 mL/min   GFR calc Af Amer >90  >90 mL/min   Comment:            The eGFR has been calculated     using the CKD EPI equation.     This calculation has not been     validated in all clinical     situations.     eGFR's persistently     <  90 mL/min signify     possible Chronic Kidney Disease.  ETHANOL     Status: None   Collection Time    09/17/12  7:15 PM      Result Value Range   Alcohol, Ethyl (B) <11  0 - 11 mg/dL   Comment:            LOWEST DETECTABLE LIMIT FOR     SERUM ALCOHOL IS 11 mg/dL     FOR MEDICAL PURPOSES ONLY    No results found.  Review of Systems  Constitutional: Negative.   HENT: Negative.   Eyes: Negative.   Respiratory: Negative.   Cardiovascular: Negative.   Gastrointestinal: Negative.   Genitourinary: Negative.   Musculoskeletal: Negative.   Skin: Negative.   Neurological: Negative.   Endo/Heme/Allergies: Negative.   Psychiatric/Behavioral: Positive for depression (Hx of deopression, rates her  depression 9/10) and suicidal ideas (Denies feeling suicidal but stated "I have given up on life, life has no meaning to me any more"). Negative for hallucinations, memory loss and substance abuse. The patient is nervous/anxious (Rates anxiety 8/10) and has insomnia (Reports hypersomnia, want to spend her whole day sleeping.).    Blood pressure 101/64, pulse 81, temperature 98.3 F (36.8 C), temperature source Oral, resp. rate 16, SpO2 96.00%. Physical Exam  Constitutional: She is oriented to person, place, and time. She appears well-developed and well-nourished. No distress.  HENT:  Head: Normocephalic and atraumatic.  Eyes: Conjunctivae and EOM are normal. Right eye exhibits no discharge. Left eye exhibits no discharge. No scleral icterus.  Neck: Normal range of motion. Neck supple. No JVD present. No tracheal deviation present. No thyromegaly present.  Cardiovascular: Normal rate, regular rhythm, normal heart sounds and intact distal pulses.   Respiratory: Effort normal. No respiratory distress. She has no wheezes. She has no rales. She exhibits no tenderness.  GI: Soft. Bowel sounds are normal. She exhibits no distension and no mass. There is no tenderness. There is no rebound and no guarding.  Musculoskeletal: Normal range of motion. She exhibits no edema and no tenderness.  Lymphadenopathy:    She has no cervical adenopathy.  Neurological: She is alert and oriented to person, place, and time. She has normal reflexes.  Skin: Skin is warm and dry. No rash noted. She is not diaphoretic. No erythema. No pallor.    Assessment/Plan:  Patient is admitted for safety and stabilization and medication management. Goal is to assist patient recover and be able to cope with her various stressors.   Dahlia Byes, C   PMHNP-BC 09/18/2012, 11:50 AM
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
## 2012-09-18 NOTE — ED Notes (Signed)
Chaplin jan rtned the call and stated she would visit with the pt at about 1330.

## 2012-09-18 NOTE — Tx Team (Signed)
Initial Interdisciplinary Treatment Plan  PATIENT STRENGTHS: (choose at least two) Ability for insight Active sense of humor Average or above average intelligence Capable of independent living Communication skills Financial means General fund of knowledge  PATIENT STRESSORS: Educational concerns Financial difficulties Health problems Legal issue Marital or family conflict Medication change or noncompliance   PROBLEM LIST: Problem List/Patient Goals Date to be addressed Date deferred Reason deferred Estimated date of resolution  Deperssion and ANxiety 09/18/12     Hx GERD, Osteopenia , Migraine,MVP, RSD 09/18/2012     Suicidal Ideations due to depression 09/18/2012                                          DISCHARGE CRITERIA:  Ability to meet basic life and health needs Adequate post-discharge living arrangements Improved stabilization in mood, thinking, and/or behavior Medical problems require only outpatient monitoring Motivation to continue treatment in a less acute level of care  PRELIMINARY DISCHARGE PLAN: Attend aftercare/continuing care group Outpatient therapy Participate in family therapy Return to previous living arrangement  PATIENT/FAMIILY INVOLVEMENT: This treatment plan has been presented to and reviewed with the patient, Lacey Salas, and/or family member,   The patient and family have been given the opportunity to ask questions and make suggestions.  Rich Brave 09/18/2012, 6:49 PM

## 2012-09-19 MED ORDER — ATENOLOL 12.5 MG HALF TABLET
12.5000 mg | ORAL_TABLET | Freq: Two times a day (BID) | ORAL | Status: DC
  Administered 2012-09-19 – 2012-09-22 (×7): 12.5 mg via ORAL
  Filled 2012-09-19 (×11): qty 1
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
## 2012-09-19 NOTE — BHH Suicide Risk Assessment (Signed)
[facility] Adult Inpatient Family/Significant Other Suicide Prevention Education  Suicide Prevention Education:   Patient Refusal for Family/Significant Other Suicide Prevention Education: The patient has refused to provide written consent for family/significant other to be provided Family/Significant Other Suicide Prevention Education during admission and/or prior to discharge.  Physician notified.  CSW provided suicide prevention information with patient.    The suicide prevention education provided includes the following:  Suicide risk factors  Suicide prevention and interventions  National Suicide Hotline telephone number  [facility] assessment telephone number  [facility] Emergency Assistance 911  [facility] and/or Residential Mobile Crisis Unit telephone number   Reyes Ivan, [place] 09/19/2012 9:45 AM

## 2012-09-19 NOTE — Progress Notes (Signed)
Adult Psychoeducational Group Note  Date:  09/19/2012 Time:  3:11 PM  Group Topic/Focus:  Wellness Toolbox:   The focus of this group is to discuss various aspects of wellness, balancing those aspects and exploring ways to increase the ability to experience wellness.  Patients will create a wellness toolbox for use upon discharge.  Participation Level:  Active  Participation Quality:  Appropriate, Attentive and Sharing  Affect:  Appropriate  Cognitive:  Alert and Appropriate  Insight: Appropriate  Engagement in Group:  Engaged  Modes of Intervention:  Discussion  Additional Comments:  Pt was appropriate and sharing while attending group. Pt shared that wellness to her was feeling better. Pt also shared that she plans to improve her wellness by excepting herself.   Sharyn Lull 09/19/2012, 3:11 PM

## 2012-09-19 NOTE — Progress Notes (Signed)
Grief and Loss Group  Group members processed their feelings and experiences related to grief and loss. Group members shared helpful coping strategies in dealing with their grief and loss.   Pt entered halfway through group. Pt was present and attentive during group. 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   Sherol Dade  Counselor Intern  Haroldine Laws
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
## 2012-09-19 NOTE — Progress Notes (Signed)
Recreation Therapy Notes  Date: 07.07.2014 Time: 3:00pm Location: [address] Dayroom      Group Topic/Focus: Leisure Education  Participation Level: Active  Participation Quality: Appropriate  Affect: Euthymic  Cognitive: Appropriate   Additional Comments: Activity: Leisure Alphabet ; Explanation: Patients were asked to select a letter of the alphabet from a container, then state a leisure/recreation activity that starts with that letter of the alphabet.  Patient defined wellness as: Mind, body & spirit working in concert. Group agreed to use that as group definition of wellness.   Patient actively participated in activity. Patient successfully stated an appropriate leisure/recreation activity to coorespond with the letter she selected. Patient encouraged and assisted peers as needed. Patient listened to discussion about the benefits of leisure/recreation. Patient successfully related group list to each aspect of group definition of wellness.   Marykay Lex Alazia Crocket, LRT/CTRS  Shanae Luo L 09/19/2012 4:06 PM

## 2012-09-19 NOTE — B[facility] Group Notes (Signed)
[facility] LCSW Aftercare Discharge Planning Group Note   09/19/2012  8:45 AM  Participation Quality:  Alert and Appropriate   Mood/Affect:  Appropriate, Anxious  Depression Rating: 3    Anxiety Rating:  7  Thoughts of Suicide:  Pt denies SI/HI  Will you contract for safety?   Yes  Current AVH:  Pt denies  Plan for Discharge/Comments:  Pt attended discharge planning group and actively participated in group.  CSW provided pt with today's workbook.  Pt states that she came to the hospital due to stress at home and caring for her family.  Pt states that she lives in [place] and will return home there.  Pt states that she sees her PCP at [facility] for medication management and a therapist at [facility].  CSW will confirm pt's upcoming appointments with both.  No further needs voiced by pt at this time.    Transportation Means: Pt reports having access to transportation  Supports: No supports mentioned today  Reyes Ivan, LCSWA 09/19/2012 12:12 PM

## 2012-09-19 NOTE — Progress Notes (Signed)
D:  Patient's self inventory sheet, patient sleeps fair, improving appetite, normal energy level, improving attention span.  Rated depression #7, hopelessness #5.  Denied withdrawals.  Denied SI.  Has experienced neck pain.  Pain goal fair, worst pain #3.  After discharge, plans to exercise and eat better.  No problems taking meds after discharge.   A:  Medications administered per MD orders.  Emotional support and encouragement given patient. R:  Denied SI and HI while talking to nurse, contracts for safety.  Denied A/V hallucinations.  Will continue to monitor patient for safety with 15 minute checks.  Safety maintained.
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
## 2012-09-19 NOTE — BHH Suicide Risk Assessment (Signed)
Suicide Risk Assessment  Admission Assessment     Nursing information obtained from:  Patient Demographic factors:  Age 67 or older;Caucasian;Unemployed Current Mental Status:    Loss Factors:  Decrease in vocational status;Loss of significant relationship Historical Factors:    Risk Reduction Factors:  Sense of responsibility to family;Positive social support;Positive therapeutic relationship  CLINICAL FACTORS:   Depression:   Insomnia, anhedonia  COGNITIVE FEATURES THAT CONTRIBUTE TO RISK: None identified     SUICIDE RISK:   Minimal: No identifiable suicidal ideation.  Patients presenting with no risk factors but with morbid ruminations; may be classified as minimal risk based on the severity of the depressive symptoms  PLAN OF CARE:  Supportive approach/coping skills                               Optimize treatment with Celexa/Wellbutrin                               CBT  I certify that inpatient services furnished can reasonably be expected to improve the patient's condition.  Aleatha Taite A 09/19/2012, 5:31 PM

## 2012-09-19 NOTE — Tx Team (Signed)
Interdisciplinary Treatment Plan Update (Adult)  Date: 09/19/2012  Time Reviewed:  9:45 AM  Progress in Treatment: Attending groups: Yes Participating in groups:  Yes Taking medication as prescribed:  Yes Tolerating medication:  Yes Family/Significant othe contact made: CSW assessing  Patient understands diagnosis:  Yes Discussing patient identified problems/goals with staff:  Yes Medical problems stabilized or resolved:  Yes Denies suicidal/homicidal ideation: Yes Issues/concerns per patient self-inventory:  Yes Other:  New problem(s) identified: N/A  Discharge Plan or Barriers: CSW assessing for appropriate referrals.  Reason for Continuation of Hospitalization: Anxiety Depression Medication Stabilization  Comments: N/A  Estimated length of stay: 3-5 days  For review of initial/current patient goals, please see plan of care.  Attendees: Patient:     Family:     Physician:   09/19/2012 9:46 AM   Nursing:   Cynthia Goble, RN 09/19/2012 9:46 AM   Clinical Social Worker:  Remingtyn Depaola Horton, LCSWA 09/19/2012 9:46 AM   Other: Laura Davis, NP 09/19/2012 9:46 AM   Other:  Maseta Dorley, MA care coordination 09/19/2012 9:46 AM   Other:  Beverly Knight, RN 09/19/2012 9:46 AM   Other:     Other:    Other:    Other:    Other:    Other:    Other:     Scribe for Treatment Team:   Horton, Bud Kaeser Nicole, 09/19/2012 9:46 AM   

## 2012-09-20 ENCOUNTER — Other Ambulatory Visit: Payer: Self-pay

## 2012-09-20 DIAGNOSIS — F332 Major depressive disorder, recurrent severe without psychotic features: Principal | ICD-10-CM

## 2012-09-20 NOTE — Progress Notes (Signed)
Patient was standing in line for medications this morning.  Patient began shaking, stated headache was starting.  Patient sat in chair, S. Ladona Ridgel and Press photographer and other staff with patient.  Librium and scheduled medications given to patient.  EKG completed per Dr. Runell Gess instructions.   Tylenol given for headache.  Wheelchair given to patient to ambulate because of her shaking, etc.  Patient has been in bed since EKG and stated she was feeling better.   Stated she has approximately one of these episodes weekly at home.  Patient safe and resting in bed at this time.  Will continue to monitor patient for safety with 15 minute checks.    D:  Patient's self inventory sheet, patient has fair sleep, improving appetite, normal energy level, improving attention span.  Rated depression #5, hopelessness #7.  Denied withdrawals.  Denied SI.  Plans to exercise more after discharge.  "A better pillow would help sleep."  No problems taking meds after discharge. A:  Medications administered per MD orders.  Emotional support and encouragement given patient. R:  Denied SI and HI.  Denied A/V hallucinations.   Denied pain.  Will continue to monitor patient with 15 minute checks for safety.   Safety maintained.

## 2012-09-20 NOTE — Progress Notes (Signed)
Psychoeducational Group Note  Date:  09/20/2012 Time:  1100  Group Topic/Focus:  Recovery Goals:   The focus of this group is to identify appropriate goals for recovery and establish a plan to achieve them.  Participation Level: Did Not Attend  Participation Quality:  Not Applicable  Affect:  Not Applicable  Cognitive:  Not Applicable  Insight:  Not Applicable  Engagement in Group: Not Applicable  Additional Comments:  Pt remained resting in bed during this group.  Sharyn Lull 09/20/2012, 1:23 PM
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
## 2012-09-20 NOTE — Progress Notes (Signed)
Recreation Therapy Notes  Date: 07.08.2014 Time: 2:45pm Location: [address] Dayroom      Group Topic/Focus: Animal Assist Activities/Therapy (AAA/T)  Participation Level: Active  Participation Quality: Appropriate  Affect: Euthymic  Cognitive: Appropriate  Additional Comments:  07.08.2014 Session = AAA Session; Dog Team: [place] and handler  Patient interacted appropriately with dog team, LRT and peers.   Lacey Salas L [name], LRT/CTRS  [name] L 09/20/2012 3:49 PM 

## 2012-09-20 NOTE — [facility] Group Notes (Signed)
Adult Psychoeducational Group Note  Date:  09/20/2012 Time:  10:41 PM  Group Topic/Focus:  Wrap-Up Group:   The focus of this group is to help patients review their daily goal of treatment and discuss progress on daily workbooks.  Participation Level:  Active  Participation Quality:  Attentive  Affect:  Appropriate  Cognitive:  Appropriate  Insight: Appropriate  Engagement in Group:  Engaged  Modes of Intervention:  Discussion  Additional Comments:  Lacey Salas stated she wanted to keep progressing, doing better and have a good night.  Lacey Salas A 09/20/2012, 10:41 PM

## 2012-09-20 NOTE — [facility] Group Notes (Signed)
[facility] LCSW Aftercare Discharge Planning Group Note   09/20/2012  8:45 AM  Participation Quality:  Did Not Attend  Reyes Ivan, LCSWA 09/20/2012 10:32 AM
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
## 2012-09-20 NOTE — [facility] Group Notes (Signed)
[facility] LCSW Group Therapy  09/20/2012  1:15 PM   Type of Therapy:  Group Therapy  Participation Level:  Active  Participation Quality:  Appropriate and Attentive  Affect:  Appropriate, Flat and Depressed  Cognitive:  Alert and Appropriate  Insight:  Developing/Improving and Engaged  Engagement in Therapy:  Developing/Improving and Engaged  Modes of Intervention:  Clarification, Confrontation, Discussion, Education, Exploration, Limit-setting, Orientation, Problem-solving, Rapport Building, Dance movement psychotherapist, Socialization and Support  Summary of Progress/Problems: The topic for group therapy was feelings about diagnosis.  Pt actively participated in group discussion on their past and current diagnosis and how they feel towards this.  Pt also identified how society and family members judge them, based on their diagnosis as well as stereotypes and stigmas.   Pt shared that her diagnosis is depression.  Pt states that she has anger and guilt towards having this diagnosis.  Pt states that she puts herself down for having depression.  Pt remains focused on her husband and learning to cope with this marriage, instead of focusing on herself.  Pt received positive feedback from peers.  Pt actively participated and was engaged in group discussion.    Patrena Santalucia Horton, LCSWA 09/20/2012 2:00 PM
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
## 2012-09-21 NOTE — Progress Notes (Signed)
Recreation Therapy Notes  Date: 07.09.2014 Time: 3:00pm Location: [address] Dayroom  Group Topic/Focus: Problem Solving, Communication, Team Work  Participation Level:  Active  Participation Quality:  Appropriate, Sharing and Supportive  Affect:  Euthymic  Cognitive:  Appropriate  Additional Comments: Activity: Launching Pad; Explanation: Patients were given the following supplies: 5 rubber bands, 10 paper clips, 1 paper cup, 3 cardboard tubes, 3 index cards and a length of masking tape. Using the supplies provided patients were asked build a contraption to launch a ping pong ball across the room. Patients worked in teams of 5.   Patient actively participated in group activity. Patient offered suggestions to team members for building contraption. Patient with peers were not successful at launching the ping pong ball across the room. Patient stated that this activity was still a success because her team worked well together. Patient stated her team communicated well with each other as well. Patient attributed team work and communication to her team being able to build the contraption. Patient related using these skills to her support system outside of the hospital.    Jearl Klinefelter, LRT/CTRS  Jearl Klinefelter 09/21/2012 4:05 PM

## 2012-09-21 NOTE — Progress Notes (Signed)
Patient ID: Lacey Salas, female   DOB: 1945/04/19, 67 y.o.   MRN: [id number] [facility] MD Progress Note  09/21/2012 3:28 PM JOHNIYA DURFEE  MRN:  [id number]  Subjective:  Katelynn presents today with some crying spells during group sessions. She reports that she is benefiting from the group sessions. She adds that she needs all the coping skills that she can learn and use because it will be hard trying to face life alone after 42 years of marriage. Jessenia says she is learning to put herself first, and not try to solve every problem within the family. She rated her depression at #7.  Diagnosis:   Axis I: Major Depression, Recurrent severe Axis II: Deferred Axis III:  Past Medical History  Diagnosis Date  . Anxiety   . Depression   . GERD (gastroesophageal reflux disease)   . Osteopenia   . Migraine     complicated migraines with transient R side facial paralysis and aphasia  . MVP (mitral valve prolapse)   . Stevens-Johnson disease   . Reflex sympathetic dystrophy, unspecified 2011    R hand related to wrist fx, improved s/p nerve blocks  . Hyperlipidemia   . History of chicken pox    Axis IV: other psychosocial or environmental problems, problems related to social environment and problems with primary support group Axis V: 51-60 moderate symptoms  ADL's:  Intact  Sleep: Poor  Appetite:  Fair  Suicidal Ideation:  Denies Homicidal Ideation:  Denies AEB (as evidenced by):  Psychiatric Specialty Exam: Review of Systems  Constitutional: Positive for malaise/fatigue.  HENT: Negative.   Eyes: Negative.   Respiratory: Negative.   Cardiovascular: Negative.   Gastrointestinal: Negative.   Genitourinary: Negative.   Musculoskeletal: Negative.   Skin: Negative.   Neurological: Negative.   Endo/Heme/Allergies: Negative.   Psychiatric/Behavioral: Positive for depression. Negative for suicidal ideas, hallucinations, memory loss and substance abuse. The patient is nervous/anxious and has  insomnia.     Blood pressure 132/76, pulse 66, temperature 98.3 F (36.8 C), temperature source Oral, resp. rate 16, height 5\' 4"  (1.626 m), weight 62.596 kg (138 lb), SpO2 94.00%.Body mass index is 23.68 kg/(m^2).  General Appearance: Neat and Well Groomed  Patent attorney::  Good  Speech:  Clear and Coherent  Volume:  Normal  Mood:  Anxious, Depressed and Hopeless  Affect:  Flat and Tearful  Thought Process:  Goal Directed and Intact  Orientation:  Full (Time, Place, and Person)  Thought Content:  Rumination  Suicidal Thoughts:  No  Homicidal Thoughts:  No  Memory:  Immediate;   Good Recent;   Good Remote;   Good  Judgement:  Fair  Insight:  Present  Psychomotor Activity:  Normal  Concentration:  Good  Recall:  Good  Akathisia:  No  Handed:  Right  AIMS (if indicated):     Assets:  Communication Skills Desire for Improvement Leisure Time Resilience Social Support Talents/Skills Vocational/Educational  Sleep:  Number of Hours: 6.75   Current Medications: Current Facility-Administered Medications  Medication Dose Route Frequency Provider Last Rate Last Dose  . acetaminophen (TYLENOL) tablet 650 mg  650 mg Oral Q6H PRN Earney Navy, NP   650 mg at 09/20/12 0846  . alum & mag hydroxide-simeth (MAALOX/MYLANTA) 200-200-20 MG/5ML suspension 30 mL  30 mL Oral Q4H PRN Earney Navy, NP      . atenolol (TENORMIN) tablet 12.5 mg  12.5 mg Oral BID AC & HS Fransisca Kaufmann, NP   12.5 mg at  09/21/12 0748  . buPROPion (WELLBUTRIN XL) 24 hr tablet 150 mg  150 mg Oral Daily Earney Navy, NP   150 mg at 09/21/12 0748  . citalopram (CELEXA) tablet 20 mg  20 mg Oral Daily Earney Navy, NP   20 mg at 09/21/12 0749  . clonazePAM (KLONOPIN) tablet 0.5 mg  0.5 mg Oral BID PRN Earney Navy, NP   0.5 mg at 09/20/12 0830  . magnesium hydroxide (MILK OF MAGNESIA) suspension 30 mL  30 mL Oral Daily PRN Earney Navy, NP      . pantoprazole (PROTONIX) EC tablet 40 mg  40 mg  Oral Daily Earney Navy, NP   40 mg at 09/21/12 0749  . traZODone (DESYREL) tablet 50 mg  50 mg Oral QHS PRN,MR X 1 Earney Navy, NP        Lab Results: No results found for this or any previous visit (from the past 48 hour(s)).  Physical Findings: AIMS: Facial and Oral Movements Muscles of Facial Expression: None, normal Lips and Perioral Area: None, normal Jaw: None, normal Tongue: None, normal,Extremity Movements Upper (arms, wrists, hands, fingers): None, normal Lower (legs, knees, ankles, toes): None, normal, Trunk Movements Neck, shoulders, hips: None, normal, Overall Severity Severity of abnormal movements (highest score from questions above): None, normal Incapacitation due to abnormal movements: None, normal Patient's awareness of abnormal movements (rate only patient's report): No Awareness, Dental Status Current problems with teeth and/or dentures?: No Does patient usually wear dentures?: No  CIWA:  CIWA-Ar Total: 1 COWS:  COWS Total Score: 1  Treatment Plan Summary: Daily contact with patient to assess and evaluate symptoms and progress in treatment Medication management  Plan: Continue crisis management and stabilization.  Medication management: Reviewed with patient who stated no untoward effects.  Encouraged patient to attend groups and participate in group counseling sessions and activities.  Discharge plan in progress.  Address health issues: Vitals reviewed and stable. Patient alert and oriented this afternoon in no acute distress. She will follow up with neurologist about possible seizure activity that has been a chronic problem.  Continue current treatment plan.   Medical Decision Making Problem Points:  Established problem, stable/improving (1) and Review of last therapy session (1) Data Points:  Review of medication regiment & side effects (2)  I certify that inpatient services furnished can reasonably be expected to improve the patient's  condition.   Armandina Stammer I NP-C 09/21/2012, 3:28 PM

## 2012-09-21 NOTE — [facility] Group Notes (Signed)
[facility] LCSW Aftercare Discharge Planning Group Note   09/21/2012 1:04 PM  Participation Quality:  Appropriate  Mood/Affect:  Appropriate and Depressed  Depression Rating:  3  Anxiety Rating:  8  Thoughts of Suicide:  No  Will you contract for safety?   NA  Current AVH:  No  Plan for Discharge/Comments:  Patient attending discharge planning group and actively participated in group.  CSW provided all participants with daily workbook.  She advised of need for medication management and willing for writer to schedule with [employer].   Transportation Means: Patient has transportation.   Supports:  Patient has limited support system.   Ting Cage, Joesph July

## 2012-09-21 NOTE — Progress Notes (Signed)
D-Patient is out in milieu interacting with peers and attending groups.  Rates depression at 6 and hopelessness at 8.Denies SI.  Reports improving appetite and normal energy.  A-  Support and encouragement given.  Review of medications done.  POC and 15' checks cont for safety.  R- Safety maintained.

## 2012-09-21 NOTE — [facility] Group Notes (Signed)
[facility] LCSW Group Therapy  Emotional Regulation 1:15 - 2: 30 PM        09/21/2012  1:06 PM   Type of Therapy:  Group Therapy  Participation Level:  Appropriate  Participation Quality:  Appropriate  Affect:  Appropriate  Cognitive:  Attentive Appropriate  Insight:  Developing/Improving Engaged  Engagement in Therapy:  Developing/Improving Engaged  Modes of Intervention:  Discussion Exploration Problem-Solving Supportive  Summary of Progress/Problems:  Group topic was emotional regulations.  Patient participated in the discussion and was able to identify anger an emotion that needed to regulated.  Patient shared her husband/family has the ability to make her feel invisible and empty.  Patient struggles to see that being anger means she is not empty and without emotions.  Patient shared she is here to get better but will have to learn not to feel guilty for taking care of herself.  Wynn Banker 09/21/2012 1:06 PM

## 2012-09-21 NOTE — Progress Notes (Signed)
Adult Psychoeducational Group Note  Date:  09/21/2012 Time:  11:08 PM  Group Topic/Focus:  Goals Group:   The focus of this group is to help patients establish daily goals to achieve during treatment and discuss how the patient can incorporate goal setting into their daily lives to aide in recovery.  Participation Level:  Active  Participation Quality:  Appropriate  Affect:  Appropriate  Cognitive:  Appropriate  Insight: Appropriate  Engagement in Group:  Engaged  Modes of Intervention:  Discussion  Additional Comments:  Pt stated that she is wanting to learn the skill of how to handle her aggression and anger.  Terie Purser R 09/21/2012, 11:08 PM

## 2012-09-22 DIAGNOSIS — F411 Generalized anxiety disorder: Secondary | ICD-10-CM

## 2012-09-22 DIAGNOSIS — F329 Major depressive disorder, single episode, unspecified: Secondary | ICD-10-CM

## 2012-09-22 MED ORDER — ATENOLOL 25 MG PO TABS: 25.0000 mg | ORAL_TABLET | Freq: Every day | ORAL | Status: DC

## 2012-09-22 MED ORDER — TRAZODONE HCL 50 MG PO TABS: 50.0000 mg | ORAL_TABLET | Freq: Every evening | ORAL | Status: DC | PRN

## 2012-09-22 MED ORDER — BUPROPION HCL ER (XL) 150 MG PO TB24: 150.0000 mg | ORAL_TABLET | Freq: Every day | ORAL | Status: DC

## 2012-09-22 MED ORDER — CITALOPRAM HYDROBROMIDE 20 MG PO TABS: 20.0000 mg | ORAL_TABLET | Freq: Every day | ORAL | Status: DC

## 2012-09-22 MED ORDER — CLONAZEPAM 0.5 MG PO TABS: 0.5000 mg | ORAL_TABLET | Freq: Two times a day (BID) | ORAL | Status: DC | PRN

## 2012-09-22 MED ORDER — ESOMEPRAZOLE MAGNESIUM 20 MG PO CPDR: 20.0000 mg | DELAYED_RELEASE_CAPSULE | Freq: Every day | ORAL | Status: DC

## 2012-09-22 NOTE — Progress Notes (Signed)
D: Patient resting in bed with eyes closed.  Respirations even and unlabored.  Patient appears to be in no apparent distress. A: Staff to monitor Q 15 mins for safety.   R:Patient remains safe on the unit.  

## 2012-09-22 NOTE — Plan of Care (Signed)
Problem: Ineffective individual coping Goal: LTG-Other (Specify)- Goal not met: CSW assessing for appropriate referrals for pt and will have follow up secured prior to d/c. Reyes Ivan, LCSWA 09/19/2012  12:59 PM  Outcome: Completed/Met Date Met:  09/22/12 Patient has attended groups and easily engaged in discussions.  Outpatient follow up is scheduled. Takenya Travaglini, LCSW 09/22/2012

## 2012-09-22 NOTE — B[facility] Group Notes (Signed)
[facility] LCSW Aftercare Discharge Planning Group Note   09/22/2012 12:23 PM  Participation Quality:  Appropriate  Mood/Affect: Appropriate  Depression Rating: 2  Anxiety Rating: 2  Thoughts of Suicide:  No  Will you contract for safety?   NA  Current AVH:  No  Plan for Discharge/Comments:  Patient attending discharge planning group and actively participated in group.  CSW provided all participants with daily workbook.  Patient reports being much better today and ready to discharge home.   Transportation Means: Patient has transportation.   Supports:  Patient has limited support system.   Lacey Salas, Joesph July

## 2012-09-22 NOTE — Discharge Summary (Signed)
Physician Discharge Summary Note  Patient:  Lacey Salas is an 67 y.o., female MRN:  [id number] DOB:  08-23-1945 Patient phone:  [phone] (home)  Patient address:   [address] [place] [address]   Date of Admission:  09/18/2012 Date of Discharge: 09/22/12  Discharge Diagnoses: Active Problems:   * No active hospital problems. *  Axis Diagnosis:  AXIS I: Major Depression, Anxiety Disorder NOS  AXIS II: Deferred  AXIS III:  Past Medical History   Diagnosis  Date   .  Anxiety    .  Depression    .  GERD (gastroesophageal reflux disease)    .  Osteopenia    .  Migraine      complicated migraines with transient R side facial paralysis and aphasia   .  MVP (mitral valve prolapse)    .  Stevens-Johnson disease    .  Reflex sympathetic dystrophy, unspecified  2011     R hand related to wrist fx, improved s/p nerve blocks   .  Hyperlipidemia    .  History of chicken pox     AXIS IV: other psychosocial or environmental problems, problems related to legal system/crime and problems with primary support group  AXIS V: 61-70 mild symptoms   Level of Care:  OP  Hospital Course:   Lacey Salas is an 67 y.o. female. Pt presents voluntarily to [facility] with passive SI. 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 Pt denies intent or plan. "I didn't want to live. I was at the end of my rope". Pt sees therapist at [facility]. She endorses insomnia, tearfulness, isolating, loss of interest, loss of appetite (12 lb weight loss). Pt denies HI. She denies [facility] and no delusions noted. Pt tearful. Current stressors include conflict with siblings, recent separation from husband, financial difficulties d/t assisting a relative. Pt reports walking into traffic one month ago while in [place] in a suicide attempt.   While a patient in this hospital, Lacey Salas was enrolled in group counseling and activities as well as received the following  medication Current facility-administered medications:acetaminophen (TYLENOL) tablet 650 mg, 650 mg, Oral, Q6H PRN, Earney Navy, NP, 650 mg at 09/20/12 0846;  alum & mag hydroxide-simeth (MAALOX/MYLANTA) 200-200-20 MG/5ML suspension 30 mL, 30 mL, Oral, Q4H PRN, Earney Navy, NP;  atenolol (TENORMIN) tablet 12.5 mg, 12.5 mg, Oral, BID AC & HS, Fransisca Kaufmann, NP, 12.5 mg at 09/22/12 0809 buPROPion (WELLBUTRIN XL) 24 hr tablet 150 mg, 150 mg, Oral, Daily, Earney Navy, NP, 150 mg at 09/22/12 0810;  citalopram (CELEXA) tablet 20 mg, 20 mg, Oral, Daily, Earney Navy, NP, 20 mg at 09/22/12 0810;  clonazePAM (KLONOPIN) tablet 0.5 mg, 0.5 mg, Oral, BID PRN, Earney Navy, NP, 0.5 mg at 09/20/12 0830;  magnesium hydroxide (MILK OF MAGNESIA) suspension 30 mL, 30 mL, Oral, Daily PRN, Earney Navy, NP pantoprazole (PROTONIX) EC tablet 40 mg, 40 mg, Oral, Daily, Earney Navy, NP, 40 mg at 09/22/12 0810;  traZODone (DESYREL) tablet 50 mg, 50 mg, Oral, QHS PRN,MR X 1, Earney Navy, NP The patient wanted to continue with her prior to admission medicines of Celexa 20 mg and Wellbutrin XL 150 mg since the Wellbutrin had only been started several weeks before. Trazodone 50 mg was ordered at hs prn to help improve the quality of his sleep. Her tenormin was continued to treat her migraine  headaches. Patient had one episode of tremors during her admission that was resolved with a dose of prn Klonopin. She reported having such episodes often and planned to follow up with a neurologist after d/c. Patient needed redirection during her stay to focus on her own needs admitting that "I am a fixer. My husband is passive aggressive and I he needs help." Patient reported that the groups were helpful in helping her feel so not alone. The patient reported that she would never consider suicide due to being such a strong Catholic. Patient attended treatment team meeting this am and met with treatment  team members. Pt symptoms, treatment plan and response to treatment discussed. Lacey Salas endorsed that their symptoms have improved. Pt also stated that they are stable for discharge.  In other to control Active Problems:   * No active hospital problems. * , they will continue psychiatric care on outpatient basis. They will follow-up at  Follow-up Information   Follow up with [facility] On 09/26/2012. (Appointment scheduled at 1:30 pm with Dr. Felicity Coyer for medication management)    Contact information:   [address] [place], [place] [address] Phone: [phone] Fax: [phone]      Follow up with [facility] On 09/26/2012. (Appointment scheduled at 4:00 pm Sherral Hammers for therapy)    Contact information:   [address], [address] Phone: [phone] Fax: [phone]      Follow up with [facility] On 09/30/2012. (Appointment scheduled at 10:00 am with Sherral Hammers for therapy)    Contact information:   [address], [address] Phone: [phone] Fax: [phone]    .  In addition they were instructed to take all your medications as prescribed by your mental healthcare provider, to report any adverse effects and or reactions from your medicines to your outpatient provider promptly, patient is instructed and cautioned to not engage in alcohol and or illegal drug use while on prescription medicines, in the event of worsening symptoms, patient is instructed to call the crisis hotline, 911 and or go to the nearest ED for appropriate evaluation and treatment of symptoms.   Upon discharge, patient adamantly denies suicidal, homicidal ideations, auditory, visual hallucinations and or delusional thinking. They left [facility] with all personal belongings in no apparent distress.  Consults:  See electronic record for details  Significant Diagnostic Studies:  See electronic record for details  Discharge Vitals:   Blood pressure 107/62, pulse 76,  temperature 98 F (36.7 C), temperature source Oral, resp. rate 18, height 5\' 4"  (1.626 m), weight 62.596 kg (138 lb), SpO2 94.00%..  Mental Status Exam: See Mental Status Examination and Suicide Risk Assessment completed by Attending Physician prior to discharge.  Discharge destination:  Home  Is patient on multiple antipsychotic therapies at discharge:  No  Has Patient had three or more failed trials of antipsychotic monotherapy by history: N/A Recommended Plan for Multiple Antipsychotic Therapies: N/A Discharge Orders   Future Appointments Provider Department Dept Phone   09/26/2012 1:30 PM Newt Lukes, MD [facility] [phone]   Future Orders Complete By Expires     Activity as tolerated - No restrictions  As directed         Medication List       Indication   acetaminophen 500 MG tablet  Commonly known as:  TYLENOL  Take 500 mg by mouth every 6 (six) hours as needed for pain.      atenolol 25  MG tablet  Commonly known as:  TENORMIN  Take 1 tablet (25 mg total) by mouth daily.   Indication:  Episodic Migraine Headache Prevention     buPROPion 150 MG 24 hr tablet  Commonly known as:  WELLBUTRIN XL  Take 1 tablet (150 mg total) by mouth daily. For depression.   Indication:  Major Depressive Disorder     citalopram 20 MG tablet  Commonly known as:  CELEXA  Take 1 tablet (20 mg total) by mouth daily. For depression.   Indication:  Depression     clonazePAM 0.5 MG tablet  Commonly known as:  KLONOPIN  Take 1 tablet (0.5 mg total) by mouth 2 (two) times daily as needed for anxiety.   Indication:  Panic Disorder     esomeprazole 20 MG capsule  Commonly known as:  NEXIUM  Take 1 capsule (20 mg total) by mouth daily before breakfast. OVC   Indication:  Gastroesophageal Reflux Disease with Current Symptoms     traZODone 50 MG tablet  Commonly known as:  DESYREL  Take 1 tablet (50 mg total) by mouth at bedtime as needed and Salas repeat  dose one time if needed for sleep.   Indication:  Trouble Sleeping, Major Depressive Disorder           Follow-up Information   Follow up with [facility] On 09/26/2012. (Appointment scheduled at 1:30 pm with Dr. Felicity Coyer for medication management)    Contact information:   [address] [place], [place] [address] Phone: [phone] Fax: [phone]      Follow up with [facility] On 09/26/2012. (Appointment scheduled at 4:00 pm Sherral Hammers for therapy)    Contact information:   [address], [address] Phone: [phone] Fax: [phone]      Follow up with [facility] On 09/30/2012. (Appointment scheduled at 10:00 am with Sherral Hammers for therapy)    Contact information:   [address], [address] Phone: [phone] Fax: [phone]     Follow-up recommendations:   Activities: Resume typical activities Diet: Resume typical diet Tests: none Other: Follow up with outpatient provider and report any side effects to out patient prescriber. Continue to work on the life style changes that could help to better manage your mood Comments:  Take all your medications as prescribed by your mental healthcare provider. Report any adverse effects and or reactions from your medicines to your outpatient provider promptly. Patient is instructed and cautioned to not engage in alcohol and or illegal drug use while on prescription medicines. In the event of worsening symptoms, patient is instructed to call the crisis hotline, 911 and or go to the nearest ED for appropriate evaluation and treatment of symptoms. Follow-up with your primary care provider for your other medical issues, concerns and or health care needs.  SignedFransisca Kaufmann NP-C 09/22/2012 10:29 AM Agree with assessment and plan Reymundo Poll. Dub Mikes, M.D.

## 2012-09-22 NOTE — [facility] Group Notes (Signed)
[facility] LCSW Group Therapy  09/22/2012  1:15 PM   Type of Therapy:  Group Therapy  Participation Level:  Active  Participation Quality:  Appropriate and Attentive  Affect:  Appropriate and Calm  Cognitive:  Alert and Appropriate  Insight:  Developing/Improving and Engaged  Engagement in Therapy:  Developing/Improving and Engaged  Modes of Intervention:  Activity, Clarification, Confrontation, Discussion, Education, Exploration, Limit-setting, Orientation, Problem-solving, Rapport Building, Reality Testing, Socialization and Support  Summary of Progress/Problems: Patient was attentive and engaged with speaker from [employer].  Patient expressed interest in their programs and services and received information on their agency.  Patient processed ways they can relate to the speaker.     Emmett Bracknell Horton, LCSWA 09/22/2012 1:37 PM    

## 2012-09-22 NOTE — Progress Notes (Signed)
D:  Patient's self inventory sheet, patient sleeps fair, improving appetite, normal energy level, improving attention span.   Rated depression #3, hopelessness #2.  Denied withdrawals.  Denied SI.  Denied physical problems.  "Trying to have more positive thoughts than negative, eating right and exercise.  Wonderful caring staff."  No discharge plans.  No problems staying on meds after discharge. A:  Medications administered per MD orders.  Emotional support and encouragement given patient. R:  Denied SI and HI.  Denied A/V hallucinations.  Denied pain.  Will continue to monitor patient every 15 minutes for safety.  Safety maintained.

## 2012-09-22 NOTE — [facility] Suicide Risk Assessment (Signed)
Suicide Risk Assessment  Discharge Assessment     Demographic Factors:  Age 67 or older, Caucasian and Living alone  Mental Status Per Nursing Assessment::   On Admission:     Current Mental Status by Physician: In full contact with reality. There are no suicidal ideas, plans or intent. She admits that being away from her environment has helped to see things more clearly. She has done some more grief work. She has accepted that there is not much more she can do in terms of the relationship with her husband.    Loss Factors: Loss of significant relationship, Decline in physical health and Legal issues  Historical Factors: NA  Risk Reduction Factors:   Sense of responsibility to family, Religious beliefs about death and Positive social support  Continued Clinical Symptoms:  Depression:   Insomnia  Cognitive Features That Contribute To Risk: No evidence   Suicide Risk:  Minimal: No identifiable suicidal ideation.  Patients presenting with no risk factors but with morbid ruminations; may be classified as minimal risk based on the severity of the depressive symptoms  Discharge Diagnoses:   AXIS I:  Major Depression, Anxiety Disorder NOS AXIS II:  Deferred AXIS III:   Past Medical History  Diagnosis Date  . Anxiety   . Depression   . GERD (gastroesophageal reflux disease)   . Osteopenia   . Migraine     complicated migraines with transient R side facial paralysis and aphasia  . MVP (mitral valve prolapse)   . Stevens-Johnson disease   . Reflex sympathetic dystrophy, unspecified 2011    R hand related to wrist fx, improved s/p nerve blocks  . Hyperlipidemia   . History of chicken pox    AXIS IV:  other psychosocial or environmental problems, problems related to legal system/crime and problems with primary support group AXIS V:  61-70 mild symptoms  Plan Of Care/Follow-up recommendations:  Activity:  as tolerated Diet:  regular Continue follow up outpatient basis Is  patient on multiple antipsychotic therapies at discharge:  No   Has Patient had three or more failed trials of antipsychotic monotherapy by history:  No  Recommended Plan for Multiple Antipsychotic Therapies: N/A   Lacey Salas A 09/22/2012, 12:34 PM

## 2012-09-22 NOTE — Plan of Care (Signed)
Problem: Alteration in thought process Goal: LTG-Patient behavior demonstrates decreased signs psychosis Goal not met: Pt to take medication as prescribed to decrease psychosis to baseline. Reyes Ivan, LCSWA 09/19/2012  12:58 PM   (Patient behavior demonstrates decreased signs of psychosis to the point the patient is safe to return home and continue treatment in an outpatient setting.)  Outcome: Completed/Met Date Met:  09/22/12 Goal met.  Patient is not endorsing psychosis and reports being at baseline.  Horace Porteous Ameli Sangiovanni, LCSW 09/22/2012

## 2012-09-22 NOTE — Progress Notes (Addendum)
Discharge Note:  Patient discharged home with family member.  Patient received all her belongings, clothing, toiletries, miscellaneous items, medications, prescriptions.   Suicide prevention information given and discussed with patient, who stated she understood and had no questions.  Patient stated she appreciated all assistance received from staff while at [facility].  Patient ha been cooperative and pleasant.  Patient denied SI and HI.  Denied A/V hallucinations.  Denied pain.

## 2012-09-22 NOTE — Plan of Care (Signed)
Problem: Alteration in mood; excessive anxiety as evidenced by: Goal: LTG-Patient's behavior demonstrates decreased anxiety Goal not met: Pt presents with anxious mood and affect. Pt admitted with anxiety rating of 10. Pt to show decreased sign of anxiety and a rating of 3 or less before d/c. Lacey Salas, LCSWA 09/19/2012 12:58 PM   (Patient's behavior demonstrates anxiety and he/she is utilizing learned coping skills to deal with anxiety-producing situations)  Outcome: Completed/Met Date Met:  09/22/12 Goal met.  Patient is rating anxiety at two.  Outpatient follow up is scheduled.  Horace Porteous Genella Bas, LCSW 09/22/2012

## 2012-09-22 NOTE — Progress Notes (Signed)
Adult Psychoeducational Group Note  Date:  09/22/2012 Time:  12:00 PM  Group Topic/Focus:  Rediscovering Joy:   The focus of this group is to explore various ways to relieve stress in a positive manner.  Participation Level:  Active  Participation Quality:  Intrusive, Resistant and Sharing  Affect:  Blunted, Flat and Irritable  Cognitive:  Appropriate  Insight: Lacking  Engagement in Group:  Defensive, Distracting, Poor and Resistant  Modes of Intervention:  Limit-setting  Additional Comments:  Pt attended the Rediscovery of Joy group.  She was limited in sharing 3 things that bring her joy.  She then proceeded to question 15 minute safety check, items she can bring into the hospital.  Underwriter explained safety priority for hospital for every patient.  She was resistant and defensive saying negative statements to peer before leaving group.  Aundria Rud, Jillienne Egner L 09/22/2012, 12:00 PM

## 2012-09-22 NOTE — Progress Notes (Signed)
[facility] Adult Case Management Discharge Plan :  Will you be returning to the same living situation after discharge: Yes,  Patient is returning to her home At discharge, do you have transportation home?:Yes,  Paitent is arranging transportatin home. Do you have the ability to pay for your medications: Yes, patient is able to obtain medications.   Release of information consent forms completed and in the chart;  Patient's signature needed at discharge.  Patient to Follow up at: Follow-up Information   Follow up with [facility] On 09/26/2012. (Appointment scheduled at 1:30 pm with Dr. Felicity Coyer for medication management)    Contact information:   [address] [place], [place] [address] Phone: [phone] Fax: [phone]      Follow up with [facility] On 09/26/2012. (Appointment scheduled at 4:00 pm Sherral Hammers for therapy)    Contact information:   [address], [address] Phone: [phone] Fax: [phone]      Follow up with [facility] On 09/30/2012. (Appointment scheduled at 10:00 am with Sherral Hammers for therapy)    Contact information:   [address], [address] Phone: [phone] Fax: [phone]      Follow up with [facility] [place].   Contact information:   [address] [place], [place]   [address]  [phone]      Patient denies SI/HI:  Patient no longer endorsing SI/HI or other thoughts of self harm.  Safety Planning and Suicide Prevention discussed: .Reviewed with all patients during discharge planning group    Wetzel Meester, Joesph July 09/22/2012, 1:59 PM

## 2012-09-23 ENCOUNTER — Telehealth: Payer: Self-pay

## 2012-09-23 ENCOUNTER — Other Ambulatory Visit: Payer: Self-pay | Admitting: Family Medicine

## 2012-09-23 NOTE — Telephone Encounter (Signed)
Clonazepam script has been faxed to [facility] [phone]

## 2012-09-26 ENCOUNTER — Encounter: Payer: Self-pay | Admitting: Internal Medicine

## 2012-09-26 ENCOUNTER — Ambulatory Visit (INDEPENDENT_AMBULATORY_CARE_PROVIDER_SITE_OTHER): Payer: Medicare Other | Admitting: Internal Medicine

## 2012-09-26 VITALS — BP 110/68 | HR 84 | Temp 98.0°F | Wt 143.0 lb

## 2012-09-26 DIAGNOSIS — F3289 Other specified depressive episodes: Secondary | ICD-10-CM

## 2012-09-26 DIAGNOSIS — F329 Major depressive disorder, single episode, unspecified: Secondary | ICD-10-CM

## 2012-09-26 DIAGNOSIS — F411 Generalized anxiety disorder: Secondary | ICD-10-CM

## 2012-09-26 MED ORDER — CLONAZEPAM 0.5 MG PO TABS: 0.5000 mg | ORAL_TABLET | Freq: Two times a day (BID) | ORAL | Status: DC | PRN

## 2012-09-26 MED ORDER — TRAZODONE HCL 50 MG PO TABS: 50.0000 mg | ORAL_TABLET | Freq: Every evening | ORAL | Status: DC | PRN

## 2012-09-26 NOTE — Assessment & Plan Note (Addendum)
On celexa for years - increasing mood swings and migraine symptoms due to family stressors since 11/2011 Trial SNRI 11/2011 - effexor x 1 mo ineffective due to SE - so resumed celexa 20 -  Intol of nortriptyline qhs 12/2011: fast HR side effects  Added wellbutrin to SSRI 06/2012 - initially felt much improved: decrease myalgias but still poor sleep and worry driven by family stressors (legal case ongoing with spouse) - Encouraged klonopin prn to help myoclonic head jerks and stress management, but infrequent use due to fear of "addiction potential" of BZs Continue Wellbutrin with Celexa unless evidence for seizures is confirmed (see above regarding complex migraine evaluation) IP stay 7/204 for SI reviewed - denies SI/HI at this time, stable today Ok for traz qhs prn - refill provided

## 2012-09-26 NOTE — Patient Instructions (Signed)
It was good to see you today. We have reviewed your prior records including labs and tests today Medications reviewed and updated, no changes recommended at this time. Ok for trazadone at bedtime as needed Refill on medication(s) as discussed today. Followup in 3-6 months to review medications and symptoms, call sooner if problems

## 2012-09-26 NOTE — Progress Notes (Signed)
Subjective:    Patient ID: Lacey Salas, female    DOB: 11/27/1945, 67 y.o.   MRN: [id number]  HPI  Here for follow up -  reviewed chronic medical issues and interval events/hx IP [facility] stay 09/2012 (last week) for SI  Past Medical History  Diagnosis Date  . Anxiety   . Depression   . GERD (gastroesophageal reflux disease)   . Osteopenia   . Migraine     complicated migraines with transient R side facial paralysis and aphasia  . MVP (mitral valve prolapse)   . Stevens-Johnson disease   . Reflex sympathetic dystrophy, unspecified 2011    R hand related to wrist fx, improved s/p nerve blocks  . Hyperlipidemia   . History of chicken pox      Review of Systems  Constitutional: Negative for fever or unexpected weight change.  Respiratory: Negative for cough and shortness of breath.   Cardiovascular: Negative for chest pain or palpitations.      Objective:   Physical Exam  BP 110/68  Pulse 84  Temp(Src) 98 F (36.7 C) (Oral)  Wt 143 lb (64.864 kg)  BMI 24.53 kg/m2  SpO2 95% Wt Readings from Last 3 Encounters:  09/26/12 143 lb (64.864 kg)  09/18/12 138 lb (62.596 kg)  08/18/12 145 lb 1.9 oz (65.826 kg)   Constitutional: She appears well-developed and well-nourished. No distress.  myoclonic jerking "tic" of right side neck when describing stressful events  Neck: Normal range of motion. Neck supple. No JVD present. No thyromegaly present.  Cardiovascular: Normal rate, regular rhythm and normal heart sounds.  No murmur heard. No BLE edema. Pulmonary/Chest: Effort normal and breath sounds normal. No respiratory distress. She has no wheezes.  Neurological: Chronic decrease in left side nasolabial fold - R side facial deficits present at this time. She is alert and oriented to person, place, and time. Stuttering speech during tic -No other cranial nerve deficit. Coordination, balance and recall normal.  Psychiatric: She has a mildly dysphoric mood and anxious affect. Her behavior  is normal. Judgment and thought content normal.   Lab Results  Component Value Date   WBC 9.5 09/17/2012   HGB 13.3 09/17/2012   HCT 39.4 09/17/2012   PLT 280 09/17/2012   GLUCOSE 96 09/17/2012   CHOL 239* 11/24/2011   TRIG 380.0* 11/24/2011   HDL 45.30 11/24/2011   LDLDIRECT 141.9 11/24/2011   LDLCALC  Value: 155        Total Cholesterol/HDL:CHD Risk Coronary Heart Disease Risk Table                     Men   Women  1/2 Average Risk   3.4   3.3  Average Risk       5.0   4.4  2 X Average Risk   9.6   7.1  3 X Average Risk  23.4   11.0        Use the calculated Patient Ratio above and the CHD Risk Table to determine the patient's CHD Risk.        ATP III CLASSIFICATION (LDL):  <100     mg/dL   Optimal  409-811  mg/dL   Near or Above                    Optimal  130-159  mg/dL   Borderline  914-782  mg/dL   High  >956     mg/dL   Very High* 04/16/3084  ALT 17 11/24/2011   AST 21 11/24/2011   NA 138 09/17/2012   K 3.9 09/17/2012   CL 105 09/17/2012   CREATININE 0.63 09/17/2012   BUN 11 09/17/2012   CO2 24 09/17/2012   TSH 1.73 11/24/2011   INR 1.0 06/14/2008   HGBA1C  Value: 5.4 (NOTE)   The ADA recommends the following therapeutic goal for glycemic   control related to Hgb A1C measurement:   Goal of Therapy:   < 7.0% Hgb A1C   Reference: American Diabetes Association: Clinical Practice   Recommendations 2008, Diabetes Care,  2008, 31:(Suppl 1). 06/14/2008        Assessment & Plan:   See problem list. Medications and labs reviewed today.  Time spent with patient today 25 minutes, greater than 50% time spent counseling patient on anxiety/ depression and medication review. Also review of prior records

## 2012-09-27 NOTE — Progress Notes (Signed)
Patient Discharge Instructions:  After Visit Summary (AVS):   Faxed to:  09/27/12 Discharge Summary Note:   Faxed to:  09/27/12 Psychiatric Admission Assessment Note:   Faxed to:  09/27/12 Suicide Risk Assessment - Discharge Assessment:   Faxed to:  09/27/12 Faxed/Sent to the Next Level Care provider:  09/27/12 Faxed to [facility] @ [phone] Faxed to [facility] @ [phone] Faxed to [facility] @ [phone]  Jerelene Redden, 09/27/2012, 1:32 PM

## 2012-10-20 ENCOUNTER — Other Ambulatory Visit: Payer: Self-pay | Admitting: *Deleted

## 2012-10-20 MED ORDER — TRAZODONE HCL 50 MG PO TABS: 50.0000 mg | ORAL_TABLET | Freq: Every evening | ORAL | Status: DC | PRN

## 2012-10-20 MED ORDER — BUPROPION HCL ER (XL) 150 MG PO TB24: 150.0000 mg | ORAL_TABLET | Freq: Every day | ORAL | Status: DC

## 2012-10-31 ENCOUNTER — Other Ambulatory Visit: Payer: Self-pay | Admitting: Internal Medicine

## 2012-10-31 ENCOUNTER — Telehealth: Payer: Self-pay | Admitting: *Deleted

## 2012-10-31 NOTE — Telephone Encounter (Signed)
Change Wellbutrin to one tab every other day for 2 weeks, then stop medication (slow wean is important) Call if depression worse during/after wean off med thanks

## 2012-10-31 NOTE — Telephone Encounter (Signed)
Left msg on vm stating the generic wellbrutrin md has her own is giving her the tremors, and she can't take. Shaking so bad can't write. Does not feel good. Pharmacist states she can't stop med. Requesting md advisement...lmb

## 2012-10-31 NOTE — Telephone Encounter (Signed)
Notified pt with md response.../lmb 

## 2012-11-02 ENCOUNTER — Ambulatory Visit (INDEPENDENT_AMBULATORY_CARE_PROVIDER_SITE_OTHER): Payer: Medicare Other | Admitting: Radiology

## 2012-11-02 DIAGNOSIS — R569 Unspecified convulsions: Secondary | ICD-10-CM

## 2012-11-02 NOTE — Procedures (Signed)
  History:  Lacey Salas is a 67 year old patient with a history of episodes of speech arrest and right-sided weakness. The patient may have jerking of the arm and body as well. The patient is being evaluated for possible seizures.  This is a routine EEG. No skull defects are noted. Medications include atenolol, Wellbutrin, Celexa, clonazepam, Nexium, and trazodone.  EEG classification: Normal awake and asleep  Description of the recording: The background rhythms of this recording consists of a fairly well modulated medium amplitude background activity of 9 Hz. As the record progresses, the patient initially is in the waking state, but appears to enter the early stage II sleep during the recording, with rudimentary sleep spindles and vertex sharp wave activity seen. During the wakeful state, photic stimulation is performed, and this results in a bilateral and symmetric photic driving response. Hyperventilation was also performed, and this results in a minimal buildup of the background rhythm activities without significant slowing seen. At no time during the recording does there appear to be evidence of spike or spike wave discharges or evidence of focal slowing. EKG monitor shows no evidence of cardiac rhythm abnormalities with a heart rate of 66.  Impression: This is a normal EEG recording in the waking and sleeping state. No evidence of ictal or interictal discharges were seen at any time during the recording.

## 2012-11-09 ENCOUNTER — Telehealth: Payer: Self-pay

## 2012-11-09 NOTE — Telephone Encounter (Signed)
The EEG was normal. No evidence of slowing or seizures noted

## 2012-11-09 NOTE — Telephone Encounter (Signed)
Notified pt with md response.../lmb 

## 2012-11-09 NOTE — Telephone Encounter (Signed)
Patient called LM on triage requesting EEG results done on 10/2012. Thanks

## 2012-12-06 ENCOUNTER — Other Ambulatory Visit (INDEPENDENT_AMBULATORY_CARE_PROVIDER_SITE_OTHER): Payer: Medicare Other

## 2012-12-06 ENCOUNTER — Encounter: Payer: Self-pay | Admitting: Internal Medicine

## 2012-12-06 ENCOUNTER — Ambulatory Visit (INDEPENDENT_AMBULATORY_CARE_PROVIDER_SITE_OTHER): Payer: Medicare Other | Admitting: Internal Medicine

## 2012-12-06 VITALS — BP 120/74 | HR 75 | Temp 98.1°F | Wt 141.1 lb

## 2012-12-06 DIAGNOSIS — Z23 Encounter for immunization: Secondary | ICD-10-CM

## 2012-12-06 DIAGNOSIS — E785 Hyperlipidemia, unspecified: Secondary | ICD-10-CM

## 2012-12-06 DIAGNOSIS — F329 Major depressive disorder, single episode, unspecified: Secondary | ICD-10-CM

## 2012-12-06 DIAGNOSIS — G43019 Migraine without aura, intractable, without status migrainosus: Secondary | ICD-10-CM

## 2012-12-06 LAB — LIPID PANEL
Cholesterol: 221 mg/dL — ABNORMAL HIGH (ref 0–200)
Triglycerides: 147 mg/dL (ref 0.0–149.0)

## 2012-12-06 LAB — LDL CHOLESTEROL, DIRECT: Direct LDL: 148.7 mg/dL

## 2012-12-06 MED ORDER — CITALOPRAM HYDROBROMIDE 10 MG PO TABS: 10.0000 mg | ORAL_TABLET | Freq: Every day | ORAL | Status: DC

## 2012-12-06 NOTE — Assessment & Plan Note (Signed)
Never began rx'd statin years ago - working on diet/exercise Recheck lipids now and reconsider tx as needed (reviewed significant weight loss in past 2 years approx 15# per pt)

## 2012-12-06 NOTE — Assessment & Plan Note (Signed)
Long hx same - previously followed at [facility], then Dr Sandria Manly, but last seen 2011 Refer for new neuro now as increasing symptoms with increasing stress: Manifests with stroke-like symptoms: R facial droop and aphasia, overwhelming fatigue Traditionally, symptoms with increase stressors - last episode fall 2013 and spring 2014 reviewed - spouse leaving and upcoming court appearance, elderly parents in [place], mom with breast ca dx Last MRI/MR brain 06/2008 during hospitalization for same - no abnormality Intol nortriptyline trial 12/2011 Reviewed 07/2012 neuro eval by Dr Smiley Houseman - ?seizures, but neg EEG 10/2012 will continue beta-blocker

## 2012-12-06 NOTE — Patient Instructions (Signed)
It was good to see you today. We have reviewed your prior records including labs and tests today Your annual flu shot was given and/or updated today. Medications reviewed and updated, add 10mg  citalopram to current 20mg  tab daily (total 30mg  daily) for treatment of depression -no other changes recommended today - Your prescription(s) have been submitted to your pharmacy. Please take as directed and contact our office if you believe you are having problem(s) with the medication(s). Test(s) ordered today. Your results will be released to MyChart (or called to you) after review, usually within 72hours after test completion. If any changes need to be made, you will be notified at that same time. Followup in 3-6 months to review medications and symptoms, call sooner if problems

## 2012-12-06 NOTE — Progress Notes (Signed)
Subjective:    Patient ID: Lacey Salas, female    DOB: 1945-04-28, 67 y.o.   MRN: [id number]  HPI Here for follow up -  reviewed chronic medical issues and interval events/hx IP [facility] stay 09/2012 for SI; now off wellbutrin due to increase tremor  Past Medical History  Diagnosis Date  . Anxiety   . Depression   . GERD (gastroesophageal reflux disease)   . Osteopenia   . Migraine     complicated migraines with transient R side facial paralysis and aphasia  . MVP (mitral valve prolapse)   . Stevens-Johnson disease   . Reflex sympathetic dystrophy, unspecified 2011    R hand related to wrist fx, improved s/p nerve blocks  . Hyperlipidemia   . History of chicken pox      Review of Systems Constitutional: Negative for fever or unexpected weight change.  Respiratory: Negative for cough and shortness of breath.   Cardiovascular: Negative for chest pain or palpitations.      Objective:   Physical Exam BP 120/74  Pulse 75  Temp(Src) 98.1 F (36.7 C) (Oral)  Wt 141 lb 1.9 oz (64.012 kg)  BMI 24.21 kg/m2  SpO2 95% Wt Readings from Last 3 Encounters:  12/06/12 141 lb 1.9 oz (64.012 kg)  09/26/12 143 lb (64.864 kg)  09/18/12 138 lb (62.596 kg)   Constitutional: She appears well-developed and well-nourished. No distress. chronic myoclonic jerking "tic" of right side neck when describing stressful events  Neck: Normal range of motion. Neck supple. No JVD present. No thyromegaly present.  Cardiovascular: Normal rate, regular rhythm and normal heart sounds.  No murmur heard. No BLE edema. Pulmonary/Chest: Effort normal and breath sounds normal. No respiratory distress. She has no wheezes.  Neurological: Chronic decrease in left side nasolabial fold - R side facial deficits not present at this time. She is alert and oriented to person, place, and time. Stuttering speech during tic -No other cranial nerve deficit. Coordination, balance and recall normal.  Psychiatric: She has a mildly  dysphoric mood and anxious affect. Her behavior is normal. Judgment and thought content normal.   Lab Results  Component Value Date   WBC 9.5 09/17/2012   HGB 13.3 09/17/2012   HCT 39.4 09/17/2012   PLT 280 09/17/2012   GLUCOSE 96 09/17/2012   CHOL 239* 11/24/2011   TRIG 380.0* 11/24/2011   HDL 45.30 11/24/2011   LDLDIRECT 141.9 11/24/2011   LDLCALC  Value: 155        Total Cholesterol/HDL:CHD Risk Coronary Heart Disease Risk Table                     Men   Women  1/2 Average Risk   3.4   3.3  Average Risk       5.0   4.4  2 X Average Risk   9.6   7.1  3 X Average Risk  23.4   11.0        Use the calculated Patient Ratio above and the CHD Risk Table to determine the patient's CHD Risk.        ATP III CLASSIFICATION (LDL):  <100     mg/dL   Optimal  409-811  mg/dL   Near or Above                    Optimal  130-159  mg/dL   Borderline  914-782  mg/dL   High  >956     mg/dL  Very High* 06/15/2008   ALT 17 11/24/2011   AST 21 11/24/2011   NA 138 09/17/2012   K 3.9 09/17/2012   CL 105 09/17/2012   CREATININE 0.63 09/17/2012   BUN 11 09/17/2012   CO2 24 09/17/2012   TSH 1.73 11/24/2011   INR 1.0 06/14/2008   HGBA1C  Value: 5.4 (NOTE)   The ADA recommends the following therapeutic goal for glycemic   control related to Hgb A1C measurement:   Goal of Therapy:   < 7.0% Hgb A1C   Reference: American Diabetes Association: Clinical Practice   Recommendations 2008, Diabetes Care,  2008, 31:(Suppl 1). 06/14/2008        Assessment & Plan:   See problem list. Medications and labs reviewed today.

## 2012-12-06 NOTE — Assessment & Plan Note (Signed)
On celexa for years - increasing mood swings and migraine symptoms due to family stressors since 11/2011 Trial SNRI 11/2011 - effexor x 1 mo ineffective due to SE, so resumed celexa 20 qd -  Intol of nortriptyline qhs 12/2011: fast HR side effects  Added wellbutrin to SSRI 06/2012 - initially felt much improved, but then stopped 10/2012 due to side effects (tremor) Will increase citalopra now to 30mg  qd to help poor sleep and worry driven by family stressors (legal case ongoing with spouse) - Encouraged klonopin prn to help myoclonic head jerks and stress management, but infrequent use due to fear of "addiction potential" of BZs no evidence for seizures on EEG 10/2012 (see below regarding complex migraine evaluation) IP stay 7/204 for SI reviewed - denies SI/HI at this time, stable today Denies need for traz qhs - removed from med list

## 2013-01-22 ENCOUNTER — Other Ambulatory Visit: Payer: Self-pay | Admitting: Internal Medicine

## 2013-02-28 ENCOUNTER — Ambulatory Visit (INDEPENDENT_AMBULATORY_CARE_PROVIDER_SITE_OTHER): Payer: Medicare Other | Admitting: Internal Medicine

## 2013-02-28 ENCOUNTER — Encounter: Payer: Self-pay | Admitting: Internal Medicine

## 2013-02-28 VITALS — BP 112/76 | HR 65 | Temp 97.5°F | Resp 18 | Wt 144.2 lb

## 2013-02-28 DIAGNOSIS — F329 Major depressive disorder, single episode, unspecified: Secondary | ICD-10-CM

## 2013-02-28 DIAGNOSIS — R05 Cough: Secondary | ICD-10-CM

## 2013-02-28 DIAGNOSIS — G43019 Migraine without aura, intractable, without status migrainosus: Secondary | ICD-10-CM

## 2013-02-28 DIAGNOSIS — R059 Cough, unspecified: Secondary | ICD-10-CM

## 2013-02-28 MED ORDER — LORATADINE 10 MG PO TABS: 10.0000 mg | ORAL_TABLET | Freq: Every day | ORAL | Status: DC

## 2013-02-28 MED ORDER — CITALOPRAM HYDROBROMIDE 40 MG PO TABS: 40.0000 mg | ORAL_TABLET | Freq: Every day | ORAL | Status: DC

## 2013-02-28 NOTE — Progress Notes (Signed)
Pre-visit discussion using our clinic review tool. No additional management support is needed unless otherwise documented below in the visit note.  

## 2013-02-28 NOTE — Assessment & Plan Note (Signed)
On celexa for years - increasing mood swings and migraine symptoms due to family stressors since 11/2011 Trial SNRI 11/2011 - effexor x 1 mo ineffective due to SE, so resumed celexa 20 qd -  Intol of nortriptyline qhs 12/2011: fast HR side effects  Added wellbutrin to SSRI 06/2012 - initially felt much improved, but then stopped 10/2012 due to side effects (tremor) Will increase citalopra now to 40mg  qd  Reviewed family stressors (legal case ongoing with spouse) - Encouraged klonopin prn to help myoclonic head jerks and stress management, but infrequent use due to fear of "addiction potential" of BZs no evidence for seizures on EEG 10/2012 (see below regarding complex migraine evaluation) IP stay 7/204 for SI reviewed - denies SI/HI at this time, stable today

## 2013-02-28 NOTE — Patient Instructions (Addendum)
It was good to see you today.  We have reviewed your prior records including labs and tests today  Medications reviewed and updated: increase citalopram to 40mg  daily Start claritin 10mg  daily for sinus allergy drainage to help cough Your prescription(s) have been submitted to your pharmacy. Please take as directed and contact our office if you believe you are having problem(s) with the medication(s).  we'll make referral for MRI brain . Our office will contact you regarding appointment(s) once made. We will then contact you with these results  Please schedule followup in 4 months, call sooner if problems.  Depression, Adult Depression is feeling sad, low, down in the dumps, blue, gloomy, or empty. In general, there are two kinds of depression:  Normal sadness or grief. This can happen after something upsetting. It often goes away on its own within 2 weeks. After losing a loved one (bereavement), normal sadness and grief may last longer than two weeks. It usually gets better with time.  Clinical depression. This kind lasts longer than normal sadness or grief. It keeps you from doing the things you normally do in life. It is often hard to function at home, work, or at school. It may affect your relationships with others. Treatment is often needed. GET HELP RIGHT AWAY IF:  You have thoughts about hurting yourself or others.  You lose touch with reality (psychotic symptoms). You may:  See or hear things that are not real.  Have untrue beliefs about your life or people around you.  Your medicine is giving you problems. MAKE SURE YOU:  Understand these instructions.  Will watch your condition.  Will get help right away if you are not doing well or get worse. Document Released: 04/04/2010 Document Revised: 11/25/2011 Document Reviewed: 07/02/2011 [facility] Patient Information 2014 [place], [place].

## 2013-02-28 NOTE — Assessment & Plan Note (Signed)
Long hx same - previously followed at [facility], then Dr Sandria Manly, but last seen 2011 Refer for new neuro now as increasing symptoms with increasing stress: Manifests with stroke-like symptoms: R facial droop and aphasia, overwhelming fatigue Traditionally, symptoms with increase stressors - last episode fall 2013 and spring 2014 reviewed - spouse leaving and upcoming court appearance, elderly parents in [place], mom with breast ca dx Last MRI/MR brain 06/2008 during hospitalization for same - no abnormality Because of increasing memory concerns, will recheck now Reviewed 07/2012 neuro eval by Dr Smiley Houseman - ?seizures, but neg EEG 10/2012 Advised to continue beta-blocker for prophlaxis

## 2013-02-28 NOTE — Progress Notes (Signed)
Subjective:    Patient ID: Lacey Salas, female    DOB: [birth year], 67 y.o.   MRN: [id number]  HPI Here for follow up -  reviewed chronic medical issues and interval events/hx   Past Medical History  Diagnosis Date  . Anxiety   . Depression   . GERD (gastroesophageal reflux disease)   . Osteopenia   . Migraine     complicated migraines with transient R side facial paralysis and aphasia  . MVP (mitral valve prolapse)   . Stevens-Johnson disease   . Reflex sympathetic dystrophy, unspecified 2011    R hand related to wrist fx, improved s/p nerve blocks  . Hyperlipidemia   . History of chicken pox      Review of Systems  Constitutional: Positive for fatigue. Negative for fever and unexpected weight change.  Respiratory: Positive for cough (dry, night).   Neurological: Positive for tremors (occ, improved since DCc of wellbutrin).  Psychiatric/Behavioral: Positive for dysphoric mood and decreased concentration. Negative for suicidal ideas, confusion, sleep disturbance and self-injury. The patient is nervous/anxious.          Objective:   Physical Exam BP 112/76  Pulse 65  Temp(Src) 97.5 F (36.4 C) (Oral)  Resp 18  Wt 144 lb 3.2 oz (65.409 kg)  SpO2 97% Wt Readings from Last 3 Encounters:  02/28/13 144 lb 3.2 oz (65.409 kg)  12/06/12 141 lb 1.9 oz (64.012 kg)  09/26/12 143 lb (64.864 kg)   Constitutional: She appears well-developed and well-nourished. No distress. chronic myoclonic jerking "tic" of right side neck when describing stressful events  Neck: Normal range of motion. Neck supple. No JVD present. No thyromegaly present.  Cardiovascular: Normal rate, regular rhythm and normal heart sounds.  No murmur heard. No BLE edema. Pulmonary/Chest: Effort normal and breath sounds normal. No respiratory distress. She has no wheezes.  Neurological: Chronic decrease in left side nasolabial fold - R side facial deficits not present at this time. She is alert and oriented to  person, place, and time. Stuttering speech during tic -No other cranial nerve deficit. Coordination, balance and recall normal.  Psychiatric: She has a mildly dysphoric mood and anxious affect. Her behavior is normal. Judgment and thought content normal.   Lab Results  Component Value Date   WBC 9.5 09/17/2012   HGB 13.3 09/17/2012   HCT 39.4 09/17/2012   PLT 280 09/17/2012   GLUCOSE 96 09/17/2012   CHOL 221* 12/06/2012   TRIG 147.0 12/06/2012   HDL 49.40 12/06/2012   LDLDIRECT 148.7 12/06/2012   LDLCALC  Value: 155        Total Cholesterol/HDL:CHD Risk Coronary Heart Disease Risk Table                     Men   Women  1/2 Average Risk   3.4   3.3  Average Risk       5.0   4.4  2 X Average Risk   9.6   7.1  3 X Average Risk  23.4   11.0        Use the calculated Patient Ratio above and the CHD Risk Table to determine the patient's CHD Risk.        ATP III CLASSIFICATION (LDL):  <100     mg/dL   Optimal  409-811  mg/dL   Near or Above                    Optimal  130-159  mg/dL   Borderline  161-096  mg/dL   High  >045     mg/dL   Very High* 4/0/9811   ALT 17 11/24/2011   AST 21 11/24/2011   NA 138 09/17/2012   K 3.9 09/17/2012   CL 105 09/17/2012   CREATININE 0.63 09/17/2012   BUN 11 09/17/2012   CO2 24 09/17/2012   TSH 1.73 11/24/2011   INR 1.0 06/14/2008   HGBA1C  Value: 5.4 (NOTE)   The ADA recommends the following therapeutic goal for glycemic   control related to Hgb A1C measurement:   Goal of Therapy:   < 7.0% Hgb A1C   Reference: American Diabetes Association: Clinical Practice   Recommendations 2008, Diabetes Care,  2008, 31:(Suppl 1). 06/14/2008        Assessment & Plan:   See problem list. Medications and labs reviewed today.  Dry cough - suspect nocturnal PND - add antihistamine with ongoing PPI x 30d - no evidence for infection on hx or exam - pt to call if worse or unimproved

## 2013-04-04 ENCOUNTER — Inpatient Hospital Stay: Admission: RE | Admit: 2013-04-04 | Payer: Self-pay | Source: Ambulatory Visit

## 2013-04-11 ENCOUNTER — Telehealth: Payer: Self-pay | Admitting: Internal Medicine

## 2013-04-11 MED ORDER — CITALOPRAM HYDROBROMIDE 40 MG PO TABS: 40.0000 mg | ORAL_TABLET | Freq: Every day | ORAL | Status: DC

## 2013-04-11 NOTE — Telephone Encounter (Signed)
Called pt back she states she think her pharmacy gave her the wrong citalopram. Wanting to make sure md increase to 40 mg at last visit. Look back @ last ov md did increase citalopram to 40 mg. Pt states her pharmacy gave her 10 mg. She is currently in [place] her father got sick and she is down there taking care of her parents. Wanting a script sent for 40 mg to [facility] in [place]...Raechel Chute

## 2013-04-11 NOTE — Telephone Encounter (Signed)
Patient is returning missed call. Please advise 

## 2013-04-11 NOTE — Telephone Encounter (Signed)
Called pt no answer LMOM RTC.../lmb 

## 2013-04-11 NOTE — Telephone Encounter (Signed)
Patient called and left VM on the triage line requesting a call back to discuss questions she has about her medications. States that she thinks she has "some things mixed up." Please call to advise.

## 2013-05-01 ENCOUNTER — Other Ambulatory Visit: Payer: Self-pay | Admitting: Internal Medicine

## 2013-05-05 ENCOUNTER — Other Ambulatory Visit: Payer: Self-pay | Admitting: Internal Medicine

## 2013-05-10 ENCOUNTER — Encounter (HOSPITAL_COMMUNITY): Payer: Self-pay | Admitting: Emergency Medicine

## 2013-05-10 ENCOUNTER — Emergency Department (HOSPITAL_COMMUNITY)
Admission: EM | Admit: 2013-05-10 | Discharge: 2013-05-10 | Disposition: A | Discharge status: Home / Self Care | Payer: Medicare Other | Attending: Emergency Medicine | Admitting: Emergency Medicine

## 2013-05-10 DIAGNOSIS — F32A Depression, unspecified: Secondary | ICD-10-CM

## 2013-05-10 DIAGNOSIS — F411 Generalized anxiety disorder: Secondary | ICD-10-CM | POA: Insufficient documentation

## 2013-05-10 DIAGNOSIS — Z8619 Personal history of other infectious and parasitic diseases: Secondary | ICD-10-CM | POA: Insufficient documentation

## 2013-05-10 DIAGNOSIS — Z8739 Personal history of other diseases of the musculoskeletal system and connective tissue: Secondary | ICD-10-CM | POA: Insufficient documentation

## 2013-05-10 DIAGNOSIS — F329 Major depressive disorder, single episode, unspecified: Secondary | ICD-10-CM | POA: Insufficient documentation

## 2013-05-10 DIAGNOSIS — G479 Sleep disorder, unspecified: Secondary | ICD-10-CM | POA: Insufficient documentation

## 2013-05-10 DIAGNOSIS — Z880 Allergy status to penicillin: Secondary | ICD-10-CM | POA: Insufficient documentation

## 2013-05-10 DIAGNOSIS — Z79899 Other long term (current) drug therapy: Secondary | ICD-10-CM | POA: Insufficient documentation

## 2013-05-10 DIAGNOSIS — R6883 Chills (without fever): Secondary | ICD-10-CM | POA: Insufficient documentation

## 2013-05-10 DIAGNOSIS — F39 Unspecified mood [affective] disorder: Secondary | ICD-10-CM | POA: Insufficient documentation

## 2013-05-10 DIAGNOSIS — F3289 Other specified depressive episodes: Secondary | ICD-10-CM | POA: Insufficient documentation

## 2013-05-10 DIAGNOSIS — G43909 Migraine, unspecified, not intractable, without status migrainosus: Secondary | ICD-10-CM | POA: Insufficient documentation

## 2013-05-10 DIAGNOSIS — Z862 Personal history of diseases of the blood and blood-forming organs and certain disorders involving the immune mechanism: Secondary | ICD-10-CM | POA: Insufficient documentation

## 2013-05-10 DIAGNOSIS — K219 Gastro-esophageal reflux disease without esophagitis: Secondary | ICD-10-CM | POA: Insufficient documentation

## 2013-05-10 DIAGNOSIS — F17200 Nicotine dependence, unspecified, uncomplicated: Secondary | ICD-10-CM | POA: Insufficient documentation

## 2013-05-10 DIAGNOSIS — Z872 Personal history of diseases of the skin and subcutaneous tissue: Secondary | ICD-10-CM | POA: Insufficient documentation

## 2013-05-10 DIAGNOSIS — Z8639 Personal history of other endocrine, nutritional and metabolic disease: Secondary | ICD-10-CM | POA: Insufficient documentation

## 2013-05-10 DIAGNOSIS — R259 Unspecified abnormal involuntary movements: Secondary | ICD-10-CM | POA: Insufficient documentation

## 2013-05-10 NOTE — Discharge Instructions (Signed)
Depression, Adult °Depression is feeling sad, low, down in the dumps, blue, gloomy, or empty. In general, there are two kinds of depression: °· Normal sadness or grief. This can happen after something upsetting. It often goes away on its own within 2 weeks. After losing a loved one (bereavement), normal sadness and grief may last longer than two weeks. It usually gets better with time. °· Clinical depression. This kind lasts longer than normal sadness or grief. It keeps you from doing the things you normally do in life. It is often hard to function at home, work, or at school. It may affect your relationships with others. Treatment is often needed. °GET HELP RIGHT AWAY IF: °· You have thoughts about hurting yourself or others. °· You lose touch with reality (psychotic symptoms). You may: °· See or hear things that are not real. °· Have untrue beliefs about your life or people around you. °· Your medicine is giving you problems. °MAKE SURE YOU: °· Understand these instructions. °· Will watch your condition. °· Will get help right away if you are not doing well or get worse. °Document Released: 04/04/2010 Document Revised: 11/25/2011 Document Reviewed: 07/02/2011 °[employer]® Patient Information ©2014 [employer]. ° °

## 2013-05-10 NOTE — ED Provider Notes (Signed)
CSN: [id number]     Arrival date & time 05/10/13  0542 History   First MD Initiated Contact with Patient 05/10/13 0545     Chief Complaint  Patient presents with  . Chills  . Shaking  . Depression     (Consider location/radiation/quality/duration/timing/severity/associated sxs/prior Treatment) HPI Comments: Patient is a 68 year old female with history of anxiety, depression, complex migraines who presents today after being brought in by EMS for shaking. She was outside for an hour and a half this am because she did not want her husband to leave. She reports that she was very cold and started shaking. She remembers the entire episode. She feels as though everything is "too much" and she is overwhelmed. She feels as though her depression has worsened since the death of her father earlier this month. She has other stressors including the death of her dog, her sister being diagnosed with cancer, and her husband wanting to leave her. She denies any SI or HI stating "I'm a devout Catholic and if I ever committed suicide I would go straight to hell". She reports "I look forward to leaving this world some time and going to heaven to join my father". She is able to contract for safety when she leaves. She does have any physical complaints and would not like to see a psychiatrist. She has a Veterinary surgeon in the community who knows her well. This counselors name is Public relations account executive.   The history is provided by the patient. No language interpreter was used.    Past Medical History  Diagnosis Date  . Anxiety   . Depression   . GERD (gastroesophageal reflux disease)   . Osteopenia   . Migraine     complicated migraines with transient R side facial paralysis and aphasia  . MVP (mitral valve prolapse)   . Stevens-Johnson disease   . Reflex sympathetic dystrophy, unspecified 2011    R hand related to wrist fx, improved s/p nerve blocks  . Hyperlipidemia   . History of chicken pox    Past Surgical History   Procedure Laterality Date  . Heel spur surgery  12/1996    right  . Abdominal hysterectomy      BSO  . Orif distal radius fracture  2004   Family History  Problem Relation Age of Onset  . Leukemia Brother     CLL  . Arthritis Mother   . Arthritis Father   . Breast cancer Sister   . Arthritis Other   . Ovarian cancer Other   . Hyperlipidemia Other   . Hypertension Other   . Stroke Other    History  Substance Use Topics  . Smoking status: Current Every Day Smoker    Types: Cigarettes  . Smokeless tobacco: Never Used     Comment: Quit over 30 years ago  . Alcohol Use: No   OB History   Grav Para Term Preterm Abortions TAB SAB Ect Mult Living                 Review of Systems  Constitutional: Positive for chills. Negative for fever.  Respiratory: Negative for shortness of breath.   Cardiovascular: Negative for chest pain.  Gastrointestinal: Negative for nausea, vomiting and abdominal pain.  Psychiatric/Behavioral: Positive for sleep disturbance and dysphoric mood. Negative for suicidal ideas. The patient is nervous/anxious.   All other systems reviewed and are negative.      Allergies  Cephalexin; Epinephrine; Erythromycin; Lactated ringers; Nortriptyline; Penicillins; Sulfamethoxazole-trimethoprim; Sulfonamide derivatives; Trimethoprim;  and Wellbutrin  Home Medications   Current Outpatient Rx  Name  Route  Sig  Dispense  Refill  . atenolol (TENORMIN) 25 MG tablet   Oral   Take 12.5 mg by mouth 2 (two) times daily.         . citalopram (CELEXA) 40 MG tablet   Oral   Take 1 tablet (40 mg total) by mouth daily.   90 tablet   2   . clonazePAM (KLONOPIN) 0.5 MG tablet   Oral   Take 0.5 mg by mouth 2 (two) times daily as needed for anxiety.         Marland Kitchen esomeprazole (NEXIUM) 20 MG capsule   Oral   Take 1 capsule (20 mg total) by mouth daily before breakfast. OVC          BP 151/64  Pulse 82  Temp(Src) 98.3 F (36.8 C) (Oral)  Resp 18  SpO2  94% Physical Exam  Nursing note and vitals reviewed. Constitutional: She is oriented to person, place, and time. She appears well-developed and well-nourished. She does not appear ill. No distress.  HENT:  Head: Normocephalic and atraumatic.  Right Ear: External ear normal.  Left Ear: External ear normal.  Nose: Nose normal.  Mouth/Throat: Oropharynx is clear and moist.  Eyes: Conjunctivae are normal.  Neck: Normal range of motion.  Cardiovascular: Normal rate, regular rhythm and normal heart sounds.   Pulmonary/Chest: Effort normal and breath sounds normal. No stridor. No respiratory distress. She has no wheezes. She has no rales.  Abdominal: Soft. She exhibits no distension.  Musculoskeletal: Normal range of motion.  Neurological: She is alert and oriented to person, place, and time. She has normal strength.  Skin: Skin is warm and dry. She is not diaphoretic. No erythema.  Psychiatric: Her behavior is normal. She exhibits a depressed mood. She expresses no homicidal and no suicidal ideation. She expresses no suicidal plans and no homicidal plans.  tearful    ED Course  Procedures (including critical care time) Labs Review Labs Reviewed - No data to display Imaging Review No results found.  EKG Interpretation   None       MDM   Final diagnoses:  Depression   Patient presents to the emergency department after an episode of shaking. She reports that she was outside for an hour and a half it was cold. She has a worsening depression without suicidal or homicidal ideations. The patient contracts for safety and will call her counselor upon departing the emergency room. She does not want evaluation of any physical complaints here. She is competent for decision making. Also discussed the plan with the husband who is agreeable. Encouraged the patient to return to the ED if she has any thoughts of harming herself or become unstable. Patient is agreeable to plan. Discussed case with  Dr. Hyacinth Meeker who agrees with plan. Vital signs stable for discharge. Patient / Family / Caregiver informed of clinical course, understand medical decision-making process, and agree with plan.   Mora Bellman, PA-C 05/10/13 [phone]

## 2013-05-10 NOTE — ED Notes (Signed)
EDPA at [facility]. Husband at [facility].

## 2013-05-10 NOTE — ED Notes (Signed)
Here by EMS after verbal altercation with husband. EMS called out by husband for sz. Pt has no h/o sz. Has a h/o complex migraines. EMS found pt shaking on floor of garage. Pt denies sz activity. Pt reported being cold and shivering. Admits to mild HA. Pt tearful with intermitant crying. Reports "lots of stress", "can't take it anymore". Lists: "sister has CA, mother in NH, father died, dog died, marriage problems, son in afganisitan". (denies: SI/HA, AVH, ETOH, SA, or physical sx other than "just cold and HA"; denies: blurred vision, light sensitivity, dizziness, nvd, fever or other sx). PCP Dr. Willey Blade. Has a therapist Airline pilot). Has not slept well in 3 wks, last ate 1800, last BM yesterday (normal), now mentions cough and congestion (clear).

## 2013-05-11 NOTE — ED Provider Notes (Signed)
Medical screening examination/treatment/procedure(s) were performed by non-physician practitioner and as supervising physician I was immediately available for consultation/collaboration.    Adonijah Baena D Toshi Ishii, MD 05/11/13 1725 

## 2013-07-04 ENCOUNTER — Ambulatory Visit (INDEPENDENT_AMBULATORY_CARE_PROVIDER_SITE_OTHER): Payer: Medicare Other | Admitting: Internal Medicine

## 2013-07-04 ENCOUNTER — Encounter: Payer: Self-pay | Admitting: Internal Medicine

## 2013-07-04 VITALS — BP 120/78 | HR 78 | Temp 98.0°F | Wt 139.1 lb

## 2013-07-04 DIAGNOSIS — F3289 Other specified depressive episodes: Secondary | ICD-10-CM

## 2013-07-04 DIAGNOSIS — R259 Unspecified abnormal involuntary movements: Secondary | ICD-10-CM

## 2013-07-04 DIAGNOSIS — F329 Major depressive disorder, single episode, unspecified: Secondary | ICD-10-CM

## 2013-07-04 DIAGNOSIS — G43019 Migraine without aura, intractable, without status migrainosus: Secondary | ICD-10-CM

## 2013-07-04 DIAGNOSIS — F17200 Nicotine dependence, unspecified, uncomplicated: Secondary | ICD-10-CM

## 2013-07-04 DIAGNOSIS — R251 Tremor, unspecified: Secondary | ICD-10-CM

## 2013-07-04 DIAGNOSIS — G43109 Migraine with aura, not intractable, without status migrainosus: Secondary | ICD-10-CM

## 2013-07-04 NOTE — Assessment & Plan Note (Signed)
On celexa for years - increasing mood swings and migraine symptoms due to family stressors since 11/2011 Trial SNRI 11/2011 - effexor x 1 mo ineffective due to SE, so resumed celexa 20 qd -  Intol of nortriptyline qhs 12/2011: fast HR side effects  Added wellbutrin to SSRI 06/2012 - initially felt much improved, but then stopped 10/2012 due to side effects (tremor) increased citalopram to max dose 02/2013 (40mg  qd)  Reviewed family stressors - Encouraged klonopin prn to help myoclonic head jerks and stress management, but infrequent use due to fear of "addiction potential" of BZs no evidence for seizures on EEG 10/2012 (see below regarding complex migraine evaluation) IP stay 7/204 for SI reviewed - denies SI/HI at this time, stable today

## 2013-07-04 NOTE — Assessment & Plan Note (Signed)
Long hx same - previously followed at [facility], then Dr Sandria Manly (last seen 2011) Refer for new neuro now as increasing symptoms with increasing stress: Manifests with stroke-like symptoms: R facial droop and aphasia, overwhelming fatigue Traditionally, symptoms with increase stressors - last episodes reviewed - marital discourse, upcoming court appearance, death of dad 05-02-13 and health issues with demented mom (breast ca, hip fx 05/02/13) Last MRI/MR brain 06/2008 during hospitalization for same - no abnormality Because of increasing memory concerns, will recheck now (not done 02/2013 due to stressors/no time) Reviewed 07/2012 neuro eval by Dr Smiley Houseman - ?seizures, but neg EEG 10/2012 Refer back for new neuro opinion - will request Tat given movement issues (tic, tremor, HA, memory) Advised to continue beta-blocker for prophlaxis

## 2013-07-04 NOTE — Progress Notes (Signed)
Subjective:    Patient ID: Lacey Salas, female    DOB: 1946/02/21, 68 y.o.   MRN: [id number]  HPI  Patient is here for follow up  Reviewed chronic medical issues and interval medical events  Past Medical History  Diagnosis Date  . Anxiety   . Depression   . GERD (gastroesophageal reflux disease)   . Osteopenia   . Migraine     complicated migraines with transient R side facial paralysis and aphasia  . MVP (mitral valve prolapse)   . Stevens-Johnson disease   . Reflex sympathetic dystrophy, unspecified 2011    R hand related to wrist fx, improved s/p nerve blocks  . Hyperlipidemia   . History of chicken pox     Review of Systems  Constitutional: Positive for fatigue. Negative for fever and unexpected weight change.  Respiratory: Negative for cough and shortness of breath.   Neurological: Positive for tremors, speech difficulty (stutter w/o change) and headaches (chronic w/o change intensity or freq). Negative for dizziness, syncope, weakness and numbness.  Psychiatric/Behavioral: Positive for dysphoric mood and decreased concentration. Negative for suicidal ideas, hallucinations, sleep disturbance and self-injury.       Objective:   Physical Exam  BP 120/78  Pulse 78  Temp(Src) 98 F (36.7 C) (Oral)  Wt 139 lb 1.9 oz (63.104 kg)  SpO2 95% Wt Readings from Last 3 Encounters:  07/04/13 139 lb 1.9 oz (63.104 kg)  02/28/13 144 lb 3.2 oz (65.409 kg)  12/06/12 141 lb 1.9 oz (64.012 kg)   Constitutional: She appears well-developed and well-nourished. No distress. Frequent head, neck, upper body and speech tics/spasm  Neck: Normal range of motion. Neck supple. No JVD present. No thyromegaly present.  Cardiovascular: Normal rate, regular rhythm and normal heart sounds.  No murmur heard. No BLE edema. Pulmonary/Chest: Effort normal and breath sounds normal. No respiratory distress. She has no wheezes.  Psychiatric: She has a dysphoric mood and affect. Her behavior is normal.  Judgment and thought content normal.   Lab Results  Component Value Date   WBC 9.5 09/17/2012   HGB 13.3 09/17/2012   HCT 39.4 09/17/2012   PLT 280 09/17/2012   GLUCOSE 96 09/17/2012   CHOL 221* 12/06/2012   TRIG 147.0 12/06/2012   HDL 49.40 12/06/2012   LDLDIRECT 148.7 12/06/2012   LDLCALC  Value: 155        Total Cholesterol/HDL:CHD Risk Coronary Heart Disease Risk Table                     Men   Women  1/2 Average Risk   3.4   3.3  Average Risk       5.0   4.4  2 X Average Risk   9.6   7.1  3 X Average Risk  23.4   11.0        Use the calculated Patient Ratio above and the CHD Risk Table to determine the patient's CHD Risk.        ATP III CLASSIFICATION (LDL):  <100     mg/dL   Optimal  924-268  mg/dL   Near or Above                    Optimal  130-159  mg/dL   Borderline  341-962  mg/dL   High  >229     mg/dL   Very High* 09/22/8919   ALT 17 11/24/2011   AST 21 11/24/2011   NA 138 09/17/2012  K 3.9 09/17/2012   CL 105 09/17/2012   CREATININE 0.63 09/17/2012   BUN 11 09/17/2012   CO2 24 09/17/2012   TSH 1.73 11/24/2011   INR 1.0 06/14/2008   HGBA1C  Value: 5.4 (NOTE)   The ADA recommends the following therapeutic goal for glycemic   control related to Hgb A1C measurement:   Goal of Therapy:   < 7.0% Hgb A1C   Reference: American Diabetes Association: Clinical Practice   Recommendations 2008, Diabetes Care,  2008, 31:(Suppl 1). 06/14/2008    No results found.     Assessment & Plan:   Problem List Items Addressed This Visit   DEPRESSION     On celexa for years - increasing mood swings and migraine symptoms due to family stressors since 11/2011 Trial SNRI 11/2011 - effexor x 1 mo ineffective due to SE, so resumed celexa 20 qd -  Intol of nortriptyline qhs 12/2011: fast HR side effects  Added wellbutrin to SSRI 06/2012 - initially felt much improved, but then stopped 10/2012 due to side effects (tremor) increased citalopram to max dose 02/2013 (40mg  qd)  Reviewed family stressors - Encouraged klonopin prn to help  myoclonic head jerks and stress management, but infrequent use due to fear of "addiction potential" of BZs no evidence for seizures on EEG 10/2012 (see below regarding complex migraine evaluation) IP stay 7/204 for SI reviewed - denies SI/HI at this time, stable today    Relevant Orders      Ambulatory referral to Neurology   MIGRAINE, COMMON, INTRACTABLE - Primary     Long hx same - previously followed at [facility], then Dr [facility] (last seen 2011) Refer for new neuro now as increasing symptoms with increasing stress: Manifests with stroke-like symptoms: R facial droop and aphasia, overwhelming fatigue Traditionally, symptoms with increase stressors - last episodes reviewed - marital discourse, upcoming court appearance, death of dad 05/10/2013 and health issues with demented mom (breast ca, hip fx May 10, 2013) Last MRI/MR brain 06/2008 during hospitalization for same - no abnormality Because of increasing memory concerns, will recheck now (not done 02/2013 due to stressors/no time) Reviewed 07/2012 neuro eval by Dr 08/2012 - ?seizures, but neg EEG 10/2012 Refer back for new neuro opinion - will request Tat given movement issues (tic, tremor, HA, memory) Advised to continue beta-blocker for prophlaxis     Other Visit Diagnoses   Tobacco use disorder        Complicated migraine        Relevant Orders       Ambulatory referral to Neurology    Tremor        Relevant Orders       Ambulatory referral to Neurology

## 2013-07-04 NOTE — Progress Notes (Signed)
Pre visit review using our clinic review tool, if applicable. No additional management support is needed unless otherwise documented below in the visit note. 

## 2013-07-04 NOTE — Patient Instructions (Addendum)
It was good to see you today.  We have reviewed your prior records including labs and tests today  Medications reviewed and updated, no changes recommended at this time.  we'll make referral to Dr Tat (or new neurologist). Our office will contact you regarding appointment(s) once made.  Please schedule followup in 6 months for annual (cholesterol and labs); call sooner if problems.  Continue to think about giving up cigarettes! Use nicotine gum, nicotine patches or electronic cigarettes. If you're interested in medication to help you quit, please call  - let me know how I can help!   Smoking Cessation, Tips for Success If you are ready to quit smoking, congratulations! You have chosen to help yourself be healthier. Cigarettes bring nicotine, tar, carbon monoxide, and other irritants into your body. Your lungs, heart, and blood vessels will be able to work better without these poisons. There are many different ways to quit smoking. Nicotine gum, nicotine patches, a nicotine inhaler, or nicotine nasal spray can help with physical craving. Hypnosis, support groups, and medicines help break the habit of smoking. WHAT THINGS CAN I DO TO MAKE QUITTING EASIER?  Here are some tips to help you quit for good:  Pick a date when you will quit smoking completely. Tell all of your friends and family about your plan to quit on that date.  Do not try to slowly cut down on the number of cigarettes you are smoking. Pick a quit date and quit smoking completely starting on that day.  Throw away all cigarettes.   Clean and remove all ashtrays from your home, work, and car.   On a card, write down your reasons for quitting. Carry the card with you and read it when you get the urge to smoke.   Cleanse your body of nicotine. Drink enough water and fluids to keep your urine clear or pale yellow. Do this after quitting to flush the nicotine from your body.   Learn to predict your moods. Do not let a bad  situation be your excuse to have a cigarette. Some situations in your life might tempt you into wanting a cigarette.   Never have "just one" cigarette. It leads to wanting another and another. Remind yourself of your decision to quit.   Change habits associated with smoking. If you smoked while driving or when feeling stressed, try other activities to replace smoking. Stand up when drinking your coffee. Brush your teeth after eating. Sit in a different chair when you read the paper. Avoid alcohol while trying to quit, and try to drink fewer caffeinated beverages. Alcohol and caffeine may urge you to smoke.   Avoid foods and drinks that can trigger a desire to smoke, such as sugary or spicy foods and alcohol.   Ask people who smoke not to smoke around you.   Have something planned to do right after eating or having a cup of coffee. For example, plan to take a walk or exercise.   Try a relaxation exercise to calm you down and decrease your stress. Remember, you may be tense and nervous for the first 2 weeks after you quit, but this will pass.   Find new activities to keep your hands busy. Play with a pen, coin, or rubber band. Doodle or draw things on paper.   Brush your teeth right after eating. This will help cut down on the craving for the taste of tobacco after meals. You can also try mouthwash.   Use oral substitutes in  place of cigarettes. Try using lemon drops, carrots, cinnamon sticks, or chewing gum. Keep them handy so they are available when you have the urge to smoke.   When you have the urge to smoke, try deep breathing.   Designate your home as a nonsmoking area.   If you are a heavy smoker, ask your health care provider about a prescription for nicotine chewing gum. It can ease your withdrawal from nicotine.   Reward yourself. Set aside the cigarette money you save and buy yourself something nice.   Look for support from others. Join a support group or smoking  cessation program. Ask someone at home or at work to help you with your plan to quit smoking.   Always ask yourself, "Do I need this cigarette or is this just a reflex?" Tell yourself, "Today, I choose not to smoke," or "I do not want to smoke." You are reminding yourself of your decision to quit.  Do not replace cigarette smoking with electronic cigarettes (commonly called e-cigarettes). The safety of e-cigarettes is unknown, and some may contain harmful chemicals.  If you relapse, do not give up! Plan ahead and think about what you will do the next time you get the urge to smoke.  HOW WILL I FEEL WHEN I QUIT SMOKING? You may have symptoms of withdrawal because your body is used to nicotine (the addictive substance in cigarettes). You may crave cigarettes, be irritable, feel very hungry, cough often, get headaches, or have difficulty concentrating. The withdrawal symptoms are only temporary. They are strongest when you first quit but will go away within 10 14 days. When withdrawal symptoms occur, stay in control. Think about your reasons for quitting. Remind yourself that these are signs that your body is healing and getting used to being without cigarettes. Remember that withdrawal symptoms are easier to treat than the major diseases that smoking can cause.  Even after the withdrawal is over, expect periodic urges to smoke. However, these cravings are generally short lived and will go away whether you smoke or not. Do not smoke!  WHAT RESOURCES ARE AVAILABLE TO HELP ME QUIT SMOKING? Your health care provider can direct you to community resources or hospitals for support, which may include:  Group support.  Education.  Hypnosis.  Therapy. Document Released: 11/29/2003 Document Revised: 12/21/2012 Document Reviewed: 08/18/2012 [facility] Patient Information 2014 [place], [place]. Smoking Cessation, Tips for Success If you are ready to quit smoking, congratulations! You have chosen to help  yourself be healthier. Cigarettes bring nicotine, tar, carbon monoxide, and other irritants into your body. Your lungs, heart, and blood vessels will be able to work better without these poisons. There are many different ways to quit smoking. Nicotine gum, nicotine patches, a nicotine inhaler, or nicotine nasal spray can help with physical craving. Hypnosis, support groups, and medicines help break the habit of smoking. WHAT THINGS CAN I DO TO MAKE QUITTING EASIER?  Here are some tips to help you quit for good:  Pick a date when you will quit smoking completely. Tell all of your friends and family about your plan to quit on that date.  Do not try to slowly cut down on the number of cigarettes you are smoking. Pick a quit date and quit smoking completely starting on that day.  Throw away all cigarettes.   Clean and remove all ashtrays from your home, work, and car.   On a card, write down your reasons for quitting. Carry the card with you and read  it when you get the urge to smoke.   Cleanse your body of nicotine. Drink enough water and fluids to keep your urine clear or pale yellow. Do this after quitting to flush the nicotine from your body.   Learn to predict your moods. Do not let a bad situation be your excuse to have a cigarette. Some situations in your life might tempt you into wanting a cigarette.   Never have "just one" cigarette. It leads to wanting another and another. Remind yourself of your decision to quit.   Change habits associated with smoking. If you smoked while driving or when feeling stressed, try other activities to replace smoking. Stand up when drinking your coffee. Brush your teeth after eating. Sit in a different chair when you read the paper. Avoid alcohol while trying to quit, and try to drink fewer caffeinated beverages. Alcohol and caffeine may urge you to smoke.   Avoid foods and drinks that can trigger a desire to smoke, such as sugary or spicy foods and  alcohol.   Ask people who smoke not to smoke around you.   Have something planned to do right after eating or having a cup of coffee. For example, plan to take a walk or exercise.   Try a relaxation exercise to calm you down and decrease your stress. Remember, you may be tense and nervous for the first 2 weeks after you quit, but this will pass.   Find new activities to keep your hands busy. Play with a pen, coin, or rubber band. Doodle or draw things on paper.   Brush your teeth right after eating. This will help cut down on the craving for the taste of tobacco after meals. You can also try mouthwash.   Use oral substitutes in place of cigarettes. Try using lemon drops, carrots, cinnamon sticks, or chewing gum. Keep them handy so they are available when you have the urge to smoke.   When you have the urge to smoke, try deep breathing.   Designate your home as a nonsmoking area.   If you are a heavy smoker, ask your health care provider about a prescription for nicotine chewing gum. It can ease your withdrawal from nicotine.   Reward yourself. Set aside the cigarette money you save and buy yourself something nice.   Look for support from others. Join a support group or smoking cessation program. Ask someone at home or at work to help you with your plan to quit smoking.   Always ask yourself, "Do I need this cigarette or is this just a reflex?" Tell yourself, "Today, I choose not to smoke," or "I do not want to smoke." You are reminding yourself of your decision to quit.  Do not replace cigarette smoking with electronic cigarettes (commonly called e-cigarettes). The safety of e-cigarettes is unknown, and some may contain harmful chemicals.  If you relapse, do not give up! Plan ahead and think about what you will do the next time you get the urge to smoke.  HOW WILL I FEEL WHEN I QUIT SMOKING? You may have symptoms of withdrawal because your body is used to nicotine (the  addictive substance in cigarettes). You may crave cigarettes, be irritable, feel very hungry, cough often, get headaches, or have difficulty concentrating. The withdrawal symptoms are only temporary. They are strongest when you first quit but will go away within 10 14 days. When withdrawal symptoms occur, stay in control. Think about your reasons for quitting. Remind yourself that these  are signs that your body is healing and getting used to being without cigarettes. Remember that withdrawal symptoms are easier to treat than the major diseases that smoking can cause.  Even after the withdrawal is over, expect periodic urges to smoke. However, these cravings are generally short lived and will go away whether you smoke or not. Do not smoke!  WHAT RESOURCES ARE AVAILABLE TO HELP ME QUIT SMOKING? Your health care provider can direct you to community resources or hospitals for support, which may include:  Group support.  Education.  Hypnosis.  Therapy. Document Released: 11/29/2003 Document Revised: 12/21/2012 Document Reviewed: 08/18/2012 [facility] Patient Information 2014 [place], [place].

## 2013-07-13 ENCOUNTER — Encounter (HOSPITAL_COMMUNITY): Payer: Self-pay | Admitting: Emergency Medicine

## 2013-07-13 ENCOUNTER — Emergency Department (HOSPITAL_COMMUNITY)
Admission: EM | Admit: 2013-07-13 | Discharge: 2013-07-13 | Disposition: A | Discharge status: Home / Self Care | Payer: Medicare Other | Attending: Emergency Medicine | Admitting: Emergency Medicine

## 2013-07-13 DIAGNOSIS — Z888 Allergy status to other drugs, medicaments and biological substances status: Secondary | ICD-10-CM | POA: Insufficient documentation

## 2013-07-13 DIAGNOSIS — F411 Generalized anxiety disorder: Secondary | ICD-10-CM | POA: Insufficient documentation

## 2013-07-13 DIAGNOSIS — M6281 Muscle weakness (generalized): Secondary | ICD-10-CM | POA: Insufficient documentation

## 2013-07-13 DIAGNOSIS — Z880 Allergy status to penicillin: Secondary | ICD-10-CM | POA: Insufficient documentation

## 2013-07-13 DIAGNOSIS — M949 Disorder of cartilage, unspecified: Secondary | ICD-10-CM

## 2013-07-13 DIAGNOSIS — G43109 Migraine with aura, not intractable, without status migrainosus: Secondary | ICD-10-CM

## 2013-07-13 DIAGNOSIS — G252 Other specified forms of tremor: Secondary | ICD-10-CM

## 2013-07-13 DIAGNOSIS — G250 Essential tremor: Secondary | ICD-10-CM | POA: Insufficient documentation

## 2013-07-13 DIAGNOSIS — Z881 Allergy status to other antibiotic agents status: Secondary | ICD-10-CM | POA: Insufficient documentation

## 2013-07-13 DIAGNOSIS — Z79899 Other long term (current) drug therapy: Secondary | ICD-10-CM | POA: Insufficient documentation

## 2013-07-13 DIAGNOSIS — Z882 Allergy status to sulfonamides status: Secondary | ICD-10-CM | POA: Insufficient documentation

## 2013-07-13 DIAGNOSIS — I059 Rheumatic mitral valve disease, unspecified: Secondary | ICD-10-CM | POA: Insufficient documentation

## 2013-07-13 DIAGNOSIS — L511 Stevens-Johnson syndrome: Secondary | ICD-10-CM | POA: Insufficient documentation

## 2013-07-13 DIAGNOSIS — G43809 Other migraine, not intractable, without status migrainosus: Secondary | ICD-10-CM | POA: Insufficient documentation

## 2013-07-13 DIAGNOSIS — F17200 Nicotine dependence, unspecified, uncomplicated: Secondary | ICD-10-CM | POA: Insufficient documentation

## 2013-07-13 DIAGNOSIS — F3289 Other specified depressive episodes: Secondary | ICD-10-CM | POA: Insufficient documentation

## 2013-07-13 DIAGNOSIS — G9050 Complex regional pain syndrome I, unspecified: Secondary | ICD-10-CM | POA: Insufficient documentation

## 2013-07-13 DIAGNOSIS — M899 Disorder of bone, unspecified: Secondary | ICD-10-CM | POA: Insufficient documentation

## 2013-07-13 DIAGNOSIS — Z8619 Personal history of other infectious and parasitic diseases: Secondary | ICD-10-CM | POA: Insufficient documentation

## 2013-07-13 DIAGNOSIS — K219 Gastro-esophageal reflux disease without esophagitis: Secondary | ICD-10-CM | POA: Insufficient documentation

## 2013-07-13 DIAGNOSIS — R443 Hallucinations, unspecified: Secondary | ICD-10-CM | POA: Insufficient documentation

## 2013-07-13 DIAGNOSIS — E785 Hyperlipidemia, unspecified: Secondary | ICD-10-CM | POA: Insufficient documentation

## 2013-07-13 DIAGNOSIS — F329 Major depressive disorder, single episode, unspecified: Secondary | ICD-10-CM | POA: Insufficient documentation

## 2013-07-13 MED ORDER — LORAZEPAM 1 MG PO TABS
0.5000 mg | ORAL_TABLET | Freq: Once | ORAL | Status: AC
  Administered 2013-07-13: 0.5 mg via ORAL
  Filled 2013-07-13: qty 1

## 2013-07-13 NOTE — ED Provider Notes (Addendum)
CSN: [id number]     Arrival date & time 07/13/13  1318 History   First MD Initiated Contact with Patient 07/13/13 1348     Chief Complaint  Patient presents with  . Migraine     (Consider location/radiation/quality/duration/timing/severity/associated sxs/prior Treatment) HPI  Lacey Salas presents to the ED by EMS for evaluation of an episode of twitching and weakness while speaking with her counselor. She has a history of complex migraines that present with twitching, weakness, difficulty speaking. She has seen Dr. Sandria Manly for these and has an appointment with a new neurologist coming up within the month. The patient informs me she knows exactly what happened but people who are unfamiliar with her "episodes always think she is having a stroke". The patient denies wanting any evaluation, work-up, labs or imaging done. She a long time friend with her who reports she has seen these episode frequently. The episodes ar worsened by lights, fatigue and emotional stress/trauma. The patient endorses having NUMEROUS and significant life stressors. Her father recently died and her mother is very sick. She see's Dr. Felicity Coyer for her PCP who is also aware of the patient episodes of complex migraines. She denies having headache and her friend says she is at baseline. She does not have any weakness, fevers difficulty talking or articulating. She requests to go home because she did not want to be brought to the ER.  Past Medical History  Diagnosis Date  . Anxiety   . Depression   . GERD (gastroesophageal reflux disease)   . Osteopenia   . Migraine     complicated migraines with transient R side facial paralysis and aphasia  . MVP (mitral valve prolapse)   . Stevens-Johnson disease   . Reflex sympathetic dystrophy, unspecified 2011    R hand related to wrist fx, improved s/p nerve blocks  . Hyperlipidemia   . History of chicken pox    Past Surgical History  Procedure Laterality Date  . Heel spur  surgery  12/1996    right  . Abdominal hysterectomy      BSO  . Orif distal radius fracture  2004   Family History  Problem Relation Age of Onset  . Leukemia Brother     CLL  . Arthritis Mother   . Arthritis Father   . Breast cancer Sister   . Arthritis Other   . Ovarian cancer Other   . Hyperlipidemia Other   . Hypertension Other   . Stroke Other    History  Substance Use Topics  . Smoking status: Current Every Day Smoker    Types: Cigarettes  . Smokeless tobacco: Never Used     Comment: Quit over 30 years ago  . Alcohol Use: No   OB History   Grav Para Term Preterm Abortions TAB SAB Ect Mult Living                 Review of Systems   Review of Systems  Gen: no weight loss, fevers, chills, night sweats  Eyes: no discharge or drainage, no occular pain or visual changes  Nose: no epistaxis or rhinorrhea  Mouth: no dental pain, no sore throat  Neck: no neck pain  Lungs:No wheezing, coughing or hemoptysis CV: no chest pain, palpitations, dependent edema or orthopnea  Abd: no abdominal pain, nausea, vomiting, diarrhea GU: no dysuria or gross hematuria  MSK:  No muscle weakness or pain, + transient generalized weakness and tremors. Neuro: no headache, no focal neurologic deficits  Skin: no  rash or wounds Psyche: no complaints    Allergies  Cephalexin; Epinephrine; Erythromycin; Lactated ringers; Nortriptyline; Penicillins; Sulfamethoxazole-trimethoprim; Sulfonamide derivatives; Trimethoprim; and Wellbutrin  Home Medications   Prior to Admission medications   Medication Sig Start Date End Date Taking? Authorizing Provider  atenolol (TENORMIN) 25 MG tablet Take 12.5 mg by mouth 2 (two) times daily.   Yes Historical Provider, MD  citalopram (CELEXA) 40 MG tablet Take 1 tablet (40 mg total) by mouth daily. 04/11/13  Yes Newt Lukes, MD  clonazePAM (KLONOPIN) 0.5 MG tablet Take 0.5 mg by mouth 2 (two) times daily as needed for anxiety.   Yes Historical  Provider, MD  esomeprazole (NEXIUM) 20 MG capsule Take 1 capsule (20 mg total) by mouth daily before breakfast. O[facility] 09/22/12  Yes Fransisca Kaufmann, NP  traZODone (DESYREL) 50 MG tablet Take 50 mg by mouth at bedtime as needed for sleep.   Yes Historical Provider, MD   BP 133/68  Pulse 81  Temp(Src) 100.1 F (37.8 C) (Temporal)  Resp 12  Ht 5\' 3"  (1.6 m)  Wt 138 lb (62.596 kg)  BMI 24.45 kg/m2  SpO2 97% Physical Exam  Nursing note and vitals reviewed. Constitutional: She is oriented to person, place, and time. She appears well-developed and well-nourished. No distress.  HENT:  Head: Normocephalic and atraumatic.  Eyes: Pupils are equal, round, and reactive to light.  Neck: Normal range of motion. Neck supple.  Cardiovascular: Normal rate and regular rhythm.   Pulmonary/Chest: Effort normal.  Abdominal: Soft.  Neurological: She is alert and oriented to person, place, and time. She displays tremor. No cranial nerve deficit or sensory deficit. She displays a negative Romberg sign. GCS eye subscore is 4. GCS verbal subscore is 5. GCS motor subscore is 6.  Skin: Skin is warm and dry.  Psychiatric: Her speech is normal. Judgment normal. Her mood appears anxious. She is not actively hallucinating. Cognition and memory are normal. She exhibits a depressed mood. She expresses no homicidal and no suicidal ideation.    ED Course  Procedures (including critical care time) Labs Review Labs Reviewed - No data to display  Imaging Review No results found.   EKG Interpretation None      MDM   Final diagnoses:  Complicated migraine    The patient declines wanting any blood work or imaging done for her episode. After reviewing her chart further, her migraines due present like TIA's. I discussed the case with Dr. who recommended I speak with Neurology.  Dr. Manus Gunning advises that if the patient has a neurologist, has a diagnosis, and has a known history of these exact symptoms, the  patient is back to baseline and she and her friend both say they know what happened and decline testing-- than Dr. Roseanne Reno and myself are not obligated to order labs or imaging.  With that advise I will not for the patient to sign out AMA. She will be discharged with strict return to ED precautions. Advised to continue to see her therapist and make a f/u appointment with her PCP and Neurologist.  The patient is alert and oriented to time, she is at her baseline mentation, has no neurological deficits and wishes to go home. will dc.  68 y.o.Lacey Salas's evaluation in the Emergency Department is complete. It has been determined that no acute conditions requiring further emergency intervention are present at this time. The patient/guardian have been advised of the diagnosis and plan. We have discussed signs and symptoms that warrant return  to the ED, such as changes or worsening in symptoms.  Vital signs are stable at discharge. Filed Vitals:   07/13/13 1534  BP: 133/68  Pulse: 81  Temp:   Resp: 12    Patient/guardian has voiced understanding and agreed to follow-up with the PCP or specialist.     Dorthula Matas, PA-C 07/16/13 1956  Dorthula Matas, PA-C 07/24/13 1114

## 2013-07-13 NOTE — Discharge Instructions (Signed)
Migraine Headache A migraine headache is an intense, throbbing pain on one or both sides of your head. A migraine can last for 30 minutes to several hours. CAUSES  The exact cause of a migraine headache is not always known. However, a migraine may be caused when nerves in the brain become irritated and release chemicals that cause inflammation. This causes pain. Certain things may also trigger migraines, such as:  Alcohol.  Smoking.  Stress.  Menstruation.  Aged cheeses.  Foods or drinks that contain nitrates, glutamate, aspartame, or tyramine.  Lack of sleep.  Chocolate.  Caffeine.  Hunger.  Physical exertion.  Fatigue.  Medicines used to treat chest pain (nitroglycerine), birth control pills, estrogen, and some blood pressure medicines. SIGNS AND SYMPTOMS  Pain on one or both sides of your head.  Pulsating or throbbing pain.  Severe pain that prevents daily activities.  Pain that is aggravated by any physical activity.  Nausea, vomiting, or both.  Dizziness.  Pain with exposure to bright lights, loud noises, or activity.  General sensitivity to bright lights, loud noises, or smells. Before you get a migraine, you may get warning signs that a migraine is coming (aura). An aura may include:  Seeing flashing lights.  Seeing bright spots, halos, or zig-zag lines.  Having tunnel vision or blurred vision.  Having feelings of numbness or tingling.  Having trouble talking.  Having muscle weakness. DIAGNOSIS  A migraine headache is often diagnosed based on:  Symptoms.  Physical exam.  A CT scan or MRI of your head. These imaging tests cannot diagnose migraines, but they can help rule out other causes of headaches. TREATMENT Medicines may be given for pain and nausea. Medicines can also be given to help prevent recurrent migraines.  HOME CARE INSTRUCTIONS  Only take over-the-counter or prescription medicines for pain or discomfort as directed by your  health care provider. The use of long-term narcotics is not recommended.  Lie down in a dark, quiet room when you have a migraine.  Keep a journal to find out what may trigger your migraine headaches. For example, write down:  What you eat and drink.  How much sleep you get.  Any change to your diet or medicines.  Limit alcohol consumption.  Quit smoking if you smoke.  Get 7 9 hours of sleep, or as recommended by your health care provider.  Limit stress.  Keep lights dim if bright lights bother you and make your migraines worse. SEEK IMMEDIATE MEDICAL CARE IF:   Your migraine becomes severe.  You have a fever.  You have a stiff neck.  You have vision loss.  You have muscular weakness or loss of muscle control.  You start losing your balance or have trouble walking.  You feel faint or pass out.  You have severe symptoms that are different from your first symptoms. MAKE SURE YOU:   Understand these instructions.  Will watch your condition.  Will get help right away if you are not doing well or get worse. Document Released: 03/02/2005 Document Revised: 12/21/2012 Document Reviewed: 11/07/2012 [employer] Patient Information 2014 [employer].  

## 2013-07-13 NOTE — ED Notes (Signed)
EMS - Pt was at her counselors office when she went in to an episode of twitching, experienced weakness prior to the twitching.  Pt was sitting on a couch during this period, no LOC.    Pt states this is related to complex migraines in her history.  Pt has been under an immense amount of stress with the repeat hospitalization of her moterh.

## 2013-07-18 NOTE — ED Provider Notes (Signed)
Medical screening examination/treatment/procedure(s) were performed by non-physician practitioner and as supervising physician I was immediately available for consultation/collaboration.   EKG Interpretation None        Glynn Octave, MD 07/18/13 [phone]

## 2013-07-24 NOTE — ED Provider Notes (Signed)
Medical screening examination/treatment/procedure(s) were performed by non-physician practitioner and as supervising physician I was immediately available for consultation/collaboration.   EKG Interpretation None        Laurel Harnden, MD 07/24/13 1612 

## 2013-08-03 ENCOUNTER — Ambulatory Visit: Payer: Self-pay | Admitting: Neurology

## 2013-08-24 ENCOUNTER — Encounter: Payer: Self-pay | Admitting: *Deleted

## 2013-08-30 ENCOUNTER — Ambulatory Visit (INDEPENDENT_AMBULATORY_CARE_PROVIDER_SITE_OTHER): Payer: Medicare Other | Admitting: Neurology

## 2013-08-30 ENCOUNTER — Encounter: Payer: Self-pay | Admitting: Neurology

## 2013-08-30 VITALS — BP 114/70 | HR 74 | Temp 98.6°F | Resp 18 | Ht 64.0 in | Wt 138.8 lb

## 2013-08-30 DIAGNOSIS — G43109 Migraine with aura, not intractable, without status migrainosus: Secondary | ICD-10-CM

## 2013-08-30 DIAGNOSIS — R251 Tremor, unspecified: Secondary | ICD-10-CM

## 2013-08-30 DIAGNOSIS — R51 Headache: Secondary | ICD-10-CM

## 2013-08-30 DIAGNOSIS — R569 Unspecified convulsions: Secondary | ICD-10-CM

## 2013-08-30 DIAGNOSIS — R259 Unspecified abnormal involuntary movements: Secondary | ICD-10-CM

## 2013-08-30 NOTE — Patient Instructions (Signed)
I think it is likely complicated migraine. I would definitely consider switching from celexa to Effexor as effexor is used for migraine too. I would like to repeat EEG. I would recommend not driving Follow up in 3 months.

## 2013-08-30 NOTE — Progress Notes (Signed)
NEUROLOGY CONSULTATION NOTE  Jamiera Phi MRN: [id number] DOB: 1945/11/01  Referring Etienne Mowers: Dr. Felicity Coyer Primary care Hughey Rittenberry: Dr. Felicity Coyer  Reason for consult:  Spells, tremor  HISTORY OF PRESENT ILLNESS: Lacey Salas is a 68 year old right-handed woman with history of anxiety, depression, migraine, complex regional pain syndrome of right hand, hyperlipidemia, osteopenia, MVP and Stevens-Johnson syndrome who presents for history of complex migraines and tremors.  Records and images personally reviewed.  She has been evaluated by several neurologists and headache specialists, such as Dr. Avie Echevaria, Dr. Fonnie Jarvis, Dr. Rudene Christians, Dr. Wandra Arthurs, and Dr. Marcelino Freestone.  SPELLS: Occurs spontaneously, including while driving. Onset:  Has history of typical migraines (with headache, nausea and vomiting) since age 78.  This subsided and started to have these current spells starting in her early 86s.  70% of the time it is accompanied by headache. Location:  holocephalic Quality:  Non-throbbing pressure Intensity:  5/10 Aura:  no Prodrome:  Indescribable "foreboding" feeling Associated symptoms:  Migraines typically present as TIA-like symptoms, including right-sided weakness involving face, arm and leg, photophobia, word-finding difficulties, difficulty getting words out, slurred speech, staggering gait.  She is conscious and alert but doesn't feel like she can function during the spell.  Following episodes, she is fatigued and will sleep for 2-3 hours.  On rare occasions, accompanied by field cut. Duration:  30 minutes Frequency:  Varies.  Usually once a month but may be 3 times a week. Triggers/exacerbating factors:  Chemical smells, some perfumes, bright lights Relieving factors:  Laying down Activity:  Cannot function  Past abortive therapy:  none Past preventative therapy:  Amitriptyline (initially for complex regional pain syndrome), Sinequan, onriboflavin  Current  abortive therapy:  Tylenol (for headache) Current preventative therapy:  Atenolol 12.5mg  twice daily (initially taken while on amitriptyline because the amitriptyline caused tachycardia.  Remained on it after discontinuation of amitriptyline because she said current spells started after discontinuing it and she wanted to remain on it, despite the fact that the spells have persisted).  Caffeine:  no Alcohol:  no Smoker:  Quit 34 years ago but started again due to recent tragedy Depression:  A very difficult year.  Both her parents passed away.  Her husband left her because he couldn't handle the stress related to the loss of his wife's parents. Family history of headache:  Maternal grandmother, sister  Elavil, Sinequan, Tofranil, onriboflavin 400mg ,  Citalopram 40mg , clonazepam 0.5mg , trazodone 50mg , atenolol 12.5mg  twice daily  TREMOR: Tremors started about a year ago.  She notices it when she uses her right hand, such as holding utensils or writing.  Her writing is illegible.  Denies symptoms of bradykinesia.  Denies tremor in the left hand..  06/14/08 CT HEAD:  fibrous dysplasia in the bone. 06/15/08 MRI BRAIN WO:  chronic white matter foci in the hemispheres and pons 06/15/08 MRA HEAD:  normal 11/24/11 TSH 1.73 She says she has had several MRIs in the past, as well as EEG.  PAST MEDICAL HISTORY: Past Medical History  Diagnosis Date  . Anxiety   . Depression   . GERD (gastroesophageal reflux disease)   . Osteopenia   . Migraine     complicated migraines with transient R side facial paralysis and aphasia  . MVP (mitral valve prolapse)   . Stevens-Johnson disease   . Reflex sympathetic dystrophy, unspecified 2011    R hand related to wrist fx, improved s/p nerve blocks  . Hyperlipidemia   . History of chicken pox   .  Seizures     PAST SURGICAL HISTORY: Past Surgical History  Procedure Laterality Date  . Heel spur surgery  12/1996    right  . Abdominal hysterectomy      BSO  .  Orif distal radius fracture  2004    MEDICATIONS: Current Outpatient Prescriptions on File Prior to Visit  Medication Sig Dispense Refill  . atenolol (TENORMIN) 25 MG tablet Take 12.5 mg by mouth 2 (two) times daily.      . citalopram (CELEXA) 40 MG tablet Take 1 tablet (40 mg total) by mouth daily.  90 tablet  2  . clonazePAM (KLONOPIN) 0.5 MG tablet Take 0.5 mg by mouth 2 (two) times daily as needed for anxiety.      Marland Kitchen esomeprazole (NEXIUM) 20 MG capsule Take 1 capsule (20 mg total) by mouth daily before breakfast. OVC      . traZODone (DESYREL) 50 MG tablet Take 50 mg by mouth at bedtime as needed for sleep.       No current facility-administered medications on file prior to visit.    ALLERGIES: Allergies  Allergen Reactions  . Cephalexin     REACTION: Levonne Spiller  . Epinephrine     REACTION: hypertensive reaction  . Erythromycin     REACTION: Rash  . Lactated Ringers     REACTION: cardiac symptoms  . Nortriptyline     Rapid heart rate  . Penicillins     REACTION: Rash  . Sulfamethoxazole-Trimethoprim     REACTION: Levonne Spiller  . Sulfonamide Derivatives   . Trimethoprim     REACTION: stevens johnson syndrome  . Wellbutrin [Bupropion] Other (See Comments)    tremors    FAMILY HISTORY: Family History  Problem Relation Age of Onset  . Leukemia Brother     CLL  . Arthritis Mother   . Arthritis Father   . Breast cancer Sister   . Arthritis Other   . Ovarian cancer Other   . Hyperlipidemia Other   . Hypertension Other   . Stroke Other     SOCIAL HISTORY: History   Social History  . Marital Status: Married    Spouse Name: N/A    Number of Children: N/A  . Years of Education: N/A   Occupational History  . Not on file.   Social History Main Topics  . Smoking status: Current Every Day Smoker    Types: Cigarettes  . Smokeless tobacco: Never Used     Comment: Quit over 30 years ago( just needs it a bit now ) Stress   . Alcohol Use: No  . Drug  Use: No  . Sexual Activity: Not on file   Other Topics Concern  . Not on file   Social History Narrative  . No narrative on file    REVIEW OF SYSTEMS: Constitutional: No fevers, chills, or sweats, no generalized fatigue, change in appetite Eyes: No visual changes, double vision, eye pain Ear, nose and throat: No hearing loss, ear pain, nasal congestion, sore throat Cardiovascular: No chest pain, palpitations Respiratory:  No shortness of breath at rest or with exertion, wheezes GastrointestinaI: No nausea, vomiting, diarrhea, abdominal pain, fecal incontinence Genitourinary:  No dysuria, urinary retention or frequency Musculoskeletal:  No neck pain, back pain Integumentary: No rash, pruritus, skin lesions Neurological: as above Psychiatric: No depression, insomnia, anxiety Endocrine: No palpitations, fatigue, diaphoresis, mood swings, change in appetite, change in weight, increased thirst Hematologic/Lymphatic:  No anemia, purpura, petechiae. Allergic/Immunologic: no itchy/runny eyes, nasal congestion, recent allergic  reactions, rashes  PHYSICAL EXAM: Filed Vitals:   08/30/13 0913  BP: 114/70  Pulse: 74  Temp: 98.6 F (37 C)  Resp: 18   General: No acute distress Head:  Normocephalic/atraumatic Neck: supple, no paraspinal tenderness, full range of motion Back: No paraspinal tenderness Heart: regular rate and rhythm Lungs: Clear to auscultation bilaterally. Vascular: No carotid bruits. Neurological Exam: Mental status: alert and oriented to person, place, and time, recent and remote memory intact, fund of knowledge intact, attention and concentration intact, speech fluent and not dysarthric, language intact. Cranial nerves: CN I: not tested CN II: pupils equal, round and reactive to light, visual fields intact, fundi unremarkable, without vessel changes, exudates, hemorrhages or papilledema. CN III, IV, VI:  full range of motion, no nystagmus, no ptosis CN V: facial  sensation intact CN VII: upper and lower face symmetric CN VIII: hearing intact CN IX, X: gag intact, uvula midline CN XI: sternocleidomastoid and trapezius muscles intact CN XII: tongue midline Bulk & Tone: normal, no fasciculations. Motor: 5/5 throughout.  No bradykinesia or rigidity.  No resting tremor. Sensation: Temperature and vibration intact Deep Tendon Reflexes: 2+ throughout, toes downgoing Finger to nose testing: Inconsistent and fluctuating fine tremor of both hands, right greater than left. Heel to shin: No dysmetria Gait: Normal station and stride. Able to turn and walk in tandem. Romberg negative.  IMPRESSION: 1.  Probable complicated migraine 2.  Suspect psychogenic tremor  PLAN: 1.  Will get EEG to look for any potential epileptiform activity that would change management. 2.  As she already discussed with Dr. Felicity Coyer, would favor switching from celexa to Effexor as Effexor has been shown to be effective as a migraine preventative in addition to an antidepressant. 3.  As she cannot function during spells, I would recommend not driving. 4.  Follow up in 3 months.  Thank you for allowing me to take part in the care of this patient.  Shon Millet, DO  CC:  Rene Paci, MD

## 2013-09-04 ENCOUNTER — Telehealth: Payer: Self-pay | Admitting: *Deleted

## 2013-09-04 NOTE — Telephone Encounter (Signed)
Left msg on triage stating Dr Everlena Cooper suppose to have sent md ov notes from her visit. He states effexor would be the better medicine for her to take. Requesting [facility]. Called pt back no answer LMOM (H) inform her md was out of the office. Will give her a call back on Monday with her recommendations...Raechel Chute

## 2013-09-08 MED ORDER — VENLAFAXINE HCL ER 75 MG PO CP24: 75.0000 mg | ORAL_CAPSULE | Freq: Every day | ORAL | Status: DC

## 2013-09-08 NOTE — Telephone Encounter (Signed)
Called pt no answer LMOM with md response.../lmb 

## 2013-09-08 NOTE — Telephone Encounter (Signed)
Ok DC [employer] xr 75mg  qd (near equvilant dose to celexa 40mg  qd) - erx done

## 2013-09-26 ENCOUNTER — Ambulatory Visit (INDEPENDENT_AMBULATORY_CARE_PROVIDER_SITE_OTHER): Payer: Medicare Other | Admitting: Neurology

## 2013-09-26 DIAGNOSIS — R569 Unspecified convulsions: Secondary | ICD-10-CM

## 2013-09-26 DIAGNOSIS — R51 Headache: Secondary | ICD-10-CM

## 2013-09-27 ENCOUNTER — Telehealth: Payer: Self-pay | Admitting: *Deleted

## 2013-09-27 NOTE — Procedures (Signed)
ELECTROENCEPHALOGRAM REPORT  Date of Study: 09/26/2013  Patient's Name: Lacey Salas MRN: [id number] Date of Birth: April 07, 1945  Indication: 68 year old woman with history of migraines who presents with episodic right sided weakness, slurred speech and occasional visual field cut.  Medications: Celexa, Klonopin, Trazodone  Technical Summary: This is a multichannel digital EEG recording, using the international 10-20 placement system.  Spike detection software was employed.  Description: The EEG background is symmetric, with a well-developed posterior dominant rhythm of 11-12 Hz, which is reactive to eye opening and closing.  Diffuse beta activity is seen, with a bilateral frontal preponderance.  During drowsiness, there is dropout of the posterior dominant rhythm.  No focal or generalized abnormalities are seen.  No focal or generalized epileptiform discharges are seen.  Stage II sleep is not seen.  Hyperventilation and photic stimulation were performed, and produced no abnormalities.  ECG revealed normal cardiac rate and rhythm.  Impression: This is a normal routine EEG of the awake and drowsy states, with activating procedures.  A normal study does not rule out the possibility of a seizure disorder in this patient.  Adam R. Everlena Cooper, DO

## 2013-09-27 NOTE — Telephone Encounter (Signed)
Message copied by Fredirick Maudlin on Wed Sep 27, 2013  8:36 AM ------      Message from: JAFFE, ADAM R      Created: Wed Sep 27, 2013  6:57 AM       EEG is normal      ----- Message -----         From: Cira Servant, DO         Sent: 09/27/2013   6:57 AM           To: Cira Servant, DO                   ------

## 2013-09-27 NOTE — Telephone Encounter (Signed)
Left message with patient that EEG is normal

## 2013-10-03 ENCOUNTER — Telehealth: Payer: Self-pay | Admitting: Neurology

## 2013-10-03 NOTE — Telephone Encounter (Signed)
I received a call from Ms. Skowron.  She is out of town and had another of her habitual spells.  EMS was called and they want to bring her to the local hospital.  She doesn't want to go.  I told her that if these are her typical spells, then she does not need to go to the hospital.  If she should have any new symptoms, however, then she should go.  She understood the conversation.

## 2013-10-05 ENCOUNTER — Telehealth: Payer: Self-pay | Admitting: Neurology

## 2013-10-05 NOTE — Telephone Encounter (Signed)
She would like for you to write a short letter about her dx of complicated migraines. She wants to keep this with her at all times to explain what she has, and symptoms. She states with the incident that happened earlier this week EMS called the police because she was refusing to go to the ER, they labeled her as combative.

## 2013-10-05 NOTE — Telephone Encounter (Signed)
Pt needs to talk to someone about Dr Everlena Cooper doing a letter about headache [phone]

## 2013-10-06 NOTE — Telephone Encounter (Signed)
Yes, I will draft a letter for her.

## 2013-10-09 NOTE — Telephone Encounter (Signed)
Have you done this yet? 

## 2013-10-10 ENCOUNTER — Encounter: Payer: Self-pay | Admitting: Neurology

## 2013-10-10 NOTE — Telephone Encounter (Signed)
Noted. Letter printed & left up front for p/u. Patient notified via voicemail.

## 2013-10-10 NOTE — Telephone Encounter (Signed)
Letter is drafted and in EP[employer]

## 2013-11-28 ENCOUNTER — Ambulatory Visit: Payer: Self-pay | Admitting: Neurology

## 2013-12-05 ENCOUNTER — Encounter: Payer: Self-pay | Admitting: Neurology

## 2013-12-05 ENCOUNTER — Ambulatory Visit (INDEPENDENT_AMBULATORY_CARE_PROVIDER_SITE_OTHER): Payer: Medicare Other | Admitting: Neurology

## 2013-12-05 VITALS — BP 102/68 | HR 78 | Resp 18 | Wt 138.3 lb

## 2013-12-05 DIAGNOSIS — G43409 Hemiplegic migraine, not intractable, without status migrainosus: Secondary | ICD-10-CM

## 2013-12-05 NOTE — Patient Instructions (Signed)
1.  Continue the venlafaxine and atenolol 2.  Follow up in 3 months but call with questions or concerns.

## 2013-12-05 NOTE — Progress Notes (Signed)
NEUROLOGY FOLLOW UP OFFICE NOTE  Lacey Salas [id number]  HISTORY OF PRESENT ILLNESS: Lacey Salas is a 68 year old right-handed woman with history of anxiety, depression, migraine, complex regional pain syndrome of right hand, hyperlipidemia, osteopenia, MVP and Stevens-Johnson syndrome who follows up for history of complex migraines and tremors.  Records and images personally reviewed.  She has been evaluated by several neurologists and headache specialists, such as Dr. Avie Echevaria, Dr. Fonnie Jarvis, Dr. Rudene Christians, Dr. Wandra Arthurs, and Dr. Marcelino Freestone.  UPDATE: In July, she was out of town and had another complicated migraine.  EMS wanted to take her to the hospital, but she refused since these were her habitual spells.  She has not had another migraine attack.  EEG performed 09/26/13 was normal.  Current abortive therapy:  Tylenol (for headache) Current preventative therapy:  Atenolol 12.5mg  twice daily, started on venlafaxine XR 75mg   For the past week, she notes a heavy feeling in her right hand.  She has been taking a Bible study class, which involves a lot of homework, so she is writing often.  She denies pain.  SPELLS: Occurs spontaneously, including while driving. Onset:  Has history of typical migraines (with headache, nausea and vomiting) since age 28.  This subsided and started to have these current spells starting in her early 40s.  70% of the time it is accompanied by headache. Location:  holocephalic Quality:  Non-throbbing pressure Initial intensity:  5/10 Aura:  no Prodrome:  Indescribable "foreboding" feeling Associated symptoms:  Migraines typically present as TIA-like symptoms, including right-sided weakness involving face, arm and leg, photophobia, word-finding difficulties, difficulty getting words out, slurred speech, staggering gait.  She is conscious and alert but doesn't feel like she can function during the spell.  Following episodes, she is fatigued  and will sleep for 2-3 hours.  On rare occasions, accompanied by field cut. Initial duration:  30 minutes Initial frequency:  Varies.  Usually once a month but may be 3 times a week. Triggers/exacerbating factors:  Chemical smells, some perfumes, bright lights Relieving factors:  Laying down Activity:  Cannot function  Past abortive therapy:  none Past preventative therapy:  Amitriptyline (initially for complex regional pain syndrome), Sinequan, onriboflavin  Caffeine:  no Alcohol:  no Smoker:  Quit 34 years ago but started again due to recent tragedy Depression:  A very difficult year.  Both her parents passed away.  Her husband left her because he couldn't handle the stress related to the loss of his wife's parents. Family history of headache:  Maternal grandmother, sister  Elavil, Sinequan, Tofranil, onriboflavin 400mg ,   Citalopram 40mg , clonazepam 0.5mg , trazodone 50mg , atenolol 12.5mg  twice daily  TREMOR: Tremors started about a year ago.  She notices it when she uses her right hand, such as holding utensils or writing.  Her writing is illegible.  Denies symptoms of bradykinesia.  Denies tremor in the left hand..  06/14/08 CT HEAD:  fibrous dysplasia in the bone. 06/15/08 MRI BRAIN WO:  chronic white matter foci in the hemispheres and pons 06/15/08 MRA HEAD:  normal 11/24/11 TSH 1.73 She says she has had several MRIs in the past, as well as EEG.  PAST MEDICAL HISTORY: Past Medical History  Diagnosis Date  . Anxiety   . Depression   . GERD (gastroesophageal reflux disease)   . Osteopenia   . Migraine     complicated migraines with transient R side facial paralysis and aphasia  . MVP (mitral valve prolapse)   .  Stevens-Johnson disease   . Reflex sympathetic dystrophy, unspecified 2011    R hand related to wrist fx, improved s/p nerve blocks  . Hyperlipidemia   . History of chicken pox   . Seizures     MEDICATIONS: Current Outpatient Prescriptions on File Prior to Visit    Medication Sig Dispense Refill  . atenolol (TENORMIN) 25 MG tablet Take 12.5 mg by mouth 2 (two) times daily.      . clonazePAM (KLONOPIN) 0.5 MG tablet Take 0.5 mg by mouth 2 (two) times daily as needed for anxiety.      Marland Kitchen esomeprazole (NEXIUM) 20 MG capsule Take 1 capsule (20 mg total) by mouth daily before breakfast. OVC      . traZODone (DESYREL) 50 MG tablet Take 50 mg by mouth at bedtime as needed for sleep.      Marland Kitchen venlafaxine XR (EFFEXOR XR) 75 MG 24 hr capsule Take 1 capsule (75 mg total) by mouth daily with breakfast.  90 capsule  1   No current facility-administered medications on file prior to visit.    ALLERGIES: Allergies  Allergen Reactions  . Cephalexin     REACTION: Levonne Spiller  . Epinephrine     REACTION: hypertensive reaction  . Erythromycin     REACTION: Rash  . Lactated Ringers     REACTION: cardiac symptoms  . Nortriptyline     Rapid heart rate  . Penicillins     REACTION: Rash  . Sulfamethoxazole-Trimethoprim     REACTION: Levonne Spiller  . Sulfonamide Derivatives   . Trimethoprim     REACTION: stevens johnson syndrome  . Wellbutrin [Bupropion] Other (See Comments)    tremors    FAMILY HISTORY: Family History  Problem Relation Age of Onset  . Leukemia Brother     CLL  . Arthritis Mother   . Arthritis Father   . Breast cancer Sister   . Arthritis Other   . Ovarian cancer Other   . Hyperlipidemia Other   . Hypertension Other   . Stroke Other     SOCIAL HISTORY: History   Social History  . Marital Status: Married    Spouse Name: N/A    Number of Children: N/A  . Years of Education: N/A   Occupational History  . Not on file.   Social History Main Topics  . Smoking status: Current Every Day Smoker    Types: Cigarettes  . Smokeless tobacco: Never Used     Comment: Quit over 30 years ago( just needs it a bit now ) Stress   . Alcohol Use: No  . Drug Use: No  . Sexual Activity: No   Other Topics Concern  . Not on file    Social History Narrative  . No narrative on file    REVIEW OF SYSTEMS: Constitutional: No fevers, chills, or sweats, no generalized fatigue, change in appetite Eyes: No visual changes, double vision, eye pain Ear, nose and throat: No hearing loss, ear pain, nasal congestion, sore throat Cardiovascular: No chest pain, palpitations Respiratory:  No shortness of breath at rest or with exertion, wheezes GastrointestinaI: No nausea, vomiting, diarrhea, abdominal pain, fecal incontinence Genitourinary:  No dysuria, urinary retention or frequency Musculoskeletal:  No neck pain, back pain Integumentary: No rash, pruritus, skin lesions Neurological: as above Psychiatric: Depression, anxiety Endocrine: No palpitations, fatigue, diaphoresis, mood swings, change in appetite, change in weight, increased thirst Hematologic/Lymphatic:  No anemia, purpura, petechiae. Allergic/Immunologic: no itchy/runny eyes, nasal congestion, recent allergic reactions, rashes  PHYSICAL EXAM: Filed Vitals:   12/05/13 1449  BP: 102/68  Pulse: 78  Resp: 18   General: No acute distress Head:  Normocephalic/atraumatic Neck: supple, no paraspinal tenderness, full range of motion Heart:  Regular rate and rhythm Lungs:  Clear to auscultation bilaterally Back: No paraspinal tenderness Neurological Exam: alert and oriented to person, place, and time. Attention span and concentration intact, recent and remote memory intact, fund of knowledge intact.  Speech fluent and not dysarthric, language intact.  CN II-XII intact. Fundoscopic exam unremarkable without vessel changes, exudates, hemorrhages or papilledema.  Bulk and tone normal, muscle strength 5/5 throughout.  Sensation to light touch intact.  Deep tendon reflexes 2+ throughout.  Finger to nose intact.  Gait normal, Romberg negative.  IMPRESSION: Hemiplegic migraine I don't appreciate any hand weakness on exam.  It may be tiredness due to excessive writing for her  class.  I really don't see any indication for stroke.  She will monitor for now.  PLAN: 1.  Continue venlafaxine and atenolol 2.  Provided her a letter stating that she has these migraines presenting as stroke-like symptoms, so she can present it if she should ever have another spell.  Of course, if there is any change to her habitual spells, she should go to the ED to be evaluated. 3.  Follow up to 3 months.  Shon Millet, DO  CC:  Rene Paci, MD

## 2014-01-09 ENCOUNTER — Encounter: Payer: Medicare Other | Admitting: Internal Medicine

## 2014-01-09 ENCOUNTER — Other Ambulatory Visit (INDEPENDENT_AMBULATORY_CARE_PROVIDER_SITE_OTHER): Payer: Medicare Other

## 2014-01-09 ENCOUNTER — Ambulatory Visit (INDEPENDENT_AMBULATORY_CARE_PROVIDER_SITE_OTHER): Payer: Medicare Other | Admitting: Family

## 2014-01-09 ENCOUNTER — Encounter: Payer: Self-pay | Admitting: Family

## 2014-01-09 VITALS — BP 118/78 | HR 71 | Temp 98.1°F | Resp 18 | Ht 63.5 in | Wt 139.8 lb

## 2014-01-09 DIAGNOSIS — Z1231 Encounter for screening mammogram for malignant neoplasm of breast: Secondary | ICD-10-CM

## 2014-01-09 DIAGNOSIS — F411 Generalized anxiety disorder: Secondary | ICD-10-CM

## 2014-01-09 DIAGNOSIS — Z0000 Encounter for general adult medical examination without abnormal findings: Secondary | ICD-10-CM

## 2014-01-09 DIAGNOSIS — Z23 Encounter for immunization: Secondary | ICD-10-CM

## 2014-01-09 DIAGNOSIS — E785 Hyperlipidemia, unspecified: Secondary | ICD-10-CM

## 2014-01-09 LAB — LIPID PANEL
Cholesterol: 276 mg/dL — ABNORMAL HIGH (ref 0–200)
HDL: 45.5 mg/dL (ref >39.00)
LDL Cholesterol: 201 mg/dL — ABNORMAL HIGH (ref 0–99)
NONHDL: 230.5
Total CHOL/HDL Ratio: 6
Triglycerides: 146 mg/dL (ref 0.0–149.0)
VLDL: 29.2 mg/dL (ref 0.0–40.0)

## 2014-01-09 LAB — BASIC METABOLIC PANEL
BUN: 11 mg/dL (ref 6–23)
CO2: 19 mEq/L (ref 19–32)
Calcium: 9.1 mg/dL (ref 8.4–10.5)
Chloride: 107 mEq/L (ref 96–112)
Creatinine, Ser: 0.6 mg/dL (ref 0.4–1.2)
GFR: 98.32 mL/min (ref >60.00)
Glucose, Bld: 92 mg/dL (ref 70–99)
Potassium: 3.8 mEq/L (ref 3.5–5.1)
Sodium: 139 mEq/L (ref 135–145)

## 2014-01-09 LAB — CBC
HCT: 41.8 % (ref 36.0–46.0)
Hemoglobin: 13.8 g/dL (ref 12.0–15.0)
MCHC: 33.1 g/dL (ref 30.0–36.0)
MCV: 90.9 fl (ref 78.0–100.0)
PLATELETS: 344 10*3/uL (ref 150.0–400.0)
RBC: 4.6 Mil/uL (ref 3.87–5.11)
RDW: 13.3 % (ref 11.5–15.5)
WBC: 8.9 10*3/uL (ref 4.0–10.5)

## 2014-01-09 NOTE — Patient Instructions (Signed)
Thank you for choosing [facility].  Summary/Instructions:   Please stop by the lab prior to leaving for your blood work - we will be in touch with the results when we receive them  You should hear back about scheduling your mammogram within about a week. Please let us know if you do not.   Plan to follow up in a year for your next physical or sooner if needed.  It was a pleasure meeting you today. Keep up the good work and let us know if we can help in any way!

## 2014-01-09 NOTE — Progress Notes (Signed)
Pre visit review using our clinic review tool, if applicable. No additional management support is needed unless otherwise documented below in the visit note. 

## 2014-01-09 NOTE — Progress Notes (Signed)
Subjective:    Patient ID: Lacey Salas, female    DOB: [birth year], 68 y.o.   MRN: [id number]  Chief Complaint  Patient presents with  . CPE    Basic blood work and wants to check to see if there is a tick on her stomach, has a black speck    HPI:  Lacey Salas is a 67 y.o. female who presents today for an annual wellness visit.  1) Health Maintenance - Describes an overall good year. Parents have passed away recently and her sister is in hospice.   Diet - Doing good - eating a variety and moderation. Trying to watch what she eats.  Exercise - Not doing as much as she should, loves to garden.   Wt Readings from Last 3 Encounters:  01/09/14 139 lb 12.8 oz (63.413 kg)  12/05/13 138 lb 4.8 oz (62.732 kg)  08/30/13 138 lb 12.8 oz (62.959 kg)   2) Preventative Exams / Immunizations:  Dental -- Up to date Vision -- Up to date Immunizations -- Will get flu shot. Information requested about PPSV23 Colonoscopy -- Up to date Mammogram -- Will be scheduled Bone Density -- Up to date  Review of Systems  Constitutional: Denies fever, chills, fatigue, or significant weight gain/loss. HENT: Head: Denies headache or neck pain Ears: Denies changes in hearing, ringing in ears, earache, drainage Nose: Denies discharge, stuffiness, itching, nosebleed, sinus pain Throat: Denies sore throat, hoarseness, dry mouth, sores, thrush Eyes: Denies loss/changes in vision, pain, redness, blurry/double vision, flashing  Cardiovascular: Denies chest pain/discomfort, tightness, palpitations, shortness of breath with activity, difficulty lying down, swelling, sudden awakening with shortness of breath Respiratory: Denies shortness of breath, cough, sputum production, wheezing Gastrointestinal: Denies dysphasia, heartburn, change in appetite, nausea, change in bowel habits, rectal bleeding, constipation, diarrhea, yellow skin or eyes Genitourinary: Denies frequency, urgency, burning/pain, blood in urine,  incontinence, change in urinary strength. Musculoskeletal: Denies muscle/joint pain, stiffness, back pain, redness or swelling of joints, trauma Skin: Denies rashes, lumps, itching, dryness, color changes, or hair/nail changes Neurological: Denies dizziness, fainting, seizures, weakness, numbness, tingling, tremor Psychiatric - Denies nervousness, stress, depression or memory loss Stress as descried above.  Endocrine: Denies heat or cold intolerance, sweating, frequent urination, excessive thirst, changes in appetite Hematologic: Denies ease of bruising or bleeding    Objective:    BP 118/78  Pulse 71  Temp(Src) 98.1 F (36.7 C) (Oral)  Resp 18  Ht 5' 3.5" (1.613 m)  Wt 139 lb 12.8 oz (63.413 kg)  BMI 24.37 kg/m2  SpO2 98% Nursing note and vital signs reviewed. Female chaperone present for breast exam.  Physical Exam  Constitutional: She is oriented to person, place, and time. She appears well-developed and well-nourished. No distress.  HENT:  Head: Normocephalic.  Right Ear: Hearing, tympanic membrane, external ear and ear canal normal.  Left Ear: Hearing, tympanic membrane, external ear and ear canal normal.  Nose: Nose normal.  Mouth/Throat: Uvula is midline, oropharynx is clear and moist and mucous membranes are normal.  Eyes: Conjunctivae and EOM are normal. Pupils are equal, round, and reactive to light.  Neck: Normal range of motion. Neck supple. No JVD present. No tracheal deviation present. No thyromegaly present.  Cardiovascular: Normal rate, regular rhythm and normal heart sounds.   Pulmonary/Chest: Effort normal and breath sounds normal. Right breast exhibits no inverted nipple, no mass, no nipple discharge, no skin change and no tenderness. Left breast exhibits no inverted nipple, no mass, no nipple discharge, no skin change  and no tenderness.  Abdominal: Soft. Bowel sounds are normal. She exhibits no distension and no mass. There is no tenderness. There is no rebound  and no guarding.  Musculoskeletal: Normal range of motion.  Lymphadenopathy:    She has no cervical adenopathy.  Neurological: She is alert and oriented to person, place, and time. She has normal reflexes. No cranial nerve deficit. Coordination normal.  Skin: Skin is warm and dry.  Psychiatric: She has a normal mood and affect. Her behavior is normal. Judgment and thought content normal.       Assessment & Plan:

## 2014-01-09 NOTE — Assessment & Plan Note (Signed)
1) Anticipatory Guidance: Discussed importance of wearing seatbelt and not texting and driving; discussed wearing suntan lotion when outdoors to protect skin; discussed changing batteries in the smoke detectors annually.   2) Immunizations / Screenings / Labs:  Flu shot received today. Discussed risks and benefits of PPSV23 and PPSV 13, declined Zostovax / Order sent to schedule mammogram - all other screenings up to date / Obtain CBC, BMET, TSH, Lipid profile.   Overall normal well exam - follow up in a year or sooner if needed.

## 2014-01-11 ENCOUNTER — Telehealth: Payer: Self-pay | Admitting: Family

## 2014-01-11 LAB — TSH: TSH: 1.37 u[IU]/mL (ref 0.35–4.50)

## 2014-01-11 MED ORDER — ATORVASTATIN CALCIUM 10 MG PO TABS: 10.0000 mg | ORAL_TABLET | Freq: Every day | ORAL | Status: DC

## 2014-01-11 NOTE — Telephone Encounter (Signed)
Please call the patient to inform her that majority of her labs were within the normal expected ranges. The exception is her cholesterol. Her total cholesterol is 276 for which the goal is <200 and her LDL or bad cholesterol is 474 and the goal is <100. With these numbers it is recommended to start medication to help control her cholesterol while also working to increase fruits, vegetables, and fiber in her diet while decreasing saturated fat. Physical activity is also recommended 30 minutes most days of the week. I have sent a prescription to her pharmacy for her to start with her cholesterol. If she experiences any new or increased muscle pain with the medication, please let us know. Otherwise plan to follow up in about 6 months.

## 2014-01-11 NOTE — Telephone Encounter (Signed)
Called pt to let her know her labs were all within normal limits with the exception of her cholesterol being high. I told her that she had medicine called into her pharmacy to help lower her cholesterol but she insisted that she wants to do it on her own without any medication. I told her to just increase her fruit, vegetables, and fiber intake and try to get a good amount of exercise through the week.

## 2014-03-06 ENCOUNTER — Other Ambulatory Visit: Payer: Self-pay | Admitting: Internal Medicine

## 2014-03-06 ENCOUNTER — Ambulatory Visit (INDEPENDENT_AMBULATORY_CARE_PROVIDER_SITE_OTHER): Payer: Medicare Other | Admitting: Neurology

## 2014-03-06 ENCOUNTER — Encounter: Payer: Self-pay | Admitting: Neurology

## 2014-03-06 VITALS — BP 102/68 | HR 84 | Ht 63.5 in | Wt 143.0 lb

## 2014-03-06 DIAGNOSIS — F329 Major depressive disorder, single episode, unspecified: Secondary | ICD-10-CM

## 2014-03-06 DIAGNOSIS — R251 Tremor, unspecified: Secondary | ICD-10-CM

## 2014-03-06 DIAGNOSIS — F32A Depression, unspecified: Secondary | ICD-10-CM

## 2014-03-06 DIAGNOSIS — R413 Other amnesia: Secondary | ICD-10-CM

## 2014-03-06 DIAGNOSIS — G43409 Hemiplegic migraine, not intractable, without status migrainosus: Secondary | ICD-10-CM

## 2014-03-06 NOTE — Progress Notes (Signed)
NEUROLOGY FOLLOW UP OFFICE NOTE  Zahava Quant [id number]  HISTORY OF PRESENT ILLNESS: Lacey Salas is a 68 year old right-handed woman with history of anxiety, depression, migraine, complex regional pain syndrome of right hand, hyperlipidemia, osteopenia, MVP and Stevens-Johnson syndrome who follows up for history of complex migraines and tremors.   UPDATE: Migraines have been well-controlled.  She only had one migraine without headache since July.  She reports increased problems with memory.  For example, she will get disoriented sometimes while driving or when walking out of a store.  She denies having missed paying bills.  No problems with names or faces.  She has been depressed relating to the recent passing of her younger sister to breast cancer, as well as still hurting from having lost her parents and separating from her husband  Current abortive therapy:  Tylenol (for headache) Current preventative therapy:  Atenolol 12.5mg  twice daily, venlafaxine XR 75mg   SPELLS: She has been evaluated by several neurologists and headache specialists, such as Dr. , Dr. Avie Echevaria, Dr. Fonnie Jarvis, Dr. Rudene Christians, and Dr. Wandra Arthurs. Occurs spontaneously, including while driving. Onset:  Has history of typical migraines (with headache, nausea and vomiting) since age 63.  This subsided and started to have these current spells starting in her early 16s.  70% of the time it is accompanied by headache. Location:  holocephalic Quality:  Non-throbbing pressure Initial intensity:  5/10 Aura:  no Prodrome:  Indescribable "foreboding" feeling Associated symptoms:  Migraines typically present as TIA-like symptoms, including right-sided weakness involving face, arm and leg, photophobia, word-finding difficulties, difficulty getting words out, slurred speech, staggering gait.  She is conscious and alert but doesn't feel like she can function during the spell.  Following episodes, she is  fatigued and will sleep for 2-3 hours.  On rare occasions, accompanied by field cut. Initial duration:  30 minutes Initial frequency:  Varies.  Usually once a month but may be 3 times a week. Triggers/exacerbating factors:  Chemical smells, some perfumes, bright lights Relieving factors:  Laying down Activity:  Cannot function  Past abortive therapy:  none Past preventative therapy:  Amitriptyline (initially for complex regional pain syndrome), Sinequan, onriboflavin  Caffeine:  no Alcohol:  no Smoker:  Quit 34 years ago but started again due to recent tragedy Depression:  A very difficult year.  Both her parents passed away.  Her husband left her because he couldn't handle the stress related to the loss of his wife's parents. Family history of headache:  Maternal grandmother, sister  Elavil, Sinequan, Tofranil, onriboflavin 400mg ,    Citalopram 40mg , clonazepam 0.5mg , trazodone 50mg , atenolol 12.5mg  twice daily  TREMOR: Tremors started about a year ago.  She notices it when she uses her right hand, such as holding utensils or writing.  Her writing is illegible.  Denies symptoms of bradykinesia.  Denies tremor in the left hand..  06/14/08 CT HEAD:  fibrous dysplasia in the bone. 06/15/08 MRI BRAIN WO:  chronic white matter foci in the hemispheres and pons 06/15/08 MRA HEAD:  normal 11/24/11 TSH 1.73 EEG performed 09/26/13 was normal. She says she has had several MRIs in the past, as well as EEG.  PAST MEDICAL HISTORY: Past Medical History  Diagnosis Date  . Anxiety   . Depression   . GERD (gastroesophageal reflux disease)   . Osteopenia   . Migraine     complicated migraines with transient R side facial paralysis and aphasia  . MVP (mitral valve prolapse)   .  Stevens-Johnson disease   . Reflex sympathetic dystrophy, unspecified 2011    R hand related to wrist fx, improved s/p nerve blocks  . Hyperlipidemia   . History of chicken pox   . Seizures     MEDICATIONS: Current  Outpatient Prescriptions on File Prior to Visit  Medication Sig Dispense Refill  . atenolol (TENORMIN) 25 MG tablet Take 12.5 mg by mouth 2 (two) times daily.    Marland Kitchen esomeprazole (NEXIUM) 20 MG capsule Take 1 capsule (20 mg total) by mouth daily before breakfast. OVC     No current facility-administered medications on file prior to visit.    ALLERGIES: Allergies  Allergen Reactions  . Cephalexin     REACTION: Levonne Spiller  . Epinephrine     REACTION: hypertensive reaction  . Erythromycin     REACTION: Rash  . Lactated Ringers     REACTION: cardiac symptoms  . Nortriptyline     Rapid heart rate  . Penicillins     REACTION: Rash  . Sulfamethoxazole-Trimethoprim     REACTION: Levonne Spiller  . Sulfonamide Derivatives   . Trimethoprim     REACTION: stevens johnson syndrome  . Wellbutrin [Bupropion] Other (See Comments)    tremors    FAMILY HISTORY: Family History  Problem Relation Age of Onset  . Leukemia Brother     CLL  . Arthritis Mother   . Arthritis Father   . Breast cancer Sister   . Arthritis Other   . Ovarian cancer Other   . Hyperlipidemia Other   . Hypertension Other   . Stroke Other     SOCIAL HISTORY: History   Social History  . Marital Status: Married    Spouse Name: N/A    Number of Children: N/A  . Years of Education: N/A   Occupational History  . Not on file.   Social History Main Topics  . Smoking status: Current Every Day Smoker    Types: Cigarettes  . Smokeless tobacco: Never Used     Comment: Quit over 30 years ago( just needs it a bit now ) Stress   . Alcohol Use: No  . Drug Use: No  . Sexual Activity: No   Other Topics Concern  . Not on file   Social History Narrative    REVIEW OF SYSTEMS: Constitutional: No fevers, chills, or sweats, no generalized fatigue, change in appetite Eyes: No visual changes, double vision, eye pain Ear, nose and throat: No hearing loss, ear pain, nasal congestion, sore throat Cardiovascular:  No chest pain, palpitations Respiratory:  No shortness of breath at rest or with exertion, wheezes GastrointestinaI: No nausea, vomiting, diarrhea, abdominal pain, fecal incontinence Genitourinary:  No dysuria, urinary retention or frequency Musculoskeletal:  No neck pain, back pain Integumentary: No rash, pruritus, skin lesions Neurological: as above Psychiatric: Depression Endocrine: No palpitations, fatigue, diaphoresis, mood swings, change in appetite, change in weight, increased thirst Hematologic/Lymphatic:  No anemia, purpura, petechiae. Allergic/Immunologic: no itchy/runny eyes, nasal congestion, recent allergic reactions, rashes  PHYSICAL EXAM: Filed Vitals:   03/06/14 1336  BP: 102/68  Pulse: 84   General: No acute distress Head:  Normocephalic/atraumatic Eyes:  Fundoscopic exam unremarkable without vessel changes, exudates, hemorrhages or papilledema. Neck: supple, no paraspinal tenderness, full range of motion Heart:  Regular rate and rhythm Lungs:  Clear to auscultation bilaterally Back: No paraspinal tenderness Neurological Exam: alert and oriented to person, place, and time. Attention span and concentration intact, recent and remote memory intact, fund of knowledge intact.  Speech fluent and not dysarthric, language intact.   Montreal Cognitive Assessment  03/06/2014  Visuospatial/ Executive (0/5) 4  Naming (0/3) 3  Attention: Read list of digits (0/2) 2  Attention: Read list of letters (0/1) 1  Attention: Serial 7 subtraction starting at 100 (0/3) 2  Language: Repeat phrase (0/2) 2  Language : Fluency (0/1) 1  Abstraction (0/2) 2  Delayed Recall (0/5) 2  Orientation (0/6) 6  Total 25  Adjusted Score (based on education) 25   CN II-XII intact. Fundoscopic exam unremarkable without vessel changes, exudates, hemorrhages or papilledema.  Bulk and tone normal, muscle strength 5/5 throughout.  Tremor noted while writing.  Sensation to light touch intact.  Deep tendon  reflexes 2+ throughout.  Finger to nose intact.  Gait normal, Romberg negative.  IMPRESSION: Hemiplegic migraine, stable Memory problems, likely related to depression rather than organic Tremor  PLAN: 1.  Continue atenolol and venlafaxine ER 2.  Will monitor and recheck memory testing in 6 months.  30 minutes spent with patient, over 50% spent discussing management.  Shon Millet, DO  CC: Rene Paci, MD

## 2014-03-06 NOTE — Patient Instructions (Addendum)
1.  Continue atenolol and venlafaxine ER 2.  Follow up in 6 months.

## 2014-03-07 ENCOUNTER — Telehealth: Payer: Self-pay | Admitting: Internal Medicine

## 2014-03-07 NOTE — Telephone Encounter (Signed)
emmi emailed °

## 2014-03-21 ENCOUNTER — Telehealth: Payer: Self-pay | Admitting: Internal Medicine

## 2014-03-21 MED ORDER — TRAZODONE HCL 50 MG PO TABS: 50.0000 mg | ORAL_TABLET | Freq: Every evening | ORAL | Status: DC | PRN

## 2014-03-21 NOTE — Telephone Encounter (Signed)
Called pt no answer LMOM medication is still good 6 months after the expiration date, but will send updated 30 day to [facility].../lmb

## 2014-03-21 NOTE — Telephone Encounter (Signed)
Pt calling stating she has a few trazadone left but pill bottle says to discard after 10/20/13, can she take these or does she need a new prescription, pls advise

## 2014-05-09 ENCOUNTER — Other Ambulatory Visit: Payer: Self-pay | Admitting: Internal Medicine

## 2014-05-16 ENCOUNTER — Encounter: Payer: Self-pay | Admitting: Internal Medicine

## 2014-05-16 ENCOUNTER — Ambulatory Visit (INDEPENDENT_AMBULATORY_CARE_PROVIDER_SITE_OTHER): Payer: Medicare Other | Admitting: Internal Medicine

## 2014-05-16 VITALS — BP 150/98 | HR 88 | Temp 98.0°F | Resp 17 | Ht 64.0 in | Wt 144.5 lb

## 2014-05-16 DIAGNOSIS — I10 Essential (primary) hypertension: Secondary | ICD-10-CM

## 2014-05-16 DIAGNOSIS — F32A Depression, unspecified: Secondary | ICD-10-CM

## 2014-05-16 DIAGNOSIS — F329 Major depressive disorder, single episode, unspecified: Secondary | ICD-10-CM

## 2014-05-16 DIAGNOSIS — S4992XA Unspecified injury of left shoulder and upper arm, initial encounter: Secondary | ICD-10-CM

## 2014-05-16 MED ORDER — LOSARTAN POTASSIUM-HCTZ 50-12.5 MG PO TABS: 1.0000 | ORAL_TABLET | Freq: Every day | ORAL | Status: DC

## 2014-05-16 NOTE — Progress Notes (Deleted)
Pre visit review using our clinic review tool, if applicable. No additional management support is needed unless otherwise documented below in the visit note. 

## 2014-05-16 NOTE — Patient Instructions (Addendum)
Use warm moist compresses 3- 4 times a day to the affected area. Please discuss with your counselor as to additional interventions to address the emotional pain & guilt you are experiencing. Minimal Blood Pressure Goal= AVERAGE < 140/90;  Ideal is an AVERAGE < 135/85. This AVERAGE should be calculated from @ least 5-7 BP readings taken @ different times of day on different days of week. You should not respond to isolated BP readings , but rather the AVERAGE for that week .Please bring your  blood pressure cuff to office visits to verify that it is reliable.It  can also be checked against the blood pressure device at the pharmacy. Finger or wrist cuffs are not dependable; an arm cuff is.
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
## 2014-05-17 ENCOUNTER — Telehealth: Payer: Self-pay | Admitting: Internal Medicine

## 2014-05-17 NOTE — Telephone Encounter (Signed)
emmi emailed °

## 2014-05-18 ENCOUNTER — Telehealth: Payer: Self-pay | Admitting: Internal Medicine

## 2014-05-18 NOTE — Telephone Encounter (Signed)
emmi emailed °

## 2014-06-05 ENCOUNTER — Other Ambulatory Visit: Payer: Self-pay | Admitting: Internal Medicine

## 2014-06-11 ENCOUNTER — Other Ambulatory Visit: Payer: Self-pay

## 2014-06-11 DIAGNOSIS — I10 Essential (primary) hypertension: Secondary | ICD-10-CM

## 2014-06-11 MED ORDER — CLONAZEPAM 0.5 MG PO TABS: 0.5000 mg | ORAL_TABLET | Freq: Two times a day (BID) | ORAL | Status: DC | PRN

## 2014-06-11 MED ORDER — LOSARTAN POTASSIUM-HCTZ 50-12.5 MG PO TABS: 1.0000 | ORAL_TABLET | Freq: Every day | ORAL | Status: DC

## 2014-06-11 MED ORDER — ATENOLOL 25 MG PO TABS
12.5000 mg | ORAL_TABLET | Freq: Two times a day (BID) | ORAL | Status: DC
Start: 2014-06-11 — End: 2015-09-02

## 2014-06-11 NOTE — Telephone Encounter (Signed)
[facility].../[facility] 

## 2014-06-11 NOTE — Telephone Encounter (Signed)
Printed, signed - in [employer] folder

## 2014-06-12 ENCOUNTER — Telehealth: Payer: Self-pay

## 2014-06-12 NOTE — Telephone Encounter (Signed)
PA for clonazepam 0.5mg  started via covermymeds.

## 2014-08-04 ENCOUNTER — Other Ambulatory Visit: Payer: Self-pay | Admitting: Internal Medicine

## 2014-12-08 ENCOUNTER — Other Ambulatory Visit: Payer: Self-pay | Admitting: Internal Medicine

## 2015-03-19 ENCOUNTER — Other Ambulatory Visit: Payer: Self-pay | Admitting: Internal Medicine

## 2015-03-19 NOTE — Telephone Encounter (Signed)
Pt called to check up on this request, pt has an appt with Dr. Okey Dupre on 03/29/15. Please help, she is completely out of this med

## 2015-03-29 ENCOUNTER — Ambulatory Visit (INDEPENDENT_AMBULATORY_CARE_PROVIDER_SITE_OTHER): Payer: Medicare Other | Admitting: Internal Medicine

## 2015-03-29 ENCOUNTER — Encounter: Payer: Self-pay | Admitting: Internal Medicine

## 2015-03-29 VITALS — BP 138/78 | HR 85 | Temp 97.9°F | Resp 12 | Ht 63.5 in | Wt 147.0 lb

## 2015-03-29 DIAGNOSIS — Z0000 Encounter for general adult medical examination without abnormal findings: Secondary | ICD-10-CM | POA: Diagnosis not present

## 2015-03-29 DIAGNOSIS — R21 Rash and other nonspecific skin eruption: Secondary | ICD-10-CM

## 2015-03-29 DIAGNOSIS — F411 Generalized anxiety disorder: Secondary | ICD-10-CM

## 2015-03-29 NOTE — Assessment & Plan Note (Signed)
Unclear etiology and offered cream for relief but she has some leftover at home from an episode that she will use. If no improvement she will call. Could not recall the name of this medicine.

## 2015-03-29 NOTE — Progress Notes (Signed)
   Subjective:    Patient ID: Lacey Salas, female    DOB: [birth year], 70 y.o.   MRN: [id number]  HPI The patient is a 70 YO female coming in for rash on her shoulder and her thighs. Denies new soaps, detergent, shampoo, exposure. No new outdoor activity. No new clothing. Has tried lotion which was not helpful. No new medicines or supplements. No fevers or chills.  Second problem is her mood. On venlafaxine and doing okay but has had a lot of changes in her life in the last 3 years which she is struggling to adapt to. She has done some christian counseling which was minimally helpful. Going through separation with her husband which is devastating to her.   Review of Systems  Constitutional: Negative for fever, activity change, appetite change, fatigue and unexpected weight change.  HENT: Negative.   Eyes: Negative.   Respiratory: Negative for cough, chest tightness, shortness of breath and wheezing.   Cardiovascular: Negative for chest pain, palpitations and leg swelling.  Gastrointestinal: Negative for nausea, abdominal pain, diarrhea, constipation and abdominal distention.  Skin: Positive for rash. Negative for pallor and wound.  Psychiatric/Behavioral: Positive for dysphoric mood and decreased concentration.      Objective:   Physical Exam  Constitutional: She appears well-developed and well-nourished.  HENT:  Head: Normocephalic and atraumatic.  Eyes: EOM are normal.  Neck: Normal range of motion.  Cardiovascular: Normal rate and regular rhythm.   Pulmonary/Chest: Effort normal and breath sounds normal. No respiratory distress. She has no wheezes. She has no rales.  Abdominal: Soft. She exhibits no distension. There is no tenderness. There is no rebound.  Skin: Skin is warm and dry. Rash noted.  Macular red rash on the right shoulder, not vesicles, not itching or sore and on the thighs bilaterally  Psychiatric: She has a normal mood and affect.   Filed Vitals:   03/29/15 1555    BP: 138/78  Pulse: 85  Temp: 97.9 F (36.6 C)  TempSrc: Oral  Resp: 12  Height: 5' 3.5" (1.613 m)  Weight: 147 lb (66.679 kg)  SpO2: 96%      Assessment & Plan:

## 2015-03-29 NOTE — Patient Instructions (Signed)
We will put in for the lab results today so you can come before your next visit.   Use the cream on the rash and if it does not help call us back and we can try something different.   Think about trying knitting or crocheting to help with the stress and to give you something to do with your hands instead of smoking. Most areas have knitting groups that would give you some other people who could teach you and to knit with you.   Stress and Stress Management Stress is a normal reaction to life events. It is what you feel when life demands more than you are used to or more than you can handle. Some stress can be useful. For example, the stress reaction can help you catch the last bus of the day, study for a test, or meet a deadline at work. But stress that occurs too often or for too long can cause problems. It can affect your emotional health and interfere with relationships and normal daily activities. Too much stress can weaken your immune system and increase your risk for physical illness. If you already have a medical problem, stress can make it worse. CAUSES  All sorts of life events may cause stress. An event that causes stress for one person may not be stressful for another person. Major life events commonly cause stress. These may be positive or negative. Examples include losing your job, moving into a new home, getting married, having a baby, or losing a loved one. Less obvious life events may also cause stress, especially if they occur day after day or in combination. Examples include working long hours, driving in traffic, caring for children, being in debt, or being in a difficult relationship. SIGNS AND SYMPTOMS Stress may cause emotional symptoms including, the following:  Anxiety. This is feeling worried, afraid, on edge, overwhelmed, or out of control.  Anger. This is feeling irritated or impatient.  Depression. This is feeling sad, down, helpless, or guilty.  Difficulty focusing,  remembering, or making decisions. Stress may cause physical symptoms, including the following:   Aches and pains. These may affect your head, neck, back, stomach, or other areas of your body.  Tight muscles or clenched jaw.  Low energy or trouble sleeping. Stress may cause unhealthy behaviors, including the following:   Eating to feel better (overeating) or skipping meals.  Sleeping too little, too much, or both.  Working too much or putting off tasks (procrastination).  Smoking, drinking alcohol, or using drugs to feel better. DIAGNOSIS  Stress is diagnosed through an assessment by your health care provider. Your health care provider will ask questions about your symptoms and any stressful life events.Your health care provider will also ask about your medical history and may order blood tests or other tests. Certain medical conditions and medicine can cause physical symptoms similar to stress. Mental illness can cause emotional symptoms and unhealthy behaviors similar to stress. Your health care provider may refer you to a mental health professional for further evaluation.  TREATMENT  Stress management is the recommended treatment for stress.The goals of stress management are reducing stressful life events and coping with stress in healthy ways.  Techniques for reducing stressful life events include the following:  Stress identification. Self-monitor for stress and identify what causes stress for you. These skills may help you to avoid some stressful events.  Time management. Set your priorities, keep a calendar of events, and learn to say "no." These tools can help  you avoid making too many commitments. Techniques for coping with stress include the following:  Rethinking the problem. Try to think realistically about stressful events rather than ignoring them or overreacting. Try to find the positives in a stressful situation rather than focusing on the negatives.  Exercise.  Physical exercise can release both physical and emotional tension. The key is to find a form of exercise you enjoy and do it regularly.  Relaxation techniques. These relax the body and mind. Examples include yoga, meditation, tai chi, biofeedback, deep breathing, progressive muscle relaxation, listening to music, being out in nature, journaling, and other hobbies. Again, the key is to find one or more that you enjoy and can do regularly.  Healthy lifestyle. Eat a balanced diet, get plenty of sleep, and do not smoke. Avoid using alcohol or drugs to relax.  Strong support network. Spend time with family, friends, or other people you enjoy being around.Express your feelings and talk things over with someone you trust. Counseling or talktherapy with a mental health professional may be helpful if you are having difficulty managing stress on your own. Medicine is typically not recommended for the treatment of stress.Talk to your health care provider if you think you need medicine for symptoms of stress. HOME CARE INSTRUCTIONS  Keep all follow-up visits as directed by your health care provider.  Take all medicines as directed by your health care provider. SEEK MEDICAL CARE IF:  Your symptoms get worse or you start having new symptoms.  You feel overwhelmed by your problems and can no longer manage them on your own. SEEK IMMEDIATE MEDICAL CARE IF:  You feel like hurting yourself or someone else.   This information is not intended to replace advice given to you by your health care provider. Make sure you discuss any questions you have with your health care provider.   Document Released: 08/26/2000 Document Revised: 03/23/2014 Document Reviewed: 10/25/2012 Elsevier Interactive Patient Education [employer].

## 2015-03-29 NOTE — Assessment & Plan Note (Signed)
On venlafaxime for her mood and anxiety and she is struggling with it. Advised her to work on finding an activity to help with stress and taking more time to develop her own passions and stress reducers. She was also advised to consider more counseling but she does not feel that would help.

## 2015-03-29 NOTE — Progress Notes (Signed)
Pre visit review using our clinic review tool, if applicable. No additional management support is needed unless otherwise documented below in the visit note. 

## 2015-06-21 ENCOUNTER — Other Ambulatory Visit: Payer: Self-pay | Admitting: Internal Medicine

## 2015-07-05 ENCOUNTER — Ambulatory Visit: Payer: Self-pay | Admitting: Family

## 2015-07-05 DIAGNOSIS — M7661 Achilles tendinitis, right leg: Secondary | ICD-10-CM | POA: Diagnosis not present

## 2015-07-10 DIAGNOSIS — M25571 Pain in right ankle and joints of right foot: Secondary | ICD-10-CM | POA: Diagnosis not present

## 2015-07-17 DIAGNOSIS — M25571 Pain in right ankle and joints of right foot: Secondary | ICD-10-CM | POA: Diagnosis not present

## 2015-07-17 DIAGNOSIS — R262 Difficulty in walking, not elsewhere classified: Secondary | ICD-10-CM | POA: Diagnosis not present

## 2015-07-17 DIAGNOSIS — M7661 Achilles tendinitis, right leg: Secondary | ICD-10-CM | POA: Diagnosis not present

## 2015-08-15 ENCOUNTER — Other Ambulatory Visit: Payer: Self-pay | Admitting: Internal Medicine

## 2015-09-02 ENCOUNTER — Encounter: Payer: Self-pay | Admitting: Internal Medicine

## 2015-09-02 ENCOUNTER — Ambulatory Visit (INDEPENDENT_AMBULATORY_CARE_PROVIDER_SITE_OTHER): Payer: Medicare Other | Admitting: Internal Medicine

## 2015-09-02 ENCOUNTER — Other Ambulatory Visit (INDEPENDENT_AMBULATORY_CARE_PROVIDER_SITE_OTHER): Payer: Medicare Other

## 2015-09-02 VITALS — BP 122/78 | HR 79 | Temp 98.4°F | Resp 14 | Ht 63.5 in | Wt 146.0 lb

## 2015-09-02 DIAGNOSIS — G90511 Complex regional pain syndrome I of right upper limb: Secondary | ICD-10-CM | POA: Diagnosis not present

## 2015-09-02 DIAGNOSIS — G43409 Hemiplegic migraine, not intractable, without status migrainosus: Secondary | ICD-10-CM

## 2015-09-02 DIAGNOSIS — G90519 Complex regional pain syndrome I of unspecified upper limb: Secondary | ICD-10-CM | POA: Insufficient documentation

## 2015-09-02 DIAGNOSIS — F4323 Adjustment disorder with mixed anxiety and depressed mood: Secondary | ICD-10-CM

## 2015-09-02 DIAGNOSIS — Z0000 Encounter for general adult medical examination without abnormal findings: Secondary | ICD-10-CM

## 2015-09-02 LAB — CBC
HEMATOCRIT: 42 % (ref 36.0–46.0)
HEMOGLOBIN: 14.2 g/dL (ref 12.0–15.0)
MCHC: 33.7 g/dL (ref 30.0–36.0)
MCV: 88.2 fl (ref 78.0–100.0)
PLATELETS: 326 10*3/uL (ref 150.0–400.0)
RBC: 4.76 Mil/uL (ref 3.87–5.11)
RDW: 13.6 % (ref 11.5–15.5)
WBC: 9.1 10*3/uL (ref 4.0–10.5)

## 2015-09-02 LAB — LIPID PANEL
CHOL/HDL RATIO: 6
Cholesterol: 258 mg/dL — ABNORMAL HIGH (ref 0–200)
HDL: 41 mg/dL (ref >39.00)
NONHDL: 216.71
Triglycerides: 266 mg/dL — ABNORMAL HIGH (ref 0.0–149.0)
VLDL: 53.2 mg/dL — ABNORMAL HIGH (ref 0.0–40.0)

## 2015-09-02 LAB — COMPREHENSIVE METABOLIC PANEL
ALT: 15 U/L (ref 0–35)
AST: 19 U/L (ref 0–37)
Albumin: 4.1 g/dL (ref 3.5–5.2)
Alkaline Phosphatase: 96 U/L (ref 39–117)
BUN: 16 mg/dL (ref 6–23)
CALCIUM: 9.2 mg/dL (ref 8.4–10.5)
CHLORIDE: 108 meq/L (ref 96–112)
CO2: 23 meq/L (ref 19–32)
Creatinine, Ser: 0.54 mg/dL (ref 0.40–1.20)
GFR: 119.03 mL/min (ref >60.00)
Glucose, Bld: 105 mg/dL — ABNORMAL HIGH (ref 70–99)
Potassium: 4.1 mEq/L (ref 3.5–5.1)
Sodium: 141 mEq/L (ref 135–145)
Total Bilirubin: 0.5 mg/dL (ref 0.2–1.2)
Total Protein: 7.4 g/dL (ref 6.0–8.3)

## 2015-09-02 LAB — LDL CHOLESTEROL, DIRECT: LDL DIRECT: 159 mg/dL

## 2015-09-02 LAB — TSH: TSH: 2.07 u[IU]/mL (ref 0.35–4.50)

## 2015-09-02 MED ORDER — DICLOFENAC SODIUM 1 % TD GEL: 2.0000 g | Freq: Four times a day (QID) | TRANSDERMAL | Status: DC

## 2015-09-02 MED ORDER — LORAZEPAM 0.5 MG PO TABS: 0.5000 mg | ORAL_TABLET | Freq: Every day | ORAL | Status: DC | PRN

## 2015-09-02 NOTE — Assessment & Plan Note (Signed)
Is doing well on the effexor but having side effect of hot flashes. She would like to tolerate the hot flashes as the migraines were very severe.

## 2015-09-02 NOTE — Assessment & Plan Note (Signed)
Referral to hand surgery as she likely needs another nerve block and possible therapy. No injury or overuse to cause the symptoms. Also rx for voltaren gel to help with pain and inflammation.

## 2015-09-02 NOTE — Assessment & Plan Note (Signed)
She is taking effexor and is doing okay overall. Getting back into some of her pleasure activities like gardening. She is eating and smoking more than she should. She is talking to her catholic counselor again which she finds marginally helpful. Rx for ativan for flying only.

## 2015-09-02 NOTE — Patient Instructions (Signed)
We have sent in voltaren gel which you can use on the elbow and the heel to see if it helps. You are able to use it up to 3 times per day.   We will get you in with the hand specialist to check on the right arm.  We will keep the effexor medicine the same today but if the hot flashes get worse we could consider decreasing the dose some.   You will get through this so keep up the good attitude.   Consider taking up a hobby that uses your hands to keep them mobile and distract from the eating and smoking.

## 2015-09-02 NOTE — Progress Notes (Signed)
   Subjective:    Patient ID: Lacey Salas, female    DOB: 04/05/1945, 70 y.o.   MRN: [id number]  HPI The patient was coming in for wellness however is having some acute serious concerns and asks to defer wellness today to address her concerns. She is having pain in her right elbow and arm, she previously had fracture in that wrist 2004ish. After that had total loss of function with RSD and treated with therapy and multiple nerve blocks. She is now having some of those symptoms again with loss of function and flexibility of the arm and hand. She is able to stretch it out some but she is very concerned about progression as in the past. Denies injury to the hand and elbow, denies new or excessive activity.  Next concern is her mood. She is taking effexor which is helping some. Initially was started for migraines and has alleviated her migraines. She is struggling still with some personal problems which are overwhelming to her at times. She does have claustrophobia with flying and is going to visit her son soon and had taken a small dose of ativan in the past with success prior to flying. Would like to know if she can have that back.   Review of Systems  Constitutional: Negative for fever, activity change, appetite change, fatigue and unexpected weight change.  Eyes: Negative.   Respiratory: Negative for cough, chest tightness, shortness of breath and wheezing.   Cardiovascular: Negative for chest pain, palpitations and leg swelling.  Gastrointestinal: Negative for nausea, abdominal pain, diarrhea, constipation and abdominal distention.  Endocrine: Positive for heat intolerance.  Musculoskeletal: Positive for myalgias and arthralgias. Negative for joint swelling.  Skin: Negative for pallor, rash and wound.  Psychiatric/Behavioral: Positive for dysphoric mood and decreased concentration.      Objective:   Physical Exam  Constitutional: She is oriented to person, place, and time. She appears  well-developed and well-nourished.  HENT:  Head: Normocephalic and atraumatic.  Eyes: EOM are normal.  Neck: Normal range of motion.  Cardiovascular: Normal rate and regular rhythm.   Pulmonary/Chest: Effort normal and breath sounds normal. No respiratory distress. She has no wheezes. She has no rales.  Abdominal: Soft. She exhibits no distension. There is no tenderness. There is no rebound.  Musculoskeletal: She exhibits tenderness.  Some tenderness along the medial elbow region with some radiation into the wrist. No overt swelling in the elbow or wrist. Decreased strength in her right wrist with gripping and with bending.   Neurological: She is alert and oriented to person, place, and time.  Skin: Skin is warm and dry.  Psychiatric: She has a normal mood and affect.   Filed Vitals:   09/02/15 1026  BP: 122/78  Pulse: 79  Temp: 98.4 F (36.9 C)  TempSrc: Oral  Resp: 14  Height: 5' 3.5" (1.613 m)  Weight: 146 lb (66.225 kg)  SpO2: 97%      Assessment & Plan:

## 2015-09-02 NOTE — Progress Notes (Signed)
Pre visit review using our clinic review tool, if applicable. No additional management support is needed unless otherwise documented below in the visit note. 

## 2015-09-10 ENCOUNTER — Telehealth: Payer: Self-pay | Admitting: Internal Medicine

## 2015-09-10 DIAGNOSIS — M79673 Pain in unspecified foot: Secondary | ICD-10-CM

## 2015-09-10 NOTE — Telephone Encounter (Signed)
Patient called to follow up on call from Amy on 09/05/2015. Advised of lab results. She states that she told dr Okey Dupre about her heel; and arm. There is a referral entered for the arm, but not for the heel. Is this something that we were waiting on? Please call the patient an advise.

## 2015-09-10 NOTE — Telephone Encounter (Signed)
Placed referral  

## 2015-09-10 NOTE — Telephone Encounter (Signed)
Patient is aware that the referral has been placed for the arm. She has 3 bone spurs. She went to therapy and she is still having trouble. Would you be willing to send patient to ortho? Please advise, thanks.

## 2015-09-11 NOTE — Telephone Encounter (Signed)
Patient aware.

## 2015-09-12 DIAGNOSIS — M7711 Lateral epicondylitis, right elbow: Secondary | ICD-10-CM | POA: Diagnosis not present

## 2015-09-12 DIAGNOSIS — M9261 Juvenile osteochondrosis of tarsus, right ankle: Secondary | ICD-10-CM | POA: Diagnosis not present

## 2015-09-21 DIAGNOSIS — M25571 Pain in right ankle and joints of right foot: Secondary | ICD-10-CM | POA: Diagnosis not present

## 2015-09-23 ENCOUNTER — Other Ambulatory Visit: Payer: Self-pay | Admitting: Internal Medicine

## 2015-09-24 DIAGNOSIS — M25571 Pain in right ankle and joints of right foot: Secondary | ICD-10-CM | POA: Diagnosis not present

## 2015-10-07 ENCOUNTER — Telehealth: Payer: Self-pay | Admitting: Internal Medicine

## 2015-10-07 ENCOUNTER — Ambulatory Visit (INDEPENDENT_AMBULATORY_CARE_PROVIDER_SITE_OTHER): Payer: Medicare Other | Admitting: Family Medicine

## 2015-10-07 ENCOUNTER — Encounter: Payer: Self-pay | Admitting: Family Medicine

## 2015-10-07 VITALS — BP 118/72 | HR 90 | Temp 97.7°F | Ht 63.5 in | Wt 149.1 lb

## 2015-10-07 DIAGNOSIS — L500 Allergic urticaria: Secondary | ICD-10-CM | POA: Diagnosis not present

## 2015-10-07 MED ORDER — PREDNISONE 10 MG PO TABS: ORAL_TABLET | ORAL | 0 refills | Status: DC

## 2015-10-07 NOTE — Progress Notes (Signed)
Pre visit review using our clinic review tool, if applicable. No additional management support is needed unless otherwise documented below in the visit note. 

## 2015-10-07 NOTE — Progress Notes (Addendum)
Subjective:    Patient ID: Lacey Salas, female    DOB: February 04, 1946, 70 y.o.   MRN: [id number]  HPI  Lacey Salas is a 70 year old female who presents today for a rash that started last night. This rash started on her right arm which has progressed to her abdomen and legs.  Associated symptom of pruritis is noted.  She reports a history of "chigger" bites and history an allergic reaction to red ants in October 2016.  She reports using a new hair dye last night between 8:30pm and 9:00pm that she used in the shower.  She noticed raised areas that were pruritic occurring approximately 1 to 2 hours after using this hair dye. She also reports using chemicals in her garden which she was exposed to on the same day as the new hair dye. She denies fever, chills, sweats, swelling of lips/tongue, tightness in her chest, trouble breathing, or abdominal pain. She denies any new medications, new fabric softeners/detergents/lotions. Treatment at home includes use of witch hazel and diphenhydramine cream which has provided moderate benefit.    Review of Systems  Constitutional: Negative for chills, fatigue and fever.  Eyes: Negative for visual disturbance.  Respiratory: Negative for cough, shortness of breath, wheezing and stridor.   Cardiovascular: Negative for chest pain and palpitations.  Gastrointestinal: Negative for abdominal pain, constipation, nausea and vomiting.  Musculoskeletal: Negative for myalgias.  Skin: Positive for rash.  Allergic/Immunologic: Negative for environmental allergies and food allergies.  Neurological: Negative for dizziness, light-headedness, numbness and headaches.   Past Medical History:  Diagnosis Date  . Anxiety   . Depression   . GERD (gastroesophageal reflux disease)   . History of chicken pox   . Hyperlipidemia   . Migraine    complicated migraines with transient R side facial paralysis and aphasia  . MVP (mitral valve prolapse)   . Osteopenia   . Reflex sympathetic  dystrophy, unspecified 2011   R hand related to wrist fx, improved s/p nerve blocks  . Seizures (HCC)   . Stevens-Johnson disease [facility])      Social History   Social History  . Marital status: Married    Spouse name: N/A  . Number of children: N/A  . Years of education: N/A   Occupational History  . Not on file.   Social History Main Topics  . Smoking status: Current Every Day Smoker    Types: Cigarettes  . Smokeless tobacco: Never Used     Comment: Quit over 30 years ago( just needs it a bit now ) Stress   . Alcohol use No  . Drug use: No  . Sexual activity: No   Other Topics Concern  . Not on file   Social History Narrative  . No narrative on file    Past Surgical History:  Procedure Laterality Date  . ABDOMINAL HYSTERECTOMY     BSO  . HEEL SPUR SURGERY  12/1996   right  . ORIF DISTAL RADIUS FRACTURE  2004    Family History  Problem Relation Age of Onset  . Leukemia Brother     CLL  . Arthritis Mother   . Arthritis Father   . Breast cancer Sister   . Arthritis Other   . Ovarian cancer Other   . Hyperlipidemia Other   . Hypertension Other   . Stroke Other     Allergies  Allergen Reactions  . Cephalexin     REACTION: Levonne Spiller  . Epinephrine  REACTION: hypertensive reaction  . Erythromycin     REACTION: Rash  . Lactated Ringers     REACTION: cardiac symptoms  . Nortriptyline     Rapid heart rate  . Penicillins     REACTION: Rash  . Sulfamethoxazole-Trimethoprim     REACTION: Levonne Spiller  . Sulfonamide Derivatives   . Trimethoprim     REACTION: stevens johnson syndrome  . Wellbutrin [Bupropion] Other (See Comments)    tremors    Current Outpatient Prescriptions on File Prior to Visit  Medication Sig Dispense Refill  . atenolol (TENORMIN) 25 MG tablet TAKE 1 TABLET (25 MG TOTAL) BY MOUTH DAILY. 90 tablet 3  . esomeprazole (NEXIUM) 20 MG capsule Take 1 capsule (20 mg total) by mouth daily before breakfast. OVC    . LORazepam  (ATIVAN) 0.5 MG tablet Take 1 tablet (0.5 mg total) by mouth daily as needed (flying). 5 tablet 0  . venlafaxine XR (EFFEXOR-XR) 75 MG 24 hr capsule TAKE ONE CAPSULE BY MOUTH EVERY DAY WITH BREAKFAST 90 capsule 0  . diclofenac sodium (VOLTAREN) 1 % GEL Apply 2 g topically 4 (four) times daily. (Patient not taking: Reported on 10/07/2015) 100 g 6   No current facility-administered medications on file prior to visit.     BP 118/72 (BP Location: Left Arm, Patient Position: Sitting, Cuff Size: Normal)   Pulse 90   Temp 97.7 F (36.5 C) (Oral)   Ht 5' 3.5" (1.613 m)   Wt 149 lb 1.6 oz (67.6 kg)   SpO2 96%   BMI 26.00 kg/m       Objective:   Physical Exam  Constitutional: She is oriented to person, place, and time. She appears well-developed and well-nourished.  Eyes: Pupils are equal, round, and reactive to light. No scleral icterus.  Neck: Neck supple.  Cardiovascular: Normal rate and regular rhythm.   Pulmonary/Chest: Effort normal and breath sounds normal. She has no wheezes. She has no rales.  Abdominal: Soft. Bowel sounds are normal. There is no tenderness.  Musculoskeletal: She exhibits no edema.  Lymphadenopathy:    She has no cervical adenopathy.  Neurological: She is alert and oriented to person, place, and time.  Skin: Skin is warm and dry. Rash noted.  Raised, erythematous  plaques with central clearing and pruritis are present on right upper extremity, right side of lower back, and behind right and left knee bilaterally. Areas are not greater than one to 1 1/2 centimeters in diameter.  Psychiatric: She has a normal mood and affect. Her behavior is normal. Judgment and thought content normal.        Assessment & Plan:  1. Allergic urticaria Exam and history support treatment for urticaria with antihistamines and prednisone.  Discussed with patient to avoid any triggers such as  hair dye rinse that she used, chemical treatments that she has used in her garden, and use dye  free/scent free products. Advised her to follow up if symptoms do not improve in 2 days, worsen, or she develops new symptoms. Further advised patient regarding symptoms that require immediate medical attention such as fever, swollen lips/tongue, trouble breathing or swallowing, or tightness in her chest. We also discussed a possible referral for allergy evaluation as so many potential triggers have been identified in her history.She voiced understanding and agreed with plan.  Advised use of Allegra, Claritin, or Zyrtec daily for symptom relief.   - predniSONE (DELTASONE) 10 MG tablet; Take 4 tablets  once daily for 2 days, 3 tablets  once daily for 2 days, 2 tablets once daily for 2 days, and one tablet daily for 2 days.  Dispense: 20 tablet; Refill: 0  Roddie Mc, FNP-C

## 2015-10-07 NOTE — Telephone Encounter (Signed)
Fine with me

## 2015-10-07 NOTE — Patient Instructions (Signed)
Please take either Allegra, Claritin, or Zyrtec as directed once daily with prednisone that has been prescribed for you.   If symptoms do not improve in 2 days, worsen, or you develop new symptoms please follow up for further evaluation and treatment.  If you notice that symptoms are worse and are having trouble with shortness of breath, please seek medical attention immediately.   Hives Hives are itchy, red, swollen areas of the skin. They can vary in size and location on your body. Hives can come and go for hours or several days (acute hives) or for several weeks (chronic hives). Hives do not spread from person to person (noncontagious). They may get worse with scratching, exercise, and emotional stress. CAUSES   Allergic reaction to food, additives, or drugs.  Infections, including the common cold.  Illness, such as vasculitis, lupus, or thyroid disease.  Exposure to sunlight, heat, or cold.  Exercise.  Stress.  Contact with chemicals. SYMPTOMS   Red or white swollen patches on the skin. The patches may change size, shape, and location quickly and repeatedly.  Itching.  Swelling of the hands, feet, and face. This may occur if hives develop deeper in the skin. DIAGNOSIS  Your caregiver can usually tell what is wrong by performing a physical exam. Skin or blood tests may also be done to determine the cause of your hives. In some cases, the cause cannot be determined. TREATMENT  Mild cases usually get better with medicines such as antihistamines. Severe cases may require an emergency epinephrine injection. If the cause of your hives is known, treatment includes avoiding that trigger.  HOME CARE INSTRUCTIONS   Avoid causes that trigger your hives.  Take antihistamines as directed by your caregiver to reduce the severity of your hives. Non-sedating or low-sedating antihistamines are usually recommended. Do not drive while taking an antihistamine.  Take any other medicines  prescribed for itching as directed by your caregiver.  Wear loose-fitting clothing.  Keep all follow-up appointments as directed by your caregiver. SEEK MEDICAL CARE IF:   You have persistent or severe itching that is not relieved with medicine.  You have painful or swollen joints. SEEK IMMEDIATE MEDICAL CARE IF:   You have a fever.  Your tongue or lips are swollen.  You have trouble breathing or swallowing.  You feel tightness in the throat or chest.  You have abdominal pain. These problems may be the first sign of a life-threatening allergic reaction. Call your local emergency services (911 in [place] MAKE SURE YOU:   Understand these instructions.  Will watch your condition.  Will get help right away if you are not doing well or get worse.   This information is not intended to replace advice given to you by your health care provider. Make sure you discuss any questions you have with your health care provider.   Document Released: 03/02/2005 Document Revised: 03/07/2013 Document Reviewed: 05/26/2011 Elsevier Interactive Patient Education [employer]

## 2015-10-07 NOTE — Telephone Encounter (Signed)
The patient would like to transfer to [employer] from [employer]. She doesn't have anything against Hillard Danker, she's a great Doctor but she feels that she has a better connection with Roddie Mc.

## 2015-10-15 DIAGNOSIS — R262 Difficulty in walking, not elsewhere classified: Secondary | ICD-10-CM | POA: Diagnosis not present

## 2015-10-15 DIAGNOSIS — M7661 Achilles tendinitis, right leg: Secondary | ICD-10-CM | POA: Diagnosis not present

## 2015-10-15 DIAGNOSIS — M25571 Pain in right ankle and joints of right foot: Secondary | ICD-10-CM | POA: Diagnosis not present

## 2015-10-15 DIAGNOSIS — M25671 Stiffness of right ankle, not elsewhere classified: Secondary | ICD-10-CM | POA: Diagnosis not present

## 2015-10-25 DIAGNOSIS — M25671 Stiffness of right ankle, not elsewhere classified: Secondary | ICD-10-CM | POA: Diagnosis not present

## 2015-10-25 DIAGNOSIS — M7661 Achilles tendinitis, right leg: Secondary | ICD-10-CM | POA: Diagnosis not present

## 2015-10-25 DIAGNOSIS — R262 Difficulty in walking, not elsewhere classified: Secondary | ICD-10-CM | POA: Diagnosis not present

## 2015-10-25 DIAGNOSIS — M25571 Pain in right ankle and joints of right foot: Secondary | ICD-10-CM | POA: Diagnosis not present

## 2015-11-05 DIAGNOSIS — M25571 Pain in right ankle and joints of right foot: Secondary | ICD-10-CM | POA: Diagnosis not present

## 2015-12-23 ENCOUNTER — Other Ambulatory Visit: Payer: Self-pay | Admitting: Internal Medicine

## 2016-03-27 ENCOUNTER — Telehealth: Payer: Self-pay | Admitting: Internal Medicine

## 2016-03-27 NOTE — Telephone Encounter (Signed)
She doesn't take any seizure medication, which medications are we talking about?

## 2016-03-27 NOTE — Telephone Encounter (Signed)
Pt called in and said that she is going to Dr on the 16th but she is out of her seizer meds.  She has a script but she does not want to go fill it for 90 pills if Dr Everlena Cooper is going to change the dosage.  She I has not been doing well on the dosage that she currently has.  She wants to know if Dr Okey Dupre can call in 4 pills just to make it til the 16th?

## 2016-03-31 ENCOUNTER — Encounter: Payer: Self-pay | Admitting: Neurology

## 2016-03-31 ENCOUNTER — Ambulatory Visit (INDEPENDENT_AMBULATORY_CARE_PROVIDER_SITE_OTHER): Payer: Medicare Other | Admitting: Neurology

## 2016-03-31 VITALS — BP 124/74 | HR 88 | Ht 63.5 in | Wt 154.7 lb

## 2016-03-31 DIAGNOSIS — F17200 Nicotine dependence, unspecified, uncomplicated: Secondary | ICD-10-CM | POA: Diagnosis not present

## 2016-03-31 DIAGNOSIS — G43109 Migraine with aura, not intractable, without status migrainosus: Secondary | ICD-10-CM | POA: Diagnosis not present

## 2016-03-31 DIAGNOSIS — G250 Essential tremor: Secondary | ICD-10-CM

## 2016-03-31 DIAGNOSIS — G43409 Hemiplegic migraine, not intractable, without status migrainosus: Secondary | ICD-10-CM | POA: Diagnosis not present

## 2016-03-31 MED ORDER — VENLAFAXINE HCL ER 150 MG PO CP24: 150.0000 mg | ORAL_CAPSULE | Freq: Every day | ORAL | 2 refills | Status: DC

## 2016-03-31 NOTE — Progress Notes (Signed)
NEUROLOGY FOLLOW UP OFFICE NOTE  Lacey Salas [id number]  HISTORY OF PRESENT ILLNESS: Lacey Salas is a 71 year old right-handed woman with history of anxiety, depression, migraine, complex regional pain syndrome of right hand, hyperlipidemia, osteopenia, MVP and Stevens-Johnson syndrome with complex migraines who follows up today for vision changes.   UPDATE: Since starting venlafaxine XR 75mg  daily, she has had only 1 (maybe 2) hemiplegic migraines.  Over the past 1 to 2 months, she has had increased frequency of ocular migraines (which she had when she was younger).  She reports zigzag lines and mirage effect in her visual field, lasting from 5 to 15 minutes.  She reports no associated headache, but she takes a Tylenol right away.  It occurs anywhere from 2 times a day to every 3 or 4 days.  She has had about 10 to 15 days over the past month.  She reports some increased stress and anxiety. Current Antihypertensive medications:  atenolol 25mg  Current Antidepressant medications:  venlafaxine XR 75mg  Current Anticonvulsant medications:  no  She reports tremor in the hands, worse on the right but that is her dominant hand.  It shakes when she is holding paper but it is not severe.  It is unchanged over the past few years.    She also feels more lightheaded.  She felt like she was going to pass out in church.   HISTORY: She has been evaluated by several neurologists and headache specialists, such as Dr. , Dr. , Dr. , Dr. Avie Echevaria, and Dr. Fonnie Jarvis. Occurs spontaneously, including while driving. Onset:  Has history of typical migraines (with headache, nausea and vomiting) since age 37.  This subsided and started to have these current spells starting in her early 8s.  70% of the time it is accompanied by headache. Location:  holocephalic Quality:  Non-throbbing pressure Initial intensity:  5/10 Aura:  no Prodrome:  Indescribable "foreboding"  feeling Associated symptoms:  Migraines typically present as TIA-like symptoms, including right-sided weakness involving face, arm and leg, photophobia, word-finding difficulties, difficulty getting words out, slurred speech, staggering gait.  She is conscious and alert but doesn't feel like she can function during the spell.  Following episodes, she is fatigued and will sleep for 2-3 hours.  On rare occasions, accompanied by field cut. Initial duration:  30 minutes Initial frequency:  Varies.  Usually once a month but may be 3 times a week. Triggers/exacerbating factors:  Chemical smells, some perfumes, bright lights Relieving factors:  Laying down Activity:  Cannot function   Past abortive therapy:  none Past preventative therapy:  Amitriptyline (initially for complex regional pain syndrome), Sinequan, onriboflavin   Caffeine:  no Alcohol:  no Smoker:  Quit 34 years ago but started again due to recent tragedy Depression:  A very difficult year.  Both her parents passed away.  Her husband left her because he couldn't handle the stress related to the loss of his wife's parents. Family history of headache:  Maternal grandmother, sister  PAST MEDICAL HISTORY: Past Medical History:  Diagnosis Date  . Anxiety   . Depression   . GERD (gastroesophageal reflux disease)   . History of chicken pox   . Hyperlipidemia   . Migraine    complicated migraines with transient R side facial paralysis and aphasia  . MVP (mitral valve prolapse)   . Osteopenia   . Reflex sympathetic dystrophy, unspecified 2011   R hand related to wrist fx, improved s/p nerve blocks  .  Seizures (HCC)   . Stevens-Johnson disease [facility])     MEDICATIONS: Current Outpatient Prescriptions on File Prior to Visit  Medication Sig Dispense Refill  . atenolol (TENORMIN) 25 MG tablet TAKE 1 TABLET (25 MG TOTAL) BY MOUTH DAILY. 90 tablet 3  . esomeprazole (NEXIUM) 20 MG capsule Take 1 capsule (20 mg total) by mouth daily before  breakfast. OVC    . LORazepam (ATIVAN) 0.5 MG tablet Take 1 tablet (0.5 mg total) by mouth daily as needed (flying). (Patient not taking: Reported on 03/31/2016) 5 tablet 0   No current facility-administered medications on file prior to visit.     ALLERGIES: Allergies  Allergen Reactions  . Cephalexin     REACTION: Levonne Spiller  . Epinephrine     REACTION: hypertensive reaction  . Erythromycin     REACTION: Rash  . Lactated Ringers     REACTION: cardiac symptoms  . Nortriptyline     Rapid heart rate  . Penicillins     REACTION: Rash  . Sulfamethoxazole-Trimethoprim     REACTION: Levonne Spiller  . Sulfonamide Derivatives   . Trimethoprim     REACTION: stevens johnson syndrome  . Wellbutrin [Bupropion] Other (See Comments)    tremors    FAMILY HISTORY: Family History  Problem Relation Age of Onset  . Leukemia Brother     CLL  . Arthritis Mother   . Arthritis Father   . Breast cancer Sister   . Arthritis Other   . Ovarian cancer Other   . Hyperlipidemia Other   . Hypertension Other   . Stroke Other     SOCIAL HISTORY: Social History   Social History  . Marital status: Married    Spouse name: N/A  . Number of children: N/A  . Years of education: N/A   Occupational History  . Not on file.   Social History Main Topics  . Smoking status: Current Every Day Smoker    Types: Cigarettes  . Smokeless tobacco: Never Used     Comment: Quit over 30 years ago( just needs it a bit now ) Stress   . Alcohol use No  . Drug use: No  . Sexual activity: No   Other Topics Concern  . Not on file   Social History Narrative  . No narrative on file    REVIEW OF SYSTEMS: Constitutional: No fevers, chills, or sweats, no generalized fatigue, change in appetite Eyes: No visual changes, double vision, eye pain Ear, nose and throat: No hearing loss, ear pain, nasal congestion, sore throat Cardiovascular: No chest pain, palpitations Respiratory:  No shortness of breath  at rest or with exertion, wheezes GastrointestinaI: No nausea, vomiting, diarrhea, abdominal pain, fecal incontinence Genitourinary:  No dysuria, urinary retention or frequency Musculoskeletal:  No neck pain, back pain Integumentary: No rash, pruritus, skin lesions Neurological: as above Psychiatric: No depression, insomnia, anxiety Endocrine: No palpitations, fatigue, diaphoresis, mood swings, change in appetite, change in weight, increased thirst Hematologic/Lymphatic:  No purpura, petechiae. Allergic/Immunologic: no itchy/runny eyes, nasal congestion, recent allergic reactions, rashes  PHYSICAL EXAM: Vitals:   03/31/16 1437  BP: 124/74  Pulse: 88   General: No acute distress.  Patient appears well-groomed.  normal body habitus. Head:  Normocephalic/atraumatic Eyes:  Fundi examined but not visualized Neck: supple, no paraspinal tenderness, full range of motion Heart:  Regular rate and rhythm Lungs:  Clear to auscultation bilaterally Back: No paraspinal tenderness Neurological Exam: alert and oriented to person, place, and time. Attention span and concentration  intact, recent and remote memory intact, fund of knowledge intact.  Speech fluent and not dysarthric, language intact.  CN II-XII intact. Bulk and tone normal, muscle strength 5/5 throughout.  Sensation to light touch  intact.  Deep tendon reflexes 2+ throughout.  Finger to nose testing intact.  Gait normal  IMPRESSION: Ocular migraines, increased frequency Hemiplegic migraine, stable Essential tremor, stable. Tobacco use  PLAN: 1.  Increase venlafaxine XR to 150mg  daily.  Contact me in 6 weeks. 2.  Monitor tremor.  Not severe at this time. 3.  Discuss lightheadedness with PCP. 4.  Discussed smoking cessation. 5.  Follow up in 3 months.  , DO  CC:  Shon Millet, MD

## 2016-03-31 NOTE — Patient Instructions (Signed)
We will increase venlafaxine XR to 150mg  daily Contact me in 6 weeks with update and we can increase dose further if needed Follow up in 3 months.

## 2016-04-22 DIAGNOSIS — M9261 Juvenile osteochondrosis of tarsus, right ankle: Secondary | ICD-10-CM | POA: Diagnosis not present

## 2016-04-22 DIAGNOSIS — M6701 Short Achilles tendon (acquired), right ankle: Secondary | ICD-10-CM | POA: Diagnosis not present

## 2016-04-22 DIAGNOSIS — M7661 Achilles tendinitis, right leg: Secondary | ICD-10-CM | POA: Diagnosis not present

## 2016-04-30 DIAGNOSIS — H5713 Ocular pain, bilateral: Secondary | ICD-10-CM | POA: Diagnosis not present

## 2016-06-28 ENCOUNTER — Other Ambulatory Visit: Payer: Self-pay | Admitting: Neurology

## 2016-06-29 NOTE — Telephone Encounter (Signed)
Patient was to call in 6 weeks with an update. Need to talk to patient to see how she is doing prior to approval.

## 2016-06-30 NOTE — Telephone Encounter (Signed)
Called pt. No answer. Will try later.  

## 2016-07-02 ENCOUNTER — Telehealth: Payer: Self-pay | Admitting: Neurology

## 2016-07-02 NOTE — Telephone Encounter (Signed)
Pt calling with Effexor update. Patient says she is still having tremor episodes.  Since taking med feels like mind is racing and that she is very talkative. She says these things are not terribly awful but she has noticed since taking med. She has to go to [place] next Wednesday to visit son, and wanted to know if she should decrease med or continue taking same dose.

## 2016-07-02 NOTE — Telephone Encounter (Signed)
Patient needs to talk to someone about her medication she is having to go out of town and moved her appt to 07-16-16 please call her back today

## 2016-07-02 NOTE — Telephone Encounter (Signed)
Called patient. No answer. Refused Rx until med update form patient received per Dr. Moises Blood last note.

## 2016-07-02 NOTE — Telephone Encounter (Signed)
Received a call from Patient regarding medication: Venlafaxine.  Patient needs a refill of medication.  Yes  Patient having side effects from medication. Yes  Patient calling to update [place] on medication.  She has had some things going on with the dosage it is now. She was unsure if he would need to reduce the dosage for the refill or just call in enough until she comes in next Wednesday? She will run out of the medication after today. Thanks

## 2016-07-02 NOTE — Telephone Encounter (Signed)
Decrease venlafaxine back to 75mg  daily and monitor for improvement

## 2016-07-03 ENCOUNTER — Other Ambulatory Visit: Payer: Self-pay | Admitting: *Deleted

## 2016-07-03 MED ORDER — VENLAFAXINE HCL ER 75 MG PO CP24: 75.0000 mg | ORAL_CAPSULE | Freq: Every day | ORAL | 3 refills | Status: DC

## 2016-07-03 NOTE — Telephone Encounter (Signed)
Patient given instructions and Rx called in for 75 mg.

## 2016-07-06 ENCOUNTER — Other Ambulatory Visit: Payer: Self-pay | Admitting: *Deleted

## 2016-07-06 MED ORDER — VENLAFAXINE HCL ER 150 MG PO CP24: 150.0000 mg | ORAL_CAPSULE | Freq: Every day | ORAL | 5 refills | Status: DC

## 2016-07-08 ENCOUNTER — Ambulatory Visit: Payer: Self-pay | Admitting: Neurology

## 2016-07-16 ENCOUNTER — Ambulatory Visit (INDEPENDENT_AMBULATORY_CARE_PROVIDER_SITE_OTHER): Payer: Medicare Other | Admitting: Neurology

## 2016-07-16 ENCOUNTER — Encounter: Payer: Self-pay | Admitting: Neurology

## 2016-07-16 VITALS — BP 124/80 | HR 96 | Ht 63.5 in | Wt 143.0 lb

## 2016-07-16 DIAGNOSIS — F32A Depression, unspecified: Secondary | ICD-10-CM

## 2016-07-16 DIAGNOSIS — F329 Major depressive disorder, single episode, unspecified: Secondary | ICD-10-CM | POA: Diagnosis not present

## 2016-07-16 DIAGNOSIS — F419 Anxiety disorder, unspecified: Secondary | ICD-10-CM | POA: Diagnosis not present

## 2016-07-16 DIAGNOSIS — G43109 Migraine with aura, not intractable, without status migrainosus: Secondary | ICD-10-CM | POA: Diagnosis not present

## 2016-07-16 DIAGNOSIS — G43409 Hemiplegic migraine, not intractable, without status migrainosus: Secondary | ICD-10-CM

## 2016-07-16 DIAGNOSIS — F17200 Nicotine dependence, unspecified, uncomplicated: Secondary | ICD-10-CM

## 2016-07-16 NOTE — Progress Notes (Signed)
NEUROLOGY FOLLOW UP OFFICE NOTE  Lacey Salas [id number]  HISTORY OF PRESENT ILLNESS: Lacey Salas is a 71 year old right-handed woman with anxiety, depression, migraine, complex regional pain syndrome of right hand, hyperlipidemia, osteopenia, MVP and history of Stevens-Johnson syndrome with complex migraines who follows up for ocular migraine and hemiplegic migraine.   UPDATE: I Hemiplegic migraine: Intensity:  5-7/10 Duration:  15-20 minutes with Tylenol Frequency:  2 to 3 times in last 3 months  II Ocular migraine: At least once a month, from 10 to 20 minutes..  Current Antihypertensive medications:  atenolol 25mg  Current Antidepressant medications:  venlafaxine XR 75mg  (150mg  caused racing thoughts, however it helps with anxiety). Current Anticonvulsant medications:  no   HISTORY: She has been evaluated by several neurologists and headache specialists, such as Dr. , Dr. , Dr. , Dr. Avie Echevaria, and Dr. Fonnie Jarvis. Occurs spontaneously, including while driving. Onset:  Has history of typical migraines (with headache, nausea and vomiting) since age 42.  This subsided and started to have these current spells starting in her early 70s.  70% of the time it is accompanied by headache. Location:  holocephalic Quality:  Non-throbbing pressure Initial intensity:  5/10 Aura:  no Prodrome:  Indescribable "foreboding" feeling Associated symptoms:  Migraines typically present as TIA-like symptoms, including right-sided weakness involving face, arm and leg, photophobia, word-finding difficulties, difficulty getting words out, slurred speech, staggering gait.  She is conscious and alert but doesn't feel like she can function during the spell.  Following episodes, she is fatigued and will sleep for 2-3 hours.  On rare occasions, accompanied by field cut. Initial duration:  30 minutes Initial frequency:  Varies.  Usually once a month but may be 3 times  a week. Triggers/exacerbating factors:  Chemical smells, some perfumes, bright lights Relieving factors:  Laying down Activity:  Cannot function   Past abortive therapy:  none Past preventative therapy:  Amitriptyline (initially for complex regional pain syndrome), Sinequan, onriboflavin   Caffeine:  no Alcohol:  no Smoker:  Quit 34 years ago but started again due to recent tragedy Depression:  A very difficult year.  Both her parents passed away.  Her husband left her because he couldn't handle the stress related to the loss of his wife's parents. Family history of headache:  Maternal grandmother, sister  PAST MEDICAL HISTORY: Past Medical History:  Diagnosis Date  . Anxiety   . Depression   . GERD (gastroesophageal reflux disease)   . History of chicken pox   . Hyperlipidemia   . Migraine    complicated migraines with transient R side facial paralysis and aphasia  . MVP (mitral valve prolapse)   . Osteopenia   . Reflex sympathetic dystrophy, unspecified 2011   R hand related to wrist fx, improved s/p nerve blocks  . Seizures (HCC)   . Stevens-Johnson disease [facility])     MEDICATIONS: Current Outpatient Prescriptions on File Prior to Visit  Medication Sig Dispense Refill  . atenolol (TENORMIN) 25 MG tablet TAKE 1 TABLET (25 MG TOTAL) BY MOUTH DAILY. 90 tablet 3  . esomeprazole (NEXIUM) 20 MG capsule Take 1 capsule (20 mg total) by mouth daily before breakfast. OVC    . venlafaxine XR (EFFEXOR XR) 75 MG 24 hr capsule Take 1 capsule (75 mg total) by mouth daily with breakfast. 30 capsule 3  . LORazepam (ATIVAN) 0.5 MG tablet Take 1 tablet (0.5 mg total) by mouth daily as needed (flying). (Patient not taking: Reported on  03/31/2016) 5 tablet 0   No current facility-administered medications on file prior to visit.     ALLERGIES: Allergies  Allergen Reactions  . Cephalexin     REACTION: Levonne Spiller  . Epinephrine     REACTION: hypertensive reaction  . Erythromycin      REACTION: Rash  . Lactated Ringers     REACTION: cardiac symptoms  . Nortriptyline     Rapid heart rate  . Penicillins     REACTION: Rash  . Sulfamethoxazole-Trimethoprim     REACTION: Levonne Spiller  . Sulfonamide Derivatives   . Trimethoprim     REACTION: stevens johnson syndrome  . Wellbutrin [Bupropion] Other (See Comments)    tremors    FAMILY HISTORY: Family History  Problem Relation Age of Onset  . Leukemia Brother     CLL  . Arthritis Mother   . Arthritis Father   . Breast cancer Sister   . Arthritis Other   . Ovarian cancer Other   . Hyperlipidemia Other   . Hypertension Other   . Stroke Other     SOCIAL HISTORY: Social History   Social History  . Marital status: Married    Spouse name: N/A  . Number of children: N/A  . Years of education: N/A   Occupational History  . Not on file.   Social History Main Topics  . Smoking status: Current Every Day Smoker    Types: Cigarettes  . Smokeless tobacco: Never Used     Comment: Quit over 30 years ago( just needs it a bit now ) Stress   . Alcohol use No  . Drug use: No  . Sexual activity: No   Other Topics Concern  . Not on file   Social History Narrative  . No narrative on file    REVIEW OF SYSTEMS: Constitutional: No fevers, chills, or sweats, no generalized fatigue, change in appetite Eyes: No visual changes, double vision, eye pain Ear, nose and throat: No hearing loss, ear pain, nasal congestion, sore throat Cardiovascular: No chest pain, palpitations Respiratory:  No shortness of breath at rest or with exertion, wheezes GastrointestinaI: No nausea, vomiting, diarrhea, abdominal pain, fecal incontinence Genitourinary:  No dysuria, urinary retention or frequency Musculoskeletal:  No neck pain, back pain Integumentary: No rash, pruritus, skin lesions Neurological: as above Psychiatric: No depression, insomnia, anxiety Endocrine: No palpitations, fatigue, diaphoresis, mood swings, change in  appetite, change in weight, increased thirst Hematologic/Lymphatic:  No purpura, petechiae. Allergic/Immunologic: no itchy/runny eyes, nasal congestion, recent allergic reactions, rashes  PHYSICAL EXAM: Vitals:   07/16/16 0944  BP: 124/80  Pulse: 96   General: No acute distress.  Patient appears well-groomed.  normal body habitus. Head:  Normocephalic/atraumatic Eyes:  Fundi examined but not visualized Neck: supple, no paraspinal tenderness, full range of motion Heart:  Regular rate and rhythm Lungs:  Clear to auscultation bilaterally Back: No paraspinal tenderness Neurological Exam: alert and oriented to person, place, and time. Attention span and concentration intact, recent and remote memory intact, fund of knowledge intact.  Speech fluent and not dysarthric, language intact.  CN II-XII intact. Bulk and tone normal, muscle strength 5/5 throughout.  Sensation to light touch  intact.  Deep tendon reflexes 2+ throughout.  Finger to nose testing intact.  Gait normal  IMPRESSION: Hemiplegic migraine Ocular migraine Cigarette smoker Depression and anxiety  PLAN: We won't make any changes.  Hemiplegic migraines are stable and infrequent.  Ocular migraines do not cause any pain and are manageable.  She  will work on quitting smoking.  I reiterated that I feel she should see a psychiatrist and/or psychologist, but she declined.   Follow up in 5 months.  Shon Millet, DO  CC:  Hillard Danker, MD

## 2016-07-16 NOTE — Patient Instructions (Signed)
Continue venlafaxine XR 75mg  daily Try to work on quitting smoking Follow up in 5 months.

## 2016-07-21 DIAGNOSIS — H2511 Age-related nuclear cataract, right eye: Secondary | ICD-10-CM | POA: Diagnosis not present

## 2016-07-21 DIAGNOSIS — H25811 Combined forms of age-related cataract, right eye: Secondary | ICD-10-CM | POA: Diagnosis not present

## 2016-07-21 DIAGNOSIS — H25041 Posterior subcapsular polar age-related cataract, right eye: Secondary | ICD-10-CM | POA: Diagnosis not present

## 2016-07-21 DIAGNOSIS — H25011 Cortical age-related cataract, right eye: Secondary | ICD-10-CM | POA: Diagnosis not present

## 2016-07-26 DIAGNOSIS — M25561 Pain in right knee: Secondary | ICD-10-CM | POA: Diagnosis not present

## 2016-08-01 ENCOUNTER — Encounter: Payer: Self-pay | Admitting: Family Medicine

## 2016-08-01 ENCOUNTER — Ambulatory Visit (INDEPENDENT_AMBULATORY_CARE_PROVIDER_SITE_OTHER): Payer: Medicare Other | Admitting: Family Medicine

## 2016-08-01 VITALS — BP 124/88 | HR 93 | Temp 97.9°F | Wt 145.0 lb

## 2016-08-01 DIAGNOSIS — B029 Zoster without complications: Secondary | ICD-10-CM | POA: Diagnosis not present

## 2016-08-01 NOTE — Progress Notes (Signed)
[facility] at [facility] [address], [address] [place], [place] [address] [phone]  Date:  08/01/2016   Name:  Lacey Salas   DOB:  14-Nov-1945   MRN:  [id number]  PCP:  Myrlene Broker, MD    Chief Complaint: Rash (On left side of neck. ? shingles)   History of Present Illness:  Lacey Salas is a 72 y.o. very pleasant female patient who presents with the following:  History of RSD in an upper limb, but otherwise generally in good health. Here today with complaint of possible singles.  About 2 weeks ago she had itching on her left anterior neck and on the left side of the back of her head.   She developed a skin outbreak in this area.  This is now healing and has scabbed.  However she shows me a photo of what the area looked like earlier and it is c/w shingles.    The area on her neck seems to have healed over now and she thought she was ok. However, yesterday a friend told her that she might have had shingles and that she should be seen.    She also mentions that she wants to have surgery on her left foot, but does not want to undergo general anesthesia and wonders if I can help her find someone who will operate under a local. Also states that she feels "bad" in general- will feel tired, achy, like her hands are swollen.  States that when she bends over from the waist her throat will sometimes feel tight. Denies any CP, Fever or SOB.  This problem has been going on for 6-8 weeks and is not an acute problem.  No acute change in her condition  She is on prednisone eye drops - Dr. Randon Goldsmith is treating her for a RIGHT eye problem.  She is not on any oral prednisone. Her left eye is fine, no rash or lesion on her face  Patient Active Problem List   Diagnosis Date Noted  . RSD upper limb 09/02/2015  . Routine general medical examination at a health care facility 01/09/2014  . Hemiplegic migraine without status migrainosus, not intractable  12/05/2013  . HYPERSOMNIA 04/04/2010  . HYPERLIPIDEMIA 10/12/2006  . Adjustment disorder with mixed anxiety and depressed mood 10/11/2006  . GERD 10/11/2006  . OSTEOPENIA 10/11/2006  . STEVENS-JOHNSON SYNDROME, HX OF 10/11/2006    Past Medical History:  Diagnosis Date  . Anxiety   . Depression   . GERD (gastroesophageal reflux disease)   . History of chicken pox   . Hyperlipidemia   . Migraine    complicated migraines with transient R side facial paralysis and aphasia  . MVP (mitral valve prolapse)   . Osteopenia   . Reflex sympathetic dystrophy, unspecified 2011   R hand related to wrist fx, improved s/p nerve blocks  . Seizures (HCC)   . Stevens-Johnson disease [facility])     Past Surgical History:  Procedure Laterality Date  . ABDOMINAL HYSTERECTOMY     BSO  . HEEL SPUR SURGERY  12/1996   right  . ORIF DISTAL RADIUS FRACTURE  2004    Social History  Substance Use Topics  . Smoking status: Current Every Day Smoker    Types: Cigarettes  . Smokeless tobacco: Never Used     Comment: Quit over 30 years ago( just needs it a bit now ) Stress   . Alcohol use No  Family History  Problem Relation Age of Onset  . Leukemia Brother        CLL  . Arthritis Mother   . Arthritis Father   . Breast cancer Sister   . Arthritis Other   . Ovarian cancer Other   . Hyperlipidemia Other   . Hypertension Other   . Stroke Other     Allergies  Allergen Reactions  . Cephalexin     REACTION: Levonne Spiller  . Epinephrine     REACTION: hypertensive reaction  . Erythromycin     REACTION: Rash  . Lactated Ringers     REACTION: cardiac symptoms  . Nortriptyline     Rapid heart rate  . Penicillins     REACTION: Rash  . Sulfamethoxazole-Trimethoprim     REACTION: Levonne Spiller  . Sulfonamide Derivatives   . Trimethoprim     REACTION: stevens johnson syndrome  . Wellbutrin [Bupropion] Other (See Comments)    tremors    Medication list has been reviewed and  updated.  Current Outpatient Prescriptions on File Prior to Visit  Medication Sig Dispense Refill  . acetaminophen (TYLENOL) 500 MG tablet Take 500 mg by mouth as needed.    Marland Kitchen atenolol (TENORMIN) 25 MG tablet TAKE 1 TABLET (25 MG TOTAL) BY MOUTH DAILY. 90 tablet 3  . esomeprazole (NEXIUM) 20 MG capsule Take 1 capsule (20 mg total) by mouth daily before breakfast. OVC    . LORazepam (ATIVAN) 0.5 MG tablet Take 1 tablet (0.5 mg total) by mouth daily as needed (flying). 5 tablet 0  . venlafaxine XR (EFFEXOR XR) 75 MG 24 hr capsule Take 1 capsule (75 mg total) by mouth daily with breakfast. 30 capsule 3   No current facility-administered medications on file prior to visit.     Review of Systems:  As per HPI- otherwise negative.   Physical Examination: Vitals:   08/01/16 0947  BP: 124/88  Pulse: 93  Temp: 97.9 F (36.6 C)   Vitals:   08/01/16 0947  Weight: 145 lb (65.8 kg)   Body mass index is 25.28 kg/m. Ideal Body Weight:    GEN: WDWN, NAD, Non-toxic, A & O x 3, normal weight, looks well HEENT: Atraumatic, Normocephalic. Neck supple. No masses, No LAD.  Bilateral TM wnl, oropharynx normal.  PEERL,EOMI.   She has what appears to be a healing shingles outbreak on her left sided anterior neck.  It is scabbed and there are no active vesicles Ears and Nose: No external deformity. CV: RRR, No M/G/R. No JVD. No thrill. No extra heart sounds. PULM: CTA B, no wheezes, crackles, rhonchi. No retractions. No resp. distress. No accessory muscle use. ABD: S, NT, ND, +BS. No rebound. No HSM. EXTR: No c/c/e NEURO Normal gait.  PSYCH: Normally interactive. Conversant. Not depressed or anxious appearing.  Calm demeanor.    Assessment and Plan: Herpes zoster without complication  Here today to discuss a likely shingles outbreak from 2 weeks ago.  Her rash is now mostly healed and she declines to use valtrex at this juncture, which is reasonable.  Advised that I am not skilled in foot  surgery or anesthesiology and cannot advise her about this concern She states that she feels "bad" overall and I suggested that we have her seen at an UC clinic with labs available. She declines to do this as "this is not really new, I have felt this way for weeks."  She agrees to see her PCP next week to discus her more  chronic issues    Signed Abbe Amsterdam, MD

## 2016-08-04 DIAGNOSIS — T8522XA Displacement of intraocular lens, initial encounter: Secondary | ICD-10-CM | POA: Diagnosis not present

## 2016-08-18 ENCOUNTER — Telehealth: Payer: Self-pay | Admitting: Internal Medicine

## 2016-08-18 NOTE — Telephone Encounter (Signed)
Left msg for pt to call back

## 2016-08-18 NOTE — Telephone Encounter (Signed)
Pt would like to transfer from Dr. Okey Dupre to NP, Kordsmeier.  Will the transfer be okay?

## 2016-08-18 NOTE — Telephone Encounter (Signed)
Pt scheduled and said thank you so much.

## 2016-08-18 NOTE — Telephone Encounter (Signed)
Okay with me 

## 2016-08-18 NOTE — Telephone Encounter (Signed)
Fine with me

## 2016-08-18 NOTE — Telephone Encounter (Signed)
error 

## 2016-08-26 ENCOUNTER — Other Ambulatory Visit: Payer: Self-pay | Admitting: Internal Medicine

## 2016-08-27 ENCOUNTER — Encounter: Payer: Self-pay | Admitting: Family Medicine

## 2016-08-27 ENCOUNTER — Ambulatory Visit (INDEPENDENT_AMBULATORY_CARE_PROVIDER_SITE_OTHER): Payer: Medicare Other | Admitting: Family Medicine

## 2016-08-27 VITALS — BP 120/78 | HR 88 | Temp 98.4°F | Ht 63.0 in | Wt 145.0 lb

## 2016-08-27 DIAGNOSIS — Z1239 Encounter for other screening for malignant neoplasm of breast: Secondary | ICD-10-CM

## 2016-08-27 DIAGNOSIS — F419 Anxiety disorder, unspecified: Secondary | ICD-10-CM | POA: Diagnosis not present

## 2016-08-27 DIAGNOSIS — F329 Major depressive disorder, single episode, unspecified: Secondary | ICD-10-CM

## 2016-08-27 DIAGNOSIS — Z23 Encounter for immunization: Secondary | ICD-10-CM | POA: Diagnosis not present

## 2016-08-27 DIAGNOSIS — G43109 Migraine with aura, not intractable, without status migrainosus: Secondary | ICD-10-CM

## 2016-08-27 DIAGNOSIS — G43409 Hemiplegic migraine, not intractable, without status migrainosus: Secondary | ICD-10-CM

## 2016-08-27 DIAGNOSIS — Z1231 Encounter for screening mammogram for malignant neoplasm of breast: Secondary | ICD-10-CM | POA: Diagnosis not present

## 2016-08-27 DIAGNOSIS — Z7689 Persons encountering health services in other specified circumstances: Secondary | ICD-10-CM | POA: Diagnosis not present

## 2016-08-27 DIAGNOSIS — F32A Depression, unspecified: Secondary | ICD-10-CM

## 2016-08-27 NOTE — Patient Instructions (Addendum)
It was a pleasure to see you today.  Please schedule a physical at your convenience.  Also, please schedule an annual wellness visit with Montine Circle, RN at this office as this is a benefit provided by Medicare for you.  You will be contact about your referral.  Please let us know if you have not heard back within 1 week about your referral.

## 2016-08-27 NOTE — Progress Notes (Signed)
Patient ID: Lacey Salas, female   DOB: Nov 23, 1945, 71 y.o.   MRN: [id number]  Patient presents to clinic today to establish care.  Chronic Issues: Migraines:  She has been evaluated by more than one neurologist and headache specialists for migraines. This has been an ongoing issue since 71 years of age. Her hemiplegic migraines present as TIA-like symptoms as reported by neurology with photophobia, and right sided weakness involving face, arm, and leg. She denies having this type of HA in "quite some time" and reports treatment with venlafaxine from neurology has provided excellent benefit. Ocular migraines are also noted and do not cause pain. These are stable/managable and she is followed by neurology.  She also takes atenolol for rate control  and was advised to remain on this medication as a trial of stopping this medication increased migraine frequency. She denies adverse effects of this medication.  Anxiety and depression are stable per patient. She denies suicidal/homicidal ideation or plan. She reports benefit from venlafaxine and has not initiated counseling.  She denies changes in appetite, sleep, focus/concentration. She does smoke more than she would like to  Health Maintenance: Dental -- Yearly; she is due Vision --Yearly; she has upcoming left cataract surgery Immunizations --She is due for Tdap; She will consider ShingRx and also pneumonia vaccines. Colonoscopy --She is due; last colonoscopy 2007 Mammogram --She is due at this time.  PAP -- Hysterectomy over 15 years ago Bone Density -- Needed   Past Medical History:  Diagnosis Date  . Anxiety   . Depression   . GERD (gastroesophageal reflux disease)   . History of chicken pox   . Hyperlipidemia   . Migraine    complicated migraines with transient R side facial paralysis and aphasia  . MVP (mitral valve prolapse)   . Osteopenia   . Reflex sympathetic dystrophy, unspecified 2011   R hand related to wrist fx, improved s/p  nerve blocks  . Seizures (HCC)   . Stevens-Johnson disease [facility])     Past Surgical History:  Procedure Laterality Date  . ABDOMINAL HYSTERECTOMY     BSO  . HEEL SPUR SURGERY  12/1996   right  . ORIF DISTAL RADIUS FRACTURE  2004    Current Outpatient Prescriptions on File Prior to Visit  Medication Sig Dispense Refill  . acetaminophen (TYLENOL) 500 MG tablet Take 500 mg by mouth as needed.    Marland Kitchen atenolol (TENORMIN) 25 MG tablet Take 1 tablet (25 mg total) by mouth daily. Overdue for annual appt must see MD for refills 30 tablet 0  . esomeprazole (NEXIUM) 20 MG capsule Take 1 capsule (20 mg total) by mouth daily before breakfast. OVC    . LORazepam (ATIVAN) 0.5 MG tablet Take 1 tablet (0.5 mg total) by mouth daily as needed (flying). 5 tablet 0  . venlafaxine XR (EFFEXOR XR) 75 MG 24 hr capsule Take 1 capsule (75 mg total) by mouth daily with breakfast. 30 capsule 3   No current facility-administered medications on file prior to visit.     Allergies  Allergen Reactions  . Cephalexin     REACTION: Levonne Spiller  . Epinephrine     REACTION: hypertensive reaction  . Erythromycin     REACTION: Rash  . Lactated Ringers     REACTION: cardiac symptoms  . Nortriptyline     Rapid heart rate  . Penicillins     REACTION: Rash  . Sulfamethoxazole-Trimethoprim     REACTION: Levonne Spiller  . Sulfonamide Derivatives   .  Trimethoprim     REACTION: stevens johnson syndrome  . Wellbutrin [Bupropion] Other (See Comments)    tremors    Family History  Problem Relation Age of Onset  . Leukemia Brother        CLL  . Arthritis Mother   . Arthritis Father   . Breast cancer Sister   . Arthritis Other   . Ovarian cancer Other   . Hyperlipidemia Other   . Hypertension Other   . Stroke Other     Social History   Social History  . Marital status: Married    Spouse name: N/A  . Number of children: N/A  . Years of education: N/A   Occupational History  . Not on file.    Social History Main Topics  . Smoking status: Current Every Day Smoker    Types: Cigarettes  . Smokeless tobacco: Never Used     Comment: Quit over 30 years ago( just needs it a bit now ) Stress   . Alcohol use No  . Drug use: No  . Sexual activity: No   Other Topics Concern  . Not on file   Social History Narrative  . No narrative on file    Review of Systems  Constitutional: Negative for chills and malaise/fatigue.  Respiratory: Negative for cough and shortness of breath.   Cardiovascular: Negative for chest pain, palpitations and leg swelling.  Gastrointestinal: Negative for abdominal pain, blood in stool, constipation, diarrhea, heartburn, nausea and vomiting.  Genitourinary: Negative for dysuria.  Musculoskeletal: Negative for myalgias.  Skin: Negative for rash.  Neurological: Negative for dizziness, tingling and headaches.  Psychiatric/Behavioral:       Denies depressed or anxious mood today.     BP 120/78 (BP Location: Left Arm, Patient Position: Sitting, Cuff Size: Normal)   Pulse 88   Temp 98.4 F (36.9 C)   Ht 5\' 3"  (1.6 m)   Wt 145 lb (65.8 kg)   SpO2 98%   BMI 25.69 kg/m   Physical Exam  Constitutional: She is oriented to person, place, and time and well-developed, well-nourished, and in no distress.  HENT:  Right Ear: Tympanic membrane normal.  Left Ear: Tympanic membrane normal.  Nose: No rhinorrhea.  Mouth/Throat: Oropharynx is clear and moist.  Eyes: Pupils are equal, round, and reactive to light. No scleral icterus.  Neck: Neck supple.  Cardiovascular: Normal rate, regular rhythm and intact distal pulses.   Pulmonary/Chest: Effort normal and breath sounds normal. She has no wheezes. She has no rales.  Abdominal: Soft. Bowel sounds are normal. There is no tenderness.  Musculoskeletal: She exhibits no edema.  Lymphadenopathy:    She has no cervical adenopathy.  Neurological: She is alert and oriented to person, place, and time.  Skin: Skin is  warm and dry. No rash noted.  Psychiatric: Mood, memory, affect and judgment normal.    Assessment/Plan:  1. Anxiety and depression Stable; continue venlafaxine as prescribed; consider initiation of counseling and smoking cessation.  2. Hemiplegic migraine without status migrainosus, not intractable Stable; continue venlafaxine; follow up with neurology as recommended.  3. Ocular migraine Stable; follow up with neurology as recommended  4. Screening for breast cancer  - MM Digital Screening; Future  5. Need for Tdap vaccination  - Tdap vaccine greater than or equal to 7yo IM   6. Encounter to establish care We reviewed the PMH, PSH, FH, SH, Meds and Allergies. -We provided refills for any medications we will prescribe as needed. -We addressed current  concerns per orders and patient instructions. -We have asked for records for pertinent exams, studies, vaccines and notes from previous providers. -We have advised patient to follow up per instructions below.   -Patient advised to return or notify a provider immediately if symptoms worsen or persist or new concerns arise.  Advised patient to follow up for physical and lab work for preventive care. Also advised her to schedule AWV with Montine Circle, RN.  Roddie Mc, FNP-C

## 2016-09-01 DIAGNOSIS — H25011 Cortical age-related cataract, right eye: Secondary | ICD-10-CM | POA: Diagnosis not present

## 2016-09-01 DIAGNOSIS — H25812 Combined forms of age-related cataract, left eye: Secondary | ICD-10-CM | POA: Diagnosis not present

## 2016-09-01 DIAGNOSIS — H25041 Posterior subcapsular polar age-related cataract, right eye: Secondary | ICD-10-CM | POA: Diagnosis not present

## 2016-09-01 DIAGNOSIS — H2511 Age-related nuclear cataract, right eye: Secondary | ICD-10-CM | POA: Diagnosis not present

## 2016-09-08 ENCOUNTER — Encounter: Payer: Self-pay | Admitting: Internal Medicine

## 2016-09-24 NOTE — Progress Notes (Signed)
Subjective:   Lacey Salas is a 71 y.o. female who presents for an Initial Medicare Annual Wellness Visit.  Review of Systems    No ROS.  Medicare Wellness Visit. Additional risk factors are reflected in the social history.  Cardiac Risk Factors include: advanced age (>53men, >78 women);dyslipidemia;smoking/ tobacco exposure   Sleep patterns:  6 hrs/night. Wakes up feeling rested. Home Safety/Smoke Alarms: Feels safe in home. Smoke alarms in place.  Living environment; residence and Firearm Safety: Lives in 2 story home. Husband is moving home after a several year separation.  Seat Belt Safety/Bike Helmet: Wears seat belt.   Counseling:   Dental- Owes money to dentist so she does not want to go back, suggested Futures trader program.  Female:   Pap-      Aged out. Mammo-   02/18/2012   Negative. Ordered at last visit.  Dexa scan-    Declined.    CCS-  06/04/2005. Pt will call [facility] to get scheduled.     Objective:    Today's Vitals   10/01/16 1123  BP: 128/72  Resp: 16  SpO2: 97%  Weight: 145 lb (65.8 kg)  Height: 5\' 3"  (1.6 m)   Body mass index is 25.69 kg/m.   Current Medications (verified) Outpatient Encounter Prescriptions as of 10/01/2016  Medication Sig  . acetaminophen (TYLENOL) 500 MG tablet Take 500 mg by mouth as needed.  Marland Kitchen atenolol (TENORMIN) 25 MG tablet Take 1 tablet (25 mg total) by mouth daily. Overdue for annual appt must see MD for refills  . esomeprazole (NEXIUM) 20 MG capsule Take 1 capsule (20 mg total) by mouth daily before breakfast. OVC  . LORazepam (ATIVAN) 0.5 MG tablet Take 1 tablet (0.5 mg total) by mouth daily as needed (flying).  . venlafaxine XR (EFFEXOR XR) 75 MG 24 hr capsule Take 1 capsule (75 mg total) by mouth daily with breakfast.  . [DISCONTINUED] moxifloxacin (VIGAMOX) 0.5 % ophthalmic solution PLACE 1 DROP INTO RIGHT EYE 4 TIMES A DAY *STARTING 2 DAYS PRIOR TO SURGERY & 2 DROPS THE MORNING OF  . [DISCONTINUED] prednisoLONE  acetate (PRED FORTE) 1 % ophthalmic suspension PLACE 1 DROP INTO RIGHT EYE 4 TIMES A DAY *STARTING AFTER SURGERY*  . [DISCONTINUED] venlafaxine XR (EFFEXOR-XR) 150 MG 24 hr capsule    No facility-administered encounter medications on file as of 10/01/2016.     Allergies (verified) Cephalexin; Epinephrine; Erythromycin; Lactated ringers; Nortriptyline; Penicillins; Sulfamethoxazole-trimethoprim; Sulfonamide derivatives; Trimethoprim; and Wellbutrin [bupropion]   History: Past Medical History:  Diagnosis Date  . Anxiety   . Depression   . GERD (gastroesophageal reflux disease)   . History of chicken pox   . Hyperlipidemia   . Migraine    complicated migraines with transient R side facial paralysis and aphasia  . MVP (mitral valve prolapse)   . Osteopenia   . Reflex sympathetic dystrophy, unspecified 2011   R hand related to wrist fx, improved s/p nerve blocks  . Seizures (HCC)   . Stevens-Johnson disease [facility])    Past Surgical History:  Procedure Laterality Date  . ABDOMINAL HYSTERECTOMY     BSO  . CATARACT EXTRACTION    . HEEL SPUR SURGERY  12/1996   right  . ORIF DISTAL RADIUS FRACTURE  2004   Family History  Problem Relation Age of Onset  . Leukemia Brother        CLL  . Arthritis Mother   . Arthritis Father   . Breast cancer Sister   .  Arthritis Other   . Ovarian cancer Other   . Hyperlipidemia Other   . Hypertension Other   . Stroke Other    Social History   Occupational History  . Not on file.   Social History Main Topics  . Smoking status: Current Every Day Smoker    Packs/day: 1.00    Types: Cigarettes  . Smokeless tobacco: Never Used     Comment: Quit over 30 years ago( just needs it a bit now ) Stress   . Alcohol use No  . Drug use: No  . Sexual activity: No    Tobacco Counseling Ready to quit: Yes Counseling given: Yes   Activities of Daily Living In your present state of health, do you have any difficulty performing the following  activities: 10/01/2016  Hearing? N  Vision? N  Difficulty concentrating or making decisions? Y  Walking or climbing stairs? N  Dressing or bathing? N  Doing errands, shopping? N  Preparing Food and eating ? N  Using the Toilet? N  In the past six months, have you accidently leaked urine? Y  Do you have problems with loss of bowel control? N  Managing your Medications? N  Managing your Finances? N  Housekeeping or managing your Housekeeping? N  Some recent data might be hidden    Immunizations and Health Maintenance Immunization History  Administered Date(s) Administered  . Influenza Split 11/24/2011  . Influenza Whole 12/27/2007, 03/23/2009  . Influenza,inj,Quad PF,36+ Mos 12/06/2012, 01/09/2014  . Td 03/16/1992, 12/06/2006  . Tdap 08/27/2016   Health Maintenance Due  Topic Date Due  . Hepatitis C Screening  27-Jan-1946  . PNA vac Low Risk Adult (1 of 2 - PCV13) 10/31/2011  . MAMMOGRAM  02/17/2014  . COLONOSCOPY  06/05/2015    Patient Care Team: Roddie Mc, FNP as PCP - General (Family Medicine)  Indicate any recent Medical Services you may have received from other than [facility] providers in the past year (date may be approximate).     Assessment:   This is a routine wellness examination for Lacey Salas. Physical assessment deferred to PCP.   Hearing/Vision screen Hearing Screening Comments: Able to hear conversational tones w/o difficulty. No issues reported.   Vision Screening Comments: Cataracts recently removed. Wears reading glasses.  Dietary issues and exercise activities discussed: Current Exercise Habits: The patient does not participate in regular exercise at present, Exercise limited by: None identified Diet (meal preparation, eat out, water intake, caffeinated beverages, dairy products, fruits and vegetables): 1 bottled water/day. Suggested she increase water intake. No caffeine intake. Mostly eats chicken, no red meat. States she eats dark chocolate.  Goals     . Figure out what is going on with health      Depression Screen PHQ 2/9 Scores 10/01/2016 09/02/2015 09/02/2015 05/16/2014 11/24/2011  PHQ - 2 Score 0 3 0 2 3  PHQ- 9 Score - 16 - 12 7    Fall Risk Fall Risk  10/01/2016 03/31/2016 09/02/2015 05/16/2014 11/24/2011  Falls in the past year? No No No No -  Risk for fall due to : - - - - History of fall(s)    Cognitive Function:   Montreal Cognitive Assessment  03/06/2014  Visuospatial/ Executive (0/5) 4  Naming (0/3) 3  Attention: Read list of digits (0/2) 2  Attention: Read list of letters (0/1) 1  Attention: Serial 7 subtraction starting at 100 (0/3) 2  Language: Repeat phrase (0/2) 2  Language : Fluency (0/1) 1  Abstraction (0/2) 2  Delayed Recall (0/5) 2  Orientation (0/6) 6  Total 25  Adjusted Score (based on education) 25      Screening Tests Health Maintenance  Topic Date Due  . Hepatitis C Screening  [birth year]  . PNA vac Low Risk Adult (1 of 2 - PCV13) 10/31/2011  . MAMMOGRAM  02/17/2014  . COLONOSCOPY  06/05/2015  . DEXA SCAN  10/01/2017 (Originally 10/31/2011)  . INFLUENZA VACCINE  10/14/2016  . TETANUS/TDAP  08/28/2026      Plan:    Schedule visit with Roddie Mc, FNP to discuss knot on heel, your desire to quit smoking and feeling chronically tired and difficulty concentrating.  Bring a copy of your advance directives to your next office visit.  Schedule mammogram and colonoscopy.  Call insurance company regarding coverage of immunizations.  I have personally reviewed and noted the following in the patient's chart:   . Medical and social history . Use of alcohol, tobacco or illicit drugs  . Current medications and supplements . Functional ability and status . Nutritional status . Physical activity . Advanced directives . List of other physicians . Vitals . Screenings to include cognitive, depression, and falls . Referrals and appointments  In addition, I have reviewed and discussed with  patient certain preventive protocols, quality metrics, and best practice recommendations. A written personalized care plan for preventive services as well as general preventive health recommendations were provided to patient.     Richelle Ito, RN   10/01/2016

## 2016-09-24 NOTE — Progress Notes (Signed)
Pre visit review using our clinic review tool, if applicable. No additional management support is needed unless otherwise documented below in the visit note. 

## 2016-10-01 ENCOUNTER — Ambulatory Visit (INDEPENDENT_AMBULATORY_CARE_PROVIDER_SITE_OTHER): Payer: Medicare Other

## 2016-10-01 VITALS — BP 128/72 | Resp 16 | Ht 63.0 in | Wt 145.0 lb

## 2016-10-01 DIAGNOSIS — Z0000 Encounter for general adult medical examination without abnormal findings: Secondary | ICD-10-CM | POA: Diagnosis not present

## 2016-10-01 NOTE — Progress Notes (Signed)
I have reviewed and agree with note, evaluation, plan.   Roddie Mc, FNP

## 2016-10-01 NOTE — Patient Instructions (Addendum)
Schedule visit with Delano Metz, FNP. Bring a copy of your advance directives to your next office visit. Schedule mammogram and colonoscopy. Call insurance company regarding coverage of immunizations.  Preventive Care 1 Years and Older, Female Preventive care refers to lifestyle choices and visits with your health care provider that can promote health and wellness. What does preventive care include?  A yearly physical exam. This is also called an annual well check.  Dental exams once or twice a year.  Routine eye exams. Ask your health care provider how often you should have your eyes checked.  Personal lifestyle choices, including: ? Daily care of your teeth and gums. ? Regular physical activity. ? Eating a healthy diet. ? Avoiding tobacco and drug use. ? Limiting alcohol use. ? Practicing safe sex. ? Taking low-dose aspirin every day. ? Taking vitamin and mineral supplements as recommended by your health care provider. What happens during an annual well check? The services and screenings done by your health care provider during your annual well check will depend on your age, overall health, lifestyle risk factors, and family history of disease. Counseling Your health care provider may ask you questions about your:  Alcohol use.  Tobacco use.  Drug use.  Emotional well-being.  Home and relationship well-being.  Sexual activity.  Eating habits.  History of falls.  Memory and ability to understand (cognition).  Work and work Statistician.  Reproductive health.  Screening You may have the following tests or measurements:  Height, weight, and BMI.  Blood pressure.  Lipid and cholesterol levels. These may be checked every 5 years, or more frequently if you are over 64 years old.  Skin check.  Lung cancer screening. You may have this screening every year starting at age 29 if you have a 30-pack-year history of smoking and currently smoke or have quit  within the past 15 years.  Fecal occult blood test (FOBT) of the stool. You may have this test every year starting at age 55.  Flexible sigmoidoscopy or colonoscopy. You may have a sigmoidoscopy every 5 years or a colonoscopy every 10 years starting at age 62.  Hepatitis C blood test.  Hepatitis B blood test.  Sexually transmitted disease (STD) testing.  Diabetes screening. This is done by checking your blood sugar (glucose) after you have not eaten for a while (fasting). You may have this done every 1-3 years.  Bone density scan. This is done to screen for osteoporosis. You may have this done starting at age 66.  Mammogram. This may be done every 1-2 years. Talk to your health care provider about how often you should have regular mammograms.  Talk with your health care provider about your test results, treatment options, and if necessary, the need for more tests. Vaccines Your health care provider may recommend certain vaccines, such as:  Influenza vaccine. This is recommended every year.  Tetanus, diphtheria, and acellular pertussis (Tdap, Td) vaccine. You may need a Td booster every 10 years.  Varicella vaccine. You may need this if you have not been vaccinated.  Zoster vaccine. You may need this after age 81.  Measles, mumps, and rubella (MMR) vaccine. You may need at least one dose of MMR if you were born in 1957 or later. You may also need a second dose.  Pneumococcal 13-valent conjugate (PCV13) vaccine. One dose is recommended after age 24.  Pneumococcal polysaccharide (PPSV23) vaccine. One dose is recommended after age 41.  Meningococcal vaccine. You may need this if you have  certain conditions.  Hepatitis A vaccine. You may need this if you have certain conditions or if you travel or work in places where you may be exposed to hepatitis A.  Hepatitis B vaccine. You may need this if you have certain conditions or if you travel or work in places where you may be exposed  to hepatitis B.  Haemophilus influenzae type b (Hib) vaccine. You may need this if you have certain conditions.  Talk to your health care provider about which screenings and vaccines you need and how often you need them. This information is not intended to replace advice given to you by your health care provider. Make sure you discuss any questions you have with your health care provider. Document Released: 03/29/2015 Document Revised: 11/20/2015 Document Reviewed: 01/01/2015 Elsevier Interactive Patient Education  2017 [employer].

## 2016-10-02 ENCOUNTER — Other Ambulatory Visit: Payer: Self-pay | Admitting: Neurology

## 2016-10-02 MED ORDER — VENLAFAXINE HCL ER 75 MG PO CP24: 75.0000 mg | ORAL_CAPSULE | Freq: Every day | ORAL | 1 refills | Status: DC

## 2016-10-19 ENCOUNTER — Encounter: Payer: Self-pay | Admitting: Family Medicine

## 2016-10-19 ENCOUNTER — Ambulatory Visit (INDEPENDENT_AMBULATORY_CARE_PROVIDER_SITE_OTHER): Payer: Medicare Other | Admitting: Family Medicine

## 2016-10-19 VITALS — BP 118/80 | Temp 98.5°F | Ht 63.0 in | Wt 147.0 lb

## 2016-10-19 DIAGNOSIS — Z1211 Encounter for screening for malignant neoplasm of colon: Secondary | ICD-10-CM | POA: Diagnosis not present

## 2016-10-19 DIAGNOSIS — G43109 Migraine with aura, not intractable, without status migrainosus: Secondary | ICD-10-CM | POA: Diagnosis not present

## 2016-10-19 DIAGNOSIS — M79673 Pain in unspecified foot: Secondary | ICD-10-CM

## 2016-10-19 DIAGNOSIS — Z720 Tobacco use: Secondary | ICD-10-CM | POA: Diagnosis not present

## 2016-10-19 DIAGNOSIS — F329 Major depressive disorder, single episode, unspecified: Secondary | ICD-10-CM | POA: Diagnosis not present

## 2016-10-19 DIAGNOSIS — M8580 Other specified disorders of bone density and structure, unspecified site: Secondary | ICD-10-CM

## 2016-10-19 DIAGNOSIS — F419 Anxiety disorder, unspecified: Secondary | ICD-10-CM

## 2016-10-19 DIAGNOSIS — F32A Depression, unspecified: Secondary | ICD-10-CM

## 2016-10-19 LAB — CBC WITH DIFFERENTIAL/PLATELET
BASOS PCT: 0.8 % (ref 0.0–3.0)
Basophils Absolute: 0.1 10*3/uL (ref 0.0–0.1)
EOS PCT: 2 % (ref 0.0–5.0)
Eosinophils Absolute: 0.2 10*3/uL (ref 0.0–0.7)
HEMATOCRIT: 39.7 % (ref 36.0–46.0)
Hemoglobin: 13.1 g/dL (ref 12.0–15.0)
LYMPHS ABS: 2.1 10*3/uL (ref 0.7–4.0)
LYMPHS PCT: 26 % (ref 12.0–46.0)
MCHC: 33.1 g/dL (ref 30.0–36.0)
MCV: 91 fl (ref 78.0–100.0)
MONOS PCT: 8.8 % (ref 3.0–12.0)
Monocytes Absolute: 0.7 10*3/uL (ref 0.1–1.0)
NEUTROS ABS: 5 10*3/uL (ref 1.4–7.7)
NEUTROS PCT: 62.4 % (ref 43.0–77.0)
PLATELETS: 314 10*3/uL (ref 150.0–400.0)
RBC: 4.36 Mil/uL (ref 3.87–5.11)
RDW: 13.5 % (ref 11.5–15.5)
WBC: 8 10*3/uL (ref 4.0–10.5)

## 2016-10-19 LAB — LIPID PANEL
CHOL/HDL RATIO: 6
CHOLESTEROL: 198 mg/dL (ref 0–200)
HDL: 35 mg/dL — AB (ref >39.00)
LDL CALC: 125 mg/dL — AB (ref 0–99)
NonHDL: 162.9
TRIGLYCERIDES: 192 mg/dL — AB (ref 0.0–149.0)
VLDL: 38.4 mg/dL (ref 0.0–40.0)

## 2016-10-19 LAB — HEPATIC FUNCTION PANEL
ALT: 11 U/L (ref 0–35)
AST: 13 U/L (ref 0–37)
Albumin: 3.9 g/dL (ref 3.5–5.2)
Alkaline Phosphatase: 90 U/L (ref 39–117)
BILIRUBIN DIRECT: 0.1 mg/dL (ref 0.0–0.3)
BILIRUBIN TOTAL: 0.4 mg/dL (ref 0.2–1.2)
TOTAL PROTEIN: 6.6 g/dL (ref 6.0–8.3)

## 2016-10-19 LAB — BASIC METABOLIC PANEL
BUN: 11 mg/dL (ref 6–23)
CALCIUM: 8.8 mg/dL (ref 8.4–10.5)
CO2: 26 mEq/L (ref 19–32)
CREATININE: 0.52 mg/dL (ref 0.40–1.20)
Chloride: 108 mEq/L (ref 96–112)
GFR: 123.92 mL/min (ref >60.00)
Glucose, Bld: 95 mg/dL (ref 70–99)
Potassium: 4 mEq/L (ref 3.5–5.1)
Sodium: 142 mEq/L (ref 135–145)

## 2016-10-19 LAB — TSH: TSH: 1.95 u[IU]/mL (ref 0.35–4.50)

## 2016-10-19 NOTE — Progress Notes (Signed)
Subjective:    Patient ID: Lacey Salas, female    DOB: Jun 09, 1945, 71 y.o.   MRN: [id number]  HPI  Lacey Salas is a 71 year old female who presents today for routine physical care and follow up of chronic conditions. She reports being overdue for routine screenings and care. She has a history of anxiety, depression, migraine, with complex migraines.  Migraines: she is followed by neurology for migraines and was evaluated 07/16/16.  Hemiplegic migraine and ocular migraine have been controlled with medications.  Current medications include  Atenolol 25 mg daily and venlafaxine 75 mg daily.  She is a current smoker and states that she has considered tobacco cessation but is not ready to do so at this time.  Heel spur has been present per patient. She was evaluated by orthopedics and has had treatment with a boot that provided benefit. She reports having discomfort if she does not wear shoes with support. She is interested in being evaluated by a podiatrist to determine further treatment.  Anxiety and depression: On venlafaxine and reports that mood is good overall. She has experienced many changes including a separation and divorce from her husband last year. Prior [place] counseling provided limited benefit. She denies changes in appetite, sleep, focus/concentration.  She denies suicidal/homicidal ideation   Reviewed health maintenance protocols including mammography, colonoscopy, bone density, and reviewed appropriate screening labs.  Her immunization history was reviewed as well as her current medications and allergies. Refills of her chronic medications were given and the plan for yearly health maintenance was discussed.  All orders and referrals were made as appropriate.   Review of Systems Constitutional: No fever, chills, significant weight change, fatigue, weakness or night sweats Eyes: No redness, discharge, pain, blurred vision, double vision, or loss of vision ENT/mouth: No nasal  congestion, postnasal drainage,epistaxis, purulent discharge, earache, hearing loss, tinnitus ,sore throat , dental pain, or hoarseness   Cardiovascular: no chest pain, palpitations, racing, irregular rhythm, syncope, nausea, sweating, claudication, or edema  Respiratory: No cough, sputum production,hemoptysis,  dyspnea, paroxysmal nocturnal dyspnea, pleuritic chest pain, significant snoring, or  apnea    Gastrointestinal: No heartburn,dysphagia, nausea and vomiting,ominal pain, change in bowels, anorexia, diarrhea, significant constipation, rectal bleeding, melena,  stool incontinence or jaundice Genitourinary: No dysuria,hematuria, pyuria, frequency, urgency,  incontinence, nocturia, dark urine or flank pain Musculoskeletal: No myalgias or muscle cramping, joint stiffness, joint swelling, joint color change, weakness, or cyanosis. History of heel spur an pain with certain footwear Dermatologic: No rash, pruritus, urticaria, or change in color or temperature of skin Neurologic: No headache, vertigo, limb weakness, tremor, gait disturbance, seizures, memory loss, numbness or tingling Psychiatric: No significant anxiety or depression, anhedonia, panic attacks, insomnia, or anorexia Endocrine: No change in hair/skin/ nails, excessive thirst, excessive hunger, excessive urination, or unexplained fatigue Hematologic/lymphatic: No bruising, lymphadenopathy,or  abnormal clotting Allergy/immunology: No itchy/ watery eyes, abnormal sneezing, rhinitis, urticaria ,or angioedema     Objective:   Physical Exam  Physical Exam  Constitutional: She is oriented to person, place, and time. She appears well-developed and well-nourished. No distress.  HENT:  Head: Normocephalic and atraumatic.  Right Ear: Tympanic membrane and ear canal normal.  Left Ear: Tympanic membrane and ear canal normal.  Mouth/Throat: Oropharynx is clear and moist.  Eyes: Pupils are equal, round, and reactive to light. No scleral  icterus.  Neck: Normal range of motion. No thyromegaly present.  Cardiovascular: Normal rate and regular rhythm.   No murmur heard. Pulmonary/Chest: Effort normal and breath sounds  normal. No respiratory distress. He has no wheezes. She has no rales. She exhibits no tenderness.  Abdominal: Soft. Bowel sounds are normal. She exhibits no distension and no mass. There is no tenderness. There is no rebound and no guarding.  Musculoskeletal: She exhibits no edema.  Lymphadenopathy:    She has no cervical adenopathy.  Neurological: She is alert and oriented to person, place, and time. She has normal patellar reflexes. She exhibits normal muscle tone. Coordination normal.  Skin: Skin is warm and dry.  Psychiatric: She has a normal mood and affect. Her behavior is normal. Judgment and thought content normal.       Assessment & Plan:  1. Anxiety and depression Controlled; continue venlafaxine as directed. Discussed option of counseling and patient will consider this option in the future. Will check labs today.  - CBC with Differential/Platelet - Basic metabolic panel - Hepatic function panel - Lipid panel - TSH  2. Screening for colon cancer Last colonoscopy 2007; 10 year follow up recommended; referral to GI for screening colonoscopy - Ambulatory referral to Gastroenterology  3. Ocular migraine Stable; follow up with neurology as recommended.  4. Osteopenia, unspecified location History of osteopenia; Bone density screening advised - [facility] Bone Density; Future  5. Heel pain, unspecified laterality Heel pain with certain footwear; advised use of supportive footwear and provided referral to podiatry as requested by patient. - Ambulatory referral to Podiatry  6. Tobacco abuse Patient has started smoking 1 ppd for over one year after quitting over 30 years ago. She contributes smoking to stress and will consider cessation but is not interested today. Tobacco Cessation- I have discussed the  risks of tobacco use with the patient, which includes but are not limited to lung, throat,stomach, pancreatic, and colon cancer,as well as COPD. The type and daily amount of tobacco used, as well as the patients willingness to quit were accessed. I discussed with the patient the different medical interventions, their usefulness, effectiveness and side effects. Pt declines medical intervention at this time. Will continue to discuss in further visits and patient encouraged to return if they need assistance with cessation in the future.   Advised patient regarding ShingRx and pneumococcal vaccine. Patient is concerned about cost for immunizations. She has declined immunizations today and stated that she will contact Medicare to determine coverage. Advised her to schedule a nurse visit for pneumococcal vaccine and she can obtain ShingRx at her local pharmacy.  She is overdue for several preventive screenings; she will begin with mammogram, colonoscopy, and bone density at this time.   Advised follow up in 6 months or sooner if needed.  Roddie Mc, FNP-C

## 2016-10-19 NOTE — Patient Instructions (Addendum)
It was a pleasure to see you today.  You will be contact about your referral.  Please let us know if you have not heard back within 1 week about your referral.  Please schedule mammogram at the breast center and bone density screening will be completed at [facility] location.  A referral to [facility] has been placed for your heel pain.  Also, please consider shingles vaccine as discussed which can be obtained at your pharmacy.  A list of psychiatrist providers has been provided as requested. You can refer to this list and schedule an appointment with a location and provider that works best for you.  Follow up in 3 months for evaluation of chronic conditions and consider pneumococcal vaccine.

## 2016-10-23 ENCOUNTER — Other Ambulatory Visit: Payer: Self-pay | Admitting: Internal Medicine

## 2016-10-27 ENCOUNTER — Other Ambulatory Visit: Payer: Self-pay

## 2016-10-27 ENCOUNTER — Telehealth: Payer: Self-pay | Admitting: Family Medicine

## 2016-10-27 MED ORDER — ATENOLOL 25 MG PO TABS: 25.0000 mg | ORAL_TABLET | Freq: Every day | ORAL | 0 refills | Status: DC

## 2016-10-27 NOTE — Telephone Encounter (Signed)
Rx for atenolol was sent to pt pharmacy as requested.

## 2016-10-27 NOTE — Telephone Encounter (Signed)
Pt need new Rx for atenolol  Pharm:  [facility]  [phone]  Pt state that she is out.

## 2016-11-11 ENCOUNTER — Other Ambulatory Visit: Payer: Self-pay | Admitting: Podiatry

## 2016-11-11 ENCOUNTER — Ambulatory Visit (INDEPENDENT_AMBULATORY_CARE_PROVIDER_SITE_OTHER): Payer: Medicare Other

## 2016-11-11 ENCOUNTER — Encounter: Payer: Self-pay | Admitting: Podiatry

## 2016-11-11 ENCOUNTER — Ambulatory Visit (INDEPENDENT_AMBULATORY_CARE_PROVIDER_SITE_OTHER): Payer: Medicare Other | Admitting: Podiatry

## 2016-11-11 VITALS — BP 137/84 | HR 75 | Resp 16

## 2016-11-11 DIAGNOSIS — M779 Enthesopathy, unspecified: Secondary | ICD-10-CM

## 2016-11-11 DIAGNOSIS — M722 Plantar fascial fibromatosis: Secondary | ICD-10-CM

## 2016-11-11 DIAGNOSIS — M7661 Achilles tendinitis, right leg: Secondary | ICD-10-CM

## 2016-11-11 MED ORDER — TRIAMCINOLONE ACETONIDE 10 MG/ML IJ SUSP
10.0000 mg | Freq: Once | INTRAMUSCULAR | Status: AC
  Administered 2016-11-11: 10 mg

## 2016-11-11 NOTE — Progress Notes (Signed)
   Subjective:    Patient ID: Lacey Salas, female    DOB: 1945-07-12, 71 y.o.   MRN: [id number]  HPI Chief Complaint  Patient presents with  . Foot Pain    Right Foot; back of heel; pt stated, "Had an MRI last summer 2017 with Dewaine Conger; was told had bone spur on back of heel"      Review of Systems  Constitutional: Positive for diaphoresis.  Gastrointestinal: Positive for abdominal distention.  Endocrine: Positive for heat intolerance.  Genitourinary: Positive for urgency.  Musculoskeletal: Positive for gait problem and myalgias.  Neurological: Positive for tremors, weakness and headaches.  All other systems reviewed and are negative.      Objective:   Physical Exam        Assessment & Plan:

## 2016-11-11 NOTE — Progress Notes (Signed)
Subjective:    Patient ID: Lacey Salas, female   DOB: 71 y.o.   MRN: [id number]   HPI patient presents stating she has a two-year history of a lot of pain in the back of the right heel and she's had an MRI done and it has been immobilized for an extensive period of time but continues to give her problems    Review of Systems  All other systems reviewed and are negative.       Objective:  Physical Exam  Constitutional: She appears well-developed and well-nourished.  Cardiovascular: Intact distal pulses.   Pulmonary/Chest: Breath sounds normal.  Musculoskeletal: Normal range of motion.  Neurological: She is alert.  Skin: Skin is warm.  Nursing note and vitals reviewed.  neurovascular status found to be intact with muscle strength adequate range of motion within normal limits. Patient's found to have quite a bit of swelling in the posterior aspect of the right heel at the insertional point of the Achilles to the calcaneus itself. It is mostly on the medial side with moderate central involvement and minimal lateral involvement noted     Assessment:   Acute Achilles tendinitis right which is been present for a fairly long time and is failed to respond him mobilization      Plan:    H&P condition discussed at great length. She also has a history of RSD S sale really doesn't really want avoid surgery unless absolutely necessary so at this time I did do a careful injection of the medial side after explaining to her the chances for rupture associated with injection treatment. She will immobilized against the next several weeks and be seen back and will most likely require shockwave and other treatment but hopefully we can avoid surgery on this patient  X-rays indicate large posterior spur formation

## 2016-11-11 NOTE — Patient Instructions (Signed)

## 2016-11-19 ENCOUNTER — Encounter: Payer: Self-pay | Admitting: Family Medicine

## 2016-11-25 ENCOUNTER — Ambulatory Visit (INDEPENDENT_AMBULATORY_CARE_PROVIDER_SITE_OTHER): Payer: Medicare Other | Admitting: Podiatry

## 2016-11-25 ENCOUNTER — Encounter: Payer: Self-pay | Admitting: Podiatry

## 2016-11-25 DIAGNOSIS — M7661 Achilles tendinitis, right leg: Secondary | ICD-10-CM | POA: Diagnosis not present

## 2016-11-25 NOTE — Progress Notes (Signed)
Subjective:    Patient ID: Lacey Salas, female   DOB: 71 y.o.   MRN: [id number]   HPI patient states it seems to be improved but I have had a two-year history of this and I have so much trouble wearing this big boot and the surgical shoe doesn't help me    ROS      Objective:  Physical Exam neurovascular status intact with patient found to have reduced inflammation around the medial side of the Achilles right with long-term history of RSD S and pain in the Achilles tendon     Assessment:   Achilles tendinitis right that's improved with injection but is still present      Plan:   Reviewed condition and at this point I dispensed a short air fracture walker to allow for reduced movement and pressure. I then discussed long-term shockwave therapy and I recommended shockwave therapy to patient and she wants this done and she is scheduled for shockwave therapy at the current time to be done on the medial aspect of the right Achilles tendon. Patient will wear the boot until that time and also was dispensed a silicone ankle sleeve to take pressure off the back of the heel

## 2016-12-14 NOTE — Progress Notes (Signed)
HPI:   [name] is a 71 y.o. female, who is here today to establish care.  Former PCP: Lacey Salas  Last preventive routine visit: 09/2016  Chronic medical problems: Hemiplegic migraine , she follows with Dr Lacey Salas. Anxiety, depression, GERD, HLD, and osteopenia among some.  Migraine for 20+ years.  She has not had hemiplegic episodes for over a year. She has no driving restrictions. She is on Atenolol 25 mg daily, according to pt, started because "fast heart beat."    Hx of anxiety, she takes Lorazepam 0.5 mg as needed, when flying.  Osteopenia: DEXA a few years ago, "long time ago." She has Hx of GERD ,so she did not try Fosamax because she was afraid it would exacerbate GI symptoms.   She does not take Ca++ or Vit D supplementation.  She takes Nexium 20 mg OTC daily, medication helps with heartburn.    Concerns today:     Anxiety and depression: Denies changes in appetite.  She is on Effexor XR 75 mg daily, which was started by her neurologist about 2 years ago to treat migraines. She is c/o "racing mind", "cannot be still", and sweating worse for the past few months. She thinks Effexor is causing these symptoms.  According to pt, her neurologists recommended psychiatry evaluation. Denies abnormal wt loss. + Tremor but she tells me that it has been stable for years. It does not interfere with manual activities.  Sleeps "is ok" in general, sometimes "not as well." She denies suicidal thoughts.  She lives alone but she is socially active, involved in church gatherings.   She is c/o aching "all over" and fatigue worse for the past couple months. Exacerbated by house work and lifting. Many years ago she was Dx with fibromyalgia.  Lab Results  Component Value Date   TSH 1.95 10/19/2016   Lab Results  Component Value Date   CREATININE 0.52 10/19/2016   BUN 11 10/19/2016   NA 142 10/19/2016   K 4.0 10/19/2016   CL 108 10/19/2016   CO2 26  10/19/2016    She is reporting recent episode of shingles, 07/2016, but was over a week when she did seek medical attention, so antiviral med was not indicated. She feels like her fatigue has been worse since then.  Reporting Hx of RSD (reflex sympathetic dystrophy) of RUE, Dx years ago and stable overall.   "Discolored mucus" for month and usually worse in the morning and night. Chest muscle wall pain , exacerbated by deep breathing. She has not had cough or wheezing.   She is also requesting a TB test done, and she is thinking about working part-time.  Review of Systems  Constitutional: Positive for fatigue. Negative for activity change, appetite change and fever.  HENT: Positive for congestion and postnasal drip. Negative for mouth sores, nosebleeds, sinus pain, sore throat and trouble swallowing.   Eyes: Negative for redness and visual disturbance.  Respiratory: Negative for cough, shortness of breath and wheezing.   Cardiovascular: Negative for palpitations and leg swelling.  Gastrointestinal: Negative for abdominal pain, nausea and vomiting.       Negative for changes in bowel habits.  Endocrine: Negative for cold intolerance, heat intolerance, polydipsia, polyphagia and polyuria.  Genitourinary: Negative for decreased urine volume, dysuria and hematuria.  Musculoskeletal: Positive for arthralgias, back pain and myalgias. Negative for gait problem.  Skin: Negative for pallor and rash.  Allergic/Immunologic: Positive for environmental allergies.  Neurological: Positive for tremors and headaches.  Negative for syncope and weakness.  Hematological: Negative for adenopathy. Does not bruise/bleed easily.  Psychiatric/Behavioral: Positive for sleep disturbance. Negative for confusion, hallucinations and suicidal ideas. The patient is nervous/anxious.       Current Outpatient Prescriptions on File Prior to Visit  Medication Sig Dispense Refill  . acetaminophen (TYLENOL) 500 MG  tablet Take 500 mg by mouth as needed.    Marland Kitchen atenolol (TENORMIN) 25 MG tablet Take 1 tablet (25 mg total) by mouth daily. Overdue for annual appt must see MD for refills 90 tablet 0  . esomeprazole (NEXIUM) 20 MG capsule Take 1 capsule (20 mg total) by mouth daily before breakfast. OVC    . LORazepam (ATIVAN) 0.5 MG tablet Take 1 tablet (0.5 mg total) by mouth daily as needed (flying). 5 tablet 0  . venlafaxine XR (EFFEXOR XR) 75 MG 24 hr capsule Take 1 capsule (75 mg total) by mouth daily with breakfast. 90 capsule 1   No current facility-administered medications on file prior to visit.      Past Medical History:  Diagnosis Date  . Anxiety   . Depression   . GERD (gastroesophageal reflux disease)   . History of chicken pox   . Hyperlipidemia   . Migraine    complicated migraines with transient R side facial paralysis and aphasia  . MVP (mitral valve prolapse)   . Osteopenia   . Reflex sympathetic dystrophy, unspecified 2011   R hand related to wrist fx, improved s/p nerve blocks  . Seizures (HCC)   . Stevens-Johnson disease (HCC)    Allergies  Allergen Reactions  . Cephalexin     REACTION: Levonne Spiller  . Epinephrine     REACTION: hypertensive reaction  . Erythromycin     REACTION: Rash  . Lactated Ringers     REACTION: cardiac symptoms  . Nortriptyline     Rapid heart rate  . Penicillins     REACTION: Rash  . Sulfamethoxazole-Trimethoprim     REACTION: Levonne Spiller  . Sulfonamide Derivatives   . Trimethoprim     REACTION: stevens johnson syndrome  . Wellbutrin [Bupropion] Other (See Comments)    tremors    Family History  Problem Relation Age of Onset  . Leukemia Brother        CLL  . Arthritis Mother   . Arthritis Father   . Breast cancer Sister   . Arthritis Other   . Ovarian cancer Other   . Hyperlipidemia Other   . Hypertension Other   . Stroke Other     Social History   Social History  . Marital status: Married    Spouse name: N/A  .  Number of children: N/A  . Years of education: N/A   Social History Main Topics  . Smoking status: Current Every Day Smoker    Packs/day: 1.00    Types: Cigarettes  . Smokeless tobacco: Never Used     Comment: Quit over 30 years ago( just needs it a bit now ) Stress   . Alcohol use No  . Drug use: No  . Sexual activity: No   Other Topics Concern  . None   Social History Narrative  . None    Vitals:   12/15/16 1024  BP: 124/78  Pulse: 79  Resp: 12  SpO2: 96%    Body mass index is 26.28 kg/m.   Physical Exam  Nursing note and vitals reviewed. Constitutional: She is oriented to person, place, and time. She appears well-developed and  well-nourished. No distress.  HENT:  Head: Normocephalic and atraumatic.  Mouth/Throat: Oropharynx is clear and moist and mucous membranes are normal.  Eyes: Pupils are equal, round, and reactive to light. Conjunctivae and EOM are normal.  Neck: No tracheal deviation present. No thyroid mass and no thyromegaly present.  Cardiovascular: Normal rate.  An irregular rhythm present.  No murmur heard. Pulses:      Dorsalis pedis pulses are 2+ on the right side, and 2+ on the left side.  Respiratory: Effort normal and breath sounds normal. No respiratory distress.  GI: Soft. She exhibits no mass. There is no hepatomegaly. There is no tenderness.  Musculoskeletal: She exhibits no edema.       Thoracic back: She exhibits tenderness.       Lumbar back: She exhibits tenderness.  Trigger points on upper , mid, lower back. Also chest wall, upper and lower extremities. UE joints with no significant limitation, shoulder ROM elicits pain. No signs of synovitis.   Lymphadenopathy:    She has no cervical adenopathy.  Neurological: She is alert and oriented to person, place, and time. She has normal strength. Gait normal.  Skin: Skin is warm. No erythema.  Psychiatric: Her mood appears anxious.  Well groomed, good eye contact.    ASSESSMENT AND  PLAN:   Ms. Kathlene was seen today for establish care.  Diagnoses and all orders for this visit:  Lab Results  Component Value Date   WBC 7.6 12/15/2016   HGB 14.0 12/15/2016   HCT 42.2 12/15/2016   MCV 90.5 12/15/2016   PLT 326.0 12/15/2016   Lab Results  Component Value Date   TSH 1.64 12/15/2016   Lab Results  Component Value Date   CREATININE 0.45 12/15/2016   BUN 12 12/15/2016   NA 141 12/15/2016   K 4.2 12/15/2016   CL 107 12/15/2016   CO2 27 12/15/2016   Lab Results  Component Value Date   ESRSEDRATE 25 12/15/2016   Lab Results  Component Value Date   CRP 0.5 12/15/2016    Fatigue, unspecified type  We discussed possible etiologies: Systemic illness, immunologic,endocrinology,sleep disorder, psychiatric/psychologic, infectious,medications side effects, and idiopathic. Examination today does not suggest a serious process. Some of her chronic medical problems can be causing/aggravating symptom: Fibromyalgia,depression among some.  Further recommendations will be given according to lab results.  -     CBC -     TSH -     Basic metabolic panel -     Sedimentation rate -     C-reactive protein  Irregular heart rate  EKG done today: SR,PAC's, normal axis and intervals. Compared with EKG 09/2012 ST-T changes on anterior leads not longer present, rest with no significant changes. Further recommendations will be given according to lab results. Instructed about warning signs.  -     EKG 12-Lead -     CBC -     TSH -     Basic metabolic panel  Myalgia  Most likely related with her fibromyalgia. Other possible etiologies discussed. We discussed treatment options, because she is on Effexor ,Cymbalta is not a choice at this time. She has taken gabapentin in the past and tolerated well. Gabapentin 100 mg to start at bedtime and titrate up to 300 mg. Side effects discussed. F/U in 2 months.  -     Sedimentation rate -     C-reactive protein -      gabapentin (NEURONTIN) 100 MG capsule; Take 3 capsules (300 mg total)  by mouth at bedtime.  Hemiplegic migraine without status migrainosus, not intractable  Problem has improved after Effexor was started, she has not tolerated other treatments. Continue following with [facility].  Anxiety and depression  She denies Hx of bipolar disorder. She does not want to go to psychiatrist. Continue Effexor for now. Gabapentin may help with anxiety. F/U in 2 months.  Need for tuberculosis vaccination -     PPD      Laderrick Wilk G. [place], MD  [facility]. [place] office.

## 2016-12-15 ENCOUNTER — Ambulatory Visit (INDEPENDENT_AMBULATORY_CARE_PROVIDER_SITE_OTHER): Payer: Medicare Other | Admitting: Family Medicine

## 2016-12-15 ENCOUNTER — Encounter: Payer: Self-pay | Admitting: Family Medicine

## 2016-12-15 VITALS — BP 124/78 | HR 79 | Resp 12 | Ht 63.0 in | Wt 148.4 lb

## 2016-12-15 DIAGNOSIS — F329 Major depressive disorder, single episode, unspecified: Secondary | ICD-10-CM

## 2016-12-15 DIAGNOSIS — Z23 Encounter for immunization: Secondary | ICD-10-CM

## 2016-12-15 DIAGNOSIS — F419 Anxiety disorder, unspecified: Secondary | ICD-10-CM | POA: Diagnosis not present

## 2016-12-15 DIAGNOSIS — F32A Depression, unspecified: Secondary | ICD-10-CM

## 2016-12-15 DIAGNOSIS — I499 Cardiac arrhythmia, unspecified: Secondary | ICD-10-CM

## 2016-12-15 DIAGNOSIS — R5383 Other fatigue: Secondary | ICD-10-CM | POA: Diagnosis not present

## 2016-12-15 DIAGNOSIS — M791 Myalgia, unspecified site: Secondary | ICD-10-CM | POA: Diagnosis not present

## 2016-12-15 DIAGNOSIS — G43409 Hemiplegic migraine, not intractable, without status migrainosus: Secondary | ICD-10-CM | POA: Diagnosis not present

## 2016-12-15 LAB — BASIC METABOLIC PANEL
BUN: 12 mg/dL (ref 6–23)
CO2: 27 mEq/L (ref 19–32)
Calcium: 9.5 mg/dL (ref 8.4–10.5)
Chloride: 107 mEq/L (ref 96–112)
Creatinine, Ser: 0.45 mg/dL (ref 0.40–1.20)
GFR: 146.35 mL/min (ref >60.00)
Glucose, Bld: 99 mg/dL (ref 70–99)
POTASSIUM: 4.2 meq/L (ref 3.5–5.1)
SODIUM: 141 meq/L (ref 135–145)

## 2016-12-15 LAB — SEDIMENTATION RATE: SED RATE: 25 mm/h (ref 0–30)

## 2016-12-15 LAB — CBC
HCT: 42.2 % (ref 36.0–46.0)
Hemoglobin: 14 g/dL (ref 12.0–15.0)
MCHC: 33.1 g/dL (ref 30.0–36.0)
MCV: 90.5 fl (ref 78.0–100.0)
Platelets: 326 10*3/uL (ref 150.0–400.0)
RBC: 4.67 Mil/uL (ref 3.87–5.11)
RDW: 13.7 % (ref 11.5–15.5)
WBC: 7.6 10*3/uL (ref 4.0–10.5)

## 2016-12-15 LAB — C-REACTIVE PROTEIN: CRP: 0.5 mg/dL (ref 0.5–20.0)

## 2016-12-15 LAB — TSH: TSH: 1.64 u[IU]/mL (ref 0.35–4.50)

## 2016-12-15 MED ORDER — GABAPENTIN 100 MG PO CAPS: 300.0000 mg | ORAL_CAPSULE | Freq: Every day | ORAL | 2 refills | Status: DC

## 2016-12-15 NOTE — Progress Notes (Signed)
PPD Placement note Lacey Salas, 71 y.o. female is here today for placement of PPD test Reason for PPD test: new job Pt taken PPD test before: yes Verified in allergy area and with patient that they are not allergic to the products PPD is made of (Phenol or Tween). yes Is patient taking any oral or IV steroid medication now or have they taken it in the last month? no Has the patient ever received the BCG vaccine?: no Has the patient been in recent contact with anyone known or suspected of having active TB disease?:no      Date of exposure (if applicable):      Name of person they were exposed to (if applicable):  Patient's Country of origin?:  O: Alert and oriented in NAD. P:  PPD placed on 12/15/2016.  Patient advised to return for reading within 48-72 hours.

## 2016-12-15 NOTE — Patient Instructions (Addendum)
A few things to remember from today's visit:   Irregular heart rate - Plan: EKG 12-Lead, CBC, TSH, Basic metabolic panel  Myalgia - Plan: Sedimentation rate, C-reactive protein  Myofascial Pain Syndrome and Fibromyalgia Myofascial pain syndrome and fibromyalgia are both pain disorders. This pain may be felt mainly in your muscles.  Myofascial pain syndrome: ? Always has trigger points or tender points in the muscle that will cause pain when pressed. The pain may come and go. ? Usually affects your neck, upper back, and shoulder areas. The pain often radiates into your arms and hands.  Fibromyalgia: ? Has muscle pains and tenderness that come and go. ? Is often associated with fatigue and sleep disturbances. ? Has trigger points. ? Tends to be long-lasting (chronic), but is not life-threatening.  Fibromyalgia and myofascial pain are not the same. However, they often occur together. If you have both conditions, each can make the other worse. Both are common and can cause enough pain and fatigue to make day-to-day activities difficult. What are the causes? The exact causes of fibromyalgia and myofascial pain are not known. People with certain gene types may be more likely to develop fibromyalgia. Some factors can be triggers for both conditions, such as:  Spine disorders.  Arthritis.  Severe injury (trauma) and other physical stressors.  Being under a lot of stress.  A medical illness.  What are the signs or symptoms? Fibromyalgia The main symptom of fibromyalgia is widespread pain and tenderness in your muscles. This can vary over time. Pain is sometimes described as stabbing, shooting, or burning. You may have tingling or numbness, too. You may also have sleep problems and fatigue. You may wake up feeling tired and groggy (fibro fog). Other symptoms may include:  Bowel and bladder problems.  Headaches.  Visual problems.  Problems with odors and noises.  Depression or mood  changes.  Painful menstrual periods (dysmenorrhea).  Dry skin or eyes.  Myofascial pain syndrome Symptoms of myofascial pain syndrome include:  Tight, ropy bands of muscle.  Uncomfortable sensations in muscular areas, such as: ? Aching. ? Cramping. ? Burning. ? Numbness. ? Tingling. ? Muscle weakness.  Trouble moving certain muscles freely (range of motion).  How is this diagnosed? There are no specific tests to diagnose fibromyalgia or myofascial pain syndrome. Both can be hard to diagnose because their symptoms are common in many other conditions. Your health care provider may suspect one or both of these conditions based on your symptoms and medical history. Your health care provider will also do a physical exam. The key to diagnosing fibromyalgia is having pain, fatigue, and other symptoms for more than three months that cannot be explained by another condition. The key to diagnosing myofascial pain syndrome is finding trigger points in muscles that are tender and cause pain elsewhere in your body (referred pain). How is this treated? Treating fibromyalgia and myofascial pain often requires a team of health care providers. This usually starts with your primary provider and a physical therapist. You may also find it helpful to work with alternative health care providers, such as massage therapists or acupuncturists. Treatment for fibromyalgia may include medicines. This may include nonsteroidal anti-inflammatory drugs (NSAIDs), along with other medicines. Treatment for myofascial pain may also include:  NSAIDs.  Cooling and stretching of muscles.  Trigger point injections.  Sound wave (ultrasound) treatments to stimulate muscles.  Follow these instructions at home:  Take medicines only as directed by your health care provider.  Exercise as directed  by your health care provider or physical therapist.  Try to avoid stressful situations.  Practice relaxation techniques  to control your stress. You may want to try: ? Biofeedback. ? Visual imagery. ? Hypnosis. ? Muscle relaxation. ? Yoga. ? Meditation.  Talk to your health care provider about alternative treatments, such as acupuncture or massage treatment.  Maintain a healthy lifestyle. This includes eating a healthy diet and getting enough sleep.  Consider joining a support group.  Do not do activities that stress or strain your muscles. That includes repetitive motions and heavy lifting. Where to find more information:  National Fibromyalgia Association: www.fmaware.org  Arthritis Foundation: www.arthritis.org  American Chronic Pain Association: GumSearch.nl Contact a health care provider if:  You have new symptoms.  Your symptoms get worse.  You have side effects from your medicines.  You have trouble sleeping.  Your condition is causing depression or anxiety. This information is not intended to replace advice given to you by your health care provider. Make sure you discuss any questions you have with your health care provider. Document Released: 03/02/2005 Document Revised: 08/08/2015 Document Reviewed: 12/06/2013 Elsevier Interactive Patient Education  2018 [employer].   Please be sure medication list is accurate. If a new problem present, please set up appointment sooner than planned today.

## 2016-12-18 ENCOUNTER — Ambulatory Visit: Payer: Self-pay | Admitting: Neurology

## 2016-12-18 LAB — TB SKIN TEST: TB SKIN TEST: NEGATIVE

## 2017-01-27 ENCOUNTER — Other Ambulatory Visit: Payer: Self-pay | Admitting: Family Medicine

## 2017-01-27 NOTE — Telephone Encounter (Signed)
Dr Jordan pt 

## 2017-02-15 ENCOUNTER — Ambulatory Visit: Payer: Self-pay | Admitting: Family Medicine

## 2017-02-24 ENCOUNTER — Ambulatory Visit: Payer: Medicare Other | Admitting: Family Medicine

## 2017-02-24 ENCOUNTER — Encounter: Payer: Self-pay | Admitting: Family Medicine

## 2017-02-24 VITALS — BP 126/70 | HR 78 | Temp 99.0°F | Resp 16 | Ht 63.0 in | Wt 149.5 lb

## 2017-02-24 DIAGNOSIS — M2550 Pain in unspecified joint: Secondary | ICD-10-CM | POA: Diagnosis not present

## 2017-02-24 DIAGNOSIS — R194 Change in bowel habit: Secondary | ICD-10-CM | POA: Diagnosis not present

## 2017-02-24 DIAGNOSIS — L298 Other pruritus: Secondary | ICD-10-CM

## 2017-02-24 DIAGNOSIS — F329 Major depressive disorder, single episode, unspecified: Secondary | ICD-10-CM

## 2017-02-24 DIAGNOSIS — F32A Depression, unspecified: Secondary | ICD-10-CM

## 2017-02-24 DIAGNOSIS — R5382 Chronic fatigue, unspecified: Secondary | ICD-10-CM

## 2017-02-24 DIAGNOSIS — M797 Fibromyalgia: Secondary | ICD-10-CM | POA: Diagnosis not present

## 2017-02-24 DIAGNOSIS — F419 Anxiety disorder, unspecified: Secondary | ICD-10-CM

## 2017-02-24 LAB — T3, FREE: T3, Free: 3.4 pg/mL (ref 2.3–4.2)

## 2017-02-24 LAB — BASIC METABOLIC PANEL
BUN: 12 mg/dL (ref 6–23)
CHLORIDE: 106 meq/L (ref 96–112)
CO2: 28 meq/L (ref 19–32)
CREATININE: 0.51 mg/dL (ref 0.40–1.20)
Calcium: 9 mg/dL (ref 8.4–10.5)
GFR: 126.6 mL/min (ref >60.00)
Glucose, Bld: 87 mg/dL (ref 70–99)
POTASSIUM: 4.3 meq/L (ref 3.5–5.1)
Sodium: 141 mEq/L (ref 135–145)

## 2017-02-24 LAB — TSH: TSH: 2.32 u[IU]/mL (ref 0.35–4.50)

## 2017-02-24 LAB — VITAMIN D 25 HYDROXY (VIT D DEFICIENCY, FRACTURES): VITD: 20.09 ng/mL — ABNORMAL LOW (ref 30.00–100.00)

## 2017-02-24 LAB — T4, FREE: FREE T4: 0.63 ng/dL (ref 0.60–1.60)

## 2017-02-24 MED ORDER — TRIAMCINOLONE ACETONIDE 0.1 % EX CREA: 1.0000 "application " | TOPICAL_CREAM | Freq: Two times a day (BID) | CUTANEOUS | 0 refills | Status: DC | PRN

## 2017-02-24 NOTE — Patient Instructions (Addendum)
A few things to remember from today's visit:   Change in bowel habit - Plan: Ambulatory referral to Gastroenterology  Polyarthralgia - Plan: Ambulatory referral to Rheumatology  Pruritic erythematous rash - Plan: triamcinolone cream (KENALOG) 0.1 %  No changes today. Tai Chi may help.   Please be sure medication list is accurate. If a new problem present, please set up appointment sooner than planned today.

## 2017-02-24 NOTE — Progress Notes (Signed)
    HPI:   [name] is a 71 y.o. female, who is here today to follow on some chronic medical problems.  She was last seen on December 15, 2016, at that time she was complaining of worsening fatigue and myalgias.  She has history of fibromyalgia, depression, anxiety, tachycardia, and hemiplegic migraine.  Anxiety and depression: She is currently on Effexor XR 75 mg daily, which has also helped to controlled migraines.  She follows with neurologist, planning on discussing other treatment options for migraine, she thinks Effexor is causing "racing mind." Denies suicidal thoughts or changes in sleep.  She has "a tremendous" amount of stress, denies depression.   Lorazepam 0.5 mg daily prn.  Myalgias:  Last visit Gabapentin 100 mg was recommended, she was instructed to titrate that dose to 300 mg as tolerated. She did not take medication, she states that she remembers taking it in the past and she did not tolerate it well. She does not think fibromyalgia is causing her symptoms.  Last office visit blood workup was otherwise negative.  Lab Results  Component Value Date   WBC 7.6 12/15/2016   HGB 14.0 12/15/2016   HCT 42.2 12/15/2016   MCV 90.5 12/15/2016   PLT 326.0 12/15/2016   Lab Results  Component Value Date   CREATININE 0.45 12/15/2016   BUN 12 12/15/2016   NA 141 12/15/2016   K 4.2 12/15/2016   CL 107 12/15/2016   CO2 27 12/15/2016   Lab Results  Component Value Date   TSH 1.64 12/15/2016   Lab Results  Component Value Date   ESRSEDRATE 25 12/15/2016   Lab Results  Component Value Date   CRP 0.5 12/15/2016   Muscle aches, "all over", mainly cervical and upper back area.  States that her "skin hurts"all over.  Also c/o generalized arthralgias,"fever",and pruritic rash. She wonders if she has a rheumatologic disorder that could explain all her symptoms. + Fatigue.  "I do not feel good" Knee pain L>R pain, worse with going up and down  stairs. Shoulders and hands.  Last weeks she started with rash on shins, behind knees, R>L, improved. She has not used specific treatments.  + Stiffness. Hands and feet swollen in the morning,resolves as the day goes on. No joint erythema or limitation of ROM. Pain is aggravated by movement and activities that involve repetition.   Rash on pretibial area (shins), posterior aspect of knees, and some on lower back. No Hx of eczema. She applied OTC moisturizer on affected area and rash seems to be getting better.   She has seen dermatologists in the past and it was a concern about lupus, so she was referred to rheuma then and lab work reported as negative.  Today her temp 99.0 F, which she considers as "fever for me." Intermittently subjective fever for a "few months" worse for the past week or so.  She started working part time and she thinks this is aggravating symptoms.   Also c/o changes in bowel habits.  Soft stools, ntermittent for the past 2-3 weeks.She has had "pure mucus" with bowel movements. She has had similar problem before but usually it does "not last long."  Hx of IBS.  No associated nausea,vomiting, abdominal pain, blood in stool,or melena. Last colonoscopy in 2007.  Last OV noted irregular HR. She tells me that she has Hx of heart murmur and was evaluated by cardiologist due to mitral prolapse. According to pt, she had negative cardiac work-up and specific follow   up was not recommended. Denies chest pain, dyspnea, palpitation, dizzy spells,diaphoresis, focal weakness,orthopnea,or PND.   Review of Systems  Constitutional: Positive for fatigue and fever. Negative for activity change, appetite change, chills and diaphoresis.  HENT: Negative for facial swelling, mouth sores, nosebleeds, sore throat and trouble swallowing.   Eyes: Negative for redness and visual disturbance.  Respiratory: Negative for cough, shortness of breath and wheezing.   Cardiovascular:  Negative for chest pain, palpitations and leg swelling.  Gastrointestinal: Positive for diarrhea. Negative for abdominal pain, blood in stool, nausea and vomiting.       + changes in bowel habits.  Endocrine: Negative for cold intolerance, heat intolerance, polydipsia, polyphagia and polyuria.  Genitourinary: Negative for decreased urine volume, dysuria and hematuria.  Musculoskeletal: Positive for arthralgias, back pain, myalgias and neck pain. Negative for gait problem.  Skin: Positive for rash. Negative for wound.  Neurological: Negative for syncope, weakness and headaches.  Hematological: Negative for adenopathy. Does not bruise/bleed easily.  Psychiatric/Behavioral: Positive for sleep disturbance. Negative for confusion and suicidal ideas. The patient is nervous/anxious.       Current Outpatient Medications on File Prior to Visit  Medication Sig Dispense Refill  . acetaminophen (TYLENOL) 500 MG tablet Take 500 mg by mouth as needed.    . atenolol (TENORMIN) 25 MG tablet TAKE 1 TABLET (25 MG TOTAL) BY MOUTH DAILY. OVERDUE FOR ANNUAL APPT MUST SEE MD FOR REFILLS 90 tablet 0  . esomeprazole (NEXIUM) 20 MG capsule Take 1 capsule (20 mg total) by mouth daily before breakfast. OVC    . LORazepam (ATIVAN) 0.5 MG tablet Take 1 tablet (0.5 mg total) by mouth daily as needed (flying). 5 tablet 0  . venlafaxine XR (EFFEXOR XR) 75 MG 24 hr capsule Take 1 capsule (75 mg total) by mouth daily with breakfast. 90 capsule 1   No current facility-administered medications on file prior to visit.      Past Medical History:  Diagnosis Date  . Anxiety   . Depression   . GERD (gastroesophageal reflux disease)   . History of chicken pox   . Hyperlipidemia   . Migraine    complicated migraines with transient R side facial paralysis and aphasia  . MVP (mitral valve prolapse)   . Osteopenia   . Reflex sympathetic dystrophy, unspecified 2011   R hand related to wrist fx, improved s/p nerve blocks  .  Seizures (HCC)   . Stevens-Johnson disease (HCC)    Allergies  Allergen Reactions  . Cephalexin     REACTION: Stevens Johnson  . Epinephrine     REACTION: hypertensive reaction  . Erythromycin     REACTION: Rash  . Lactated Ringers     REACTION: cardiac symptoms  . Nortriptyline     Rapid heart rate  . Penicillins     REACTION: Rash  . Sulfamethoxazole-Trimethoprim     REACTION: Stevens Johnson  . Sulfonamide Derivatives   . Trimethoprim     REACTION: stevens johnson syndrome  . Wellbutrin [Bupropion] Other (See Comments)    tremors    Social History   Socioeconomic History  . Marital status: Married    Spouse name: None  . Number of children: None  . Years of education: None  . Highest education level: None  Social Needs  . Financial resource strain: None  . Food insecurity - worry: None  . Food insecurity - inability: None  . Transportation needs - medical: None  . Transportation needs - non-medical: None  Occupational   History  . None  Tobacco Use  . Smoking status: Current Every Day Smoker    Packs/day: 1.00    Types: Cigarettes  . Smokeless tobacco: Never Used  . Tobacco comment: Quit over 30 years ago( just needs it a bit now ) Stress   Substance and Sexual Activity  . Alcohol use: No  . Drug use: No  . Sexual activity: No  Other Topics Concern  . None  Social History Narrative  . None    Vitals:   02/24/17 1127  BP: 126/70  Pulse: 78  Resp: 16  Temp: 99 F (37.2 C)  SpO2: 98%   Body mass index is 26.48 kg/m.   Physical Exam  Nursing note and vitals reviewed. Constitutional: She is oriented to person, place, and time. She appears well-developed and well-nourished. No distress.  HENT:  Head: Normocephalic and atraumatic.  Mouth/Throat: Oropharynx is clear and moist and mucous membranes are normal.  Eyes: Conjunctivae are normal. Pupils are equal, round, and reactive to light.  Neck: No JVD present. No thyroid mass and no thyromegaly  present.  Cardiovascular: Normal rate. An irregularly irregular rhythm present.  Occasional extrasystoles are present.  No murmur heard. Pulses:      Dorsalis pedis pulses are 2+ on the right side, and 2+ on the left side.  Respiratory: Effort normal and breath sounds normal. No respiratory distress.  GI: Soft. She exhibits no mass. There is no hepatomegaly. There is no tenderness.  Musculoskeletal: She exhibits tenderness. She exhibits no edema.       Cervical back: She exhibits decreased range of motion and tenderness. She exhibits no bony tenderness.       Thoracic back: She exhibits tenderness. She exhibits no bony tenderness.       Lumbar back: She exhibits tenderness. She exhibits no bony tenderness.  Tender trigger points on cervical paraspinal muscles,trapezium, thoracic and lumbar paraspinal muscles, chest wall, and muscles on upper and lower extremities.  Findings are bilateral, some muscle contraction on paraspinal muscles mainly cervical, left side. No limitation of movement of the shoulder, elbow, wrist, and IP bilateral. Knee crepitus with ROM, R>L. No significant deformities or signs of synovitis.  Lymphadenopathy:    She has no cervical adenopathy.  Neurological: She is alert and oriented to person, place, and time. She has normal strength. Coordination and gait normal.  Skin: Skin is warm. Rash noted. Rash is maculopapular.  Scattered maculopapular rash noted on pretibial area, bilateral. It is not tender, no local heat.  Psychiatric: Her mood appears anxious. She expresses no suicidal ideation.  Well groomed, good eye contact.    ASSESSMENT AND PLAN:   [name] was seen today for follow-up.  Diagnoses and all orders for this visit:  Lab Results  Component Value Date   TSH 2.32 02/24/2017   Lab Results  Component Value Date   CREATININE 0.51 02/24/2017   BUN 12 02/24/2017   NA 141 02/24/2017   K 4.3 02/24/2017   CL 106 02/24/2017   CO2 28 02/24/2017     Change in bowel habit  Could be related to her Hx of IBS. She is due for colonoscopy. Instructed about warning signs. GI referral placed.  -     Ambulatory referral to Gastroenterology  Polyarthralgia  Examination does not suggest inflammatory process. ESR and CRP in normal range. OA and fibromyalgia most likely causing symptoms. Reporting subjective fever,intermittently for a few months. Concerned about rheumatologic process, she would like rheuma evaluation.  -       Ambulatory referral to Rheumatology -     VITAMIN D 25 Hydroxy (Vit-D Deficiency, Fractures)  Pruritic erythematous rash  ? Eczema. Improving. Adequate moisturize. Topical steroid for up to 14 days,some side effects discussed. F/U as needed.   -     triamcinolone cream (KENALOG) 0.1 %; Apply 1 application topically 2 (two) times daily as needed.  Chronic fatigue  We discussed once again possible etiologies: Systemic illness, immunologic,endocrinology,sleep disorder, psychiatric/psychologic, infectious,medications side effects, and idiopathic. In her case I think her Hx of depression,anxiety,and fibromyalgia play an important role. Examination today does not suggest a serious process. So far blood work-up has been negative.  -     Basic metabolic panel -     T4, free -     T3, free -     TSH  Fibromyalgia  I still thinks most of her myalgias and arthralgias are related to this problem. We discussed physiopathology and clinical presentation,treatment options, and prognosis. She does not want to try Gabapentin. Low impact exercise and good sleep hygiene recommended for now.  Anxiety and depression  Denies having symptoms of depression. Anxiety is still present. No changes in Effexor, which she would like to stop medication. We discussed possible effects on her migraine, which has been well controlled since medication was started. She is not interested in psychotherapy/psychiatrists evaluation. No  changes in Lorazepam.     -[name] was advised to return sooner than planned today if new concerns arise.        G. , MD  [facility]. [place] office.             

## 2017-02-26 NOTE — Progress Notes (Signed)
Pt given results of Dr [place]; however she expresses concern about ongoing fever, rash, and irregular heart rate; she wonders who can do something so that she will feel better; she would like to know the status of rheumatology consult and ? Cardiology consults; pt can be contacted at [phone]

## 2017-03-10 ENCOUNTER — Ambulatory Visit: Payer: Medicare Other | Admitting: Neurology

## 2017-03-10 ENCOUNTER — Encounter: Payer: Self-pay | Admitting: Neurology

## 2017-03-10 ENCOUNTER — Telehealth: Payer: Self-pay | Admitting: Family Medicine

## 2017-03-10 VITALS — BP 108/80 | HR 95 | Ht 61.0 in | Wt 142.2 lb

## 2017-03-10 DIAGNOSIS — F419 Anxiety disorder, unspecified: Secondary | ICD-10-CM | POA: Diagnosis not present

## 2017-03-10 DIAGNOSIS — F329 Major depressive disorder, single episode, unspecified: Secondary | ICD-10-CM

## 2017-03-10 DIAGNOSIS — G43409 Hemiplegic migraine, not intractable, without status migrainosus: Secondary | ICD-10-CM

## 2017-03-10 DIAGNOSIS — F32A Depression, unspecified: Secondary | ICD-10-CM

## 2017-03-10 DIAGNOSIS — F17200 Nicotine dependence, unspecified, uncomplicated: Secondary | ICD-10-CM

## 2017-03-10 DIAGNOSIS — G43109 Migraine with aura, not intractable, without status migrainosus: Secondary | ICD-10-CM

## 2017-03-10 DIAGNOSIS — G250 Essential tremor: Secondary | ICD-10-CM

## 2017-03-10 MED ORDER — VENLAFAXINE HCL ER 37.5 MG PO CP24: 37.5000 mg | ORAL_CAPSULE | Freq: Every day | ORAL | 0 refills | Status: DC

## 2017-03-10 NOTE — Patient Instructions (Signed)
1.  We will decrease venlafaxine to 37.5mg  daily.  Stop in one month.  At that time, contact me and we will restart Celexa 2.  Start the new migraine medication, Aimovig. 3.  Follow up in 3 months.

## 2017-03-10 NOTE — Progress Notes (Signed)
NEUROLOGY FOLLOW UP OFFICE NOTE  Lacey Salas [id number]  HISTORY OF PRESENT ILLNESS: Lacey Salas is a 71 year old right-handed woman with history of anxiety, depression, migraine, complex regional pain syndrome of right hand, hyperlipidemia, osteopenia, MVP and Stevens-Johnson syndrome who follows up for complex migraines (hemiplegic migraine and ocular migraine).   UPDATE: No hemiplegic migraines.  Once in a while, ocular migraine.  Venlafaxine helps with anxiety but she says it causes racing thoughts.  Venlafaxine was decreased from 150mg  to 75mg  daily but symptoms still persist. Current NSAIDS:  no Current analgesics:  Tylenol Current triptans:  no Current anti-emetic:  no Current muscle relaxants:  no Current anti-anxiolytic:  lorazepam as needed (rarely takes) Current sleep aide:  no Current Antihypertensive medications:  atenolol 25mg  Current Antidepressant medications:  venlafaxine XR 75mg  Current Anticonvulsant medications:  no Current Vitamins/Herbal/Supplements:  no Current Antihistamines/Decongestants:  no Other therapy:  no  Caffeine:  no Alcohol:  no Smoker:  Quit 34 years ago but started again due to recent tragedy Depression:  Significant depression and anxiety.   SPELLS: She has been evaluated by several neurologists and headache specialists, such as Dr. , Dr. , Dr. , Dr. , and Dr. Avie Echevaria. Occurs spontaneously, including while driving. Onset:  Has history of typical migraines (with headache, nausea and vomiting) since age 42.  This subsided and started to have these current spells starting in her early 72s.  70% of the time it is accompanied by headache. Location:  holocephalic Quality:  Non-throbbing pressure Initial intensity:  5/10 Aura:  no Prodrome:  Indescribable "foreboding" feeling Associated symptoms:  Migraines typically present as TIA-like symptoms, including right-sided weakness involving  face, arm and leg, photophobia, word-finding difficulties, difficulty getting words out, slurred speech, staggering gait.  She is conscious and alert but doesn't feel like she can function during the spell.  Following episodes, she is fatigued and will sleep for 2-3 hours.  On rare occasions, accompanied by field cut. Initial duration:  30 minutes Initial frequency:  Varies.  Usually once a month but may be 3 times a week. Triggers/exacerbating factors:  Chemical smells, some perfumes, bright lights Relieving factors:  Laying down Activity:  Cannot function  MRI/MRA of head from 06/15/08 were unremarkable. EEGs from 11/02/12 and 09/27/13 were normal.   Past abortive therapy:  none Past preventative therapy:  Amitriptyline (initially for complex regional pain syndrome), Sinequan, onriboflavin   Family history of headache:  Maternal grandmother, sister  PAST MEDICAL HISTORY: Past Medical History:  Diagnosis Date  . Anxiety   . Depression   . GERD (gastroesophageal reflux disease)   . History of chicken pox   . Hyperlipidemia   . Migraine    complicated migraines with transient R side facial paralysis and aphasia  . MVP (mitral valve prolapse)   . Osteopenia   . Reflex sympathetic dystrophy, unspecified 2011   R hand related to wrist fx, improved s/p nerve blocks  . Seizures (HCC)   . Stevens-Johnson disease (HCC)     MEDICATIONS: Current Outpatient Medications on File Prior to Visit  Medication Sig Dispense Refill  . acetaminophen (TYLENOL) 500 MG tablet Take 500 mg by mouth as needed.    08/15/08 atenolol (TENORMIN) 25 MG tablet TAKE 1 TABLET (25 MG TOTAL) BY MOUTH DAILY. OVERDUE FOR ANNUAL APPT MUST SEE MD FOR REFILLS 90 tablet 0  . esomeprazole (NEXIUM) 20 MG capsule Take 1 capsule (20 mg total) by mouth daily before breakfast. OVC    .  LORazepam (ATIVAN) 0.5 MG tablet Take 1 tablet (0.5 mg total) by mouth daily as needed (flying). 5 tablet 0  . triamcinolone cream (KENALOG) 0.1 % Apply  1 application topically 2 (two) times daily as needed. 45 g 0   No current facility-administered medications on file prior to visit.     ALLERGIES: Allergies  Allergen Reactions  . Cephalexin     REACTION: Levonne SpillerStevens Johnson  . Epinephrine     REACTION: hypertensive reaction  . Erythromycin     REACTION: Rash  . Lactated Ringers     REACTION: cardiac symptoms  . Nortriptyline     Rapid heart rate  . Penicillins     REACTION: Rash  . Sulfamethoxazole-Trimethoprim     REACTION: Levonne SpillerStevens Johnson  . Sulfonamide Derivatives   . Trimethoprim     REACTION: stevens johnson syndrome  . Wellbutrin [Bupropion] Other (See Comments)    tremors    FAMILY HISTORY: Family History  Problem Relation Age of Onset  . Leukemia Brother        CLL  . Arthritis Mother   . Arthritis Father   . Breast cancer Sister   . Arthritis Other   . Ovarian cancer Other   . Hyperlipidemia Other   . Hypertension Other   . Stroke Other     SOCIAL HISTORY: Social History   Socioeconomic History  . Marital status: Married    Spouse name: Not on file  . Number of children: Not on file  . Years of education: Not on file  . Highest education level: Not on file  Social Needs  . Financial resource strain: Not on file  . Food insecurity - worry: Not on file  . Food insecurity - inability: Not on file  . Transportation needs - medical: Not on file  . Transportation needs - non-medical: Not on file  Occupational History  . Not on file  Tobacco Use  . Smoking status: Current Every Day Smoker    Packs/day: 1.00    Types: Cigarettes  . Smokeless tobacco: Never Used  . Tobacco comment: Quit over 30 years ago( just needs it a bit now ) Stress   Substance and Sexual Activity  . Alcohol use: No  . Drug use: No  . Sexual activity: No  Other Topics Concern  . Not on file  Social History Narrative  . Not on file    REVIEW OF SYSTEMS: Constitutional: No fevers, chills, or sweats, no generalized  fatigue, change in appetite Eyes: No visual changes, double vision, eye pain Ear, nose and throat: No hearing loss, ear pain, nasal congestion, sore throat Cardiovascular: No chest pain, palpitations Respiratory:  No shortness of breath at rest or with exertion, wheezes GastrointestinaI: No nausea, vomiting, diarrhea, abdominal pain, fecal incontinence Genitourinary:  No dysuria, urinary retention or frequency Musculoskeletal:  No neck pain, back pain Integumentary: No rash, pruritus, skin lesions Neurological: as above Psychiatric: depression, anxiety Endocrine: No palpitations, fatigue, diaphoresis, mood swings, change in appetite, change in weight, increased thirst Hematologic/Lymphatic:  No purpura, petechiae. Allergic/Immunologic: no itchy/runny eyes, nasal congestion, recent allergic reactions, rashes  PHYSICAL EXAM: Vitals:   03/10/17 0908  BP: 136/84  Pulse: 90  SpO2: 98%   General: No acute distress.  Patient appears well-groomed.  normal body habitus. Head:  Normocephalic/atraumatic Eyes:  Fundi examined but not visualized Neck: supple, no paraspinal tenderness, full range of motion Heart:  Regular rate and rhythm Lungs:  Clear to auscultation bilaterally Back: No paraspinal tenderness Neurological  Exam: alert and oriented to person, place, and time. Attention span and concentration intact, recent and remote memory intact, fund of knowledge intact.  Speech fluent and not dysarthric, language intact.  CN II-XII intact. Bulk and tone normal, muscle strength 5/5 throughout.  Sensation to light touch  intact.  Deep tendon reflexes 2+ throughout.  Finger to nose testing intact.  Gait normal  IMPRESSION: Hemiplegic and ocular migraines stable Depression and anxiety.  She reports racing thoughts since starting Effexor. Tobacco use disorder.  PLAN: 1.  We will transition from venlafaxine back to citalopram.  She will take venlafaxine 37.5mg  daily for 4 weeks.  At that point,  she will stop venlafaxine and we will restart citalopram.  I wouldn't increase atenolol since it may exacerbate depression and we are already reducing the antidepressant. 2.  For preventative, we will start Aimovig 3.  Smoking cessation instruction/counseling given:  counseled patient on the dangers of tobacco use, advised patient to stop smoking, and reviewed strategies to maximize success. 4.  Follow up in 3 months  25 minutes spent face to face with patient, over 50% spent discussing management.  Shon Millet, DO  CC:  Betty Swazil[place], MD

## 2017-03-10 NOTE — Telephone Encounter (Signed)
Copied from CRM [id number]. Topic: Referral - Status >> Mar 10, 2017 10:05 AM Everardo Pacific, NT wrote: Reason for CRM: Patient calling to check the status of her getting a referral to see a rheumatologist. If someone could give her a call back about this at [phone] or [phone]

## 2017-03-15 NOTE — Telephone Encounter (Signed)
Left message letting patient know that after looking in referrals that it looked like it was denied and that she may need to call her insurance company to find out why and that I would ask Dr. Sw[place]il[place] to resend referral for rheumatology.

## 2017-03-15 NOTE — Telephone Encounter (Signed)
Left message for patient to give clinic a call back. 

## 2017-03-15 NOTE — Telephone Encounter (Signed)
Copied from CRM [id number]. Topic: Quick Communication - Office Called Patient >> Mar 15, 2017 10:02 AM Stephannie Li, NT wrote: Reason for CRM Patient  returning call from office please call again , [phone]  no message was left

## 2017-03-22 ENCOUNTER — Ambulatory Visit: Payer: Medicare Other | Admitting: Family Medicine

## 2017-03-22 ENCOUNTER — Encounter: Payer: Self-pay | Admitting: Family Medicine

## 2017-03-22 VITALS — BP 130/77 | HR 83 | Temp 98.4°F | Resp 12 | Ht 61.0 in | Wt 152.0 lb

## 2017-03-22 DIAGNOSIS — K625 Hemorrhage of anus and rectum: Secondary | ICD-10-CM

## 2017-03-22 DIAGNOSIS — K5900 Constipation, unspecified: Secondary | ICD-10-CM | POA: Diagnosis not present

## 2017-03-22 DIAGNOSIS — K645 Perianal venous thrombosis: Secondary | ICD-10-CM | POA: Diagnosis not present

## 2017-03-22 MED ORDER — HYDROCORTISONE 2.5 % RE CREA: 1.0000 "application " | TOPICAL_CREAM | Freq: Two times a day (BID) | RECTAL | 0 refills | Status: AC

## 2017-03-22 NOTE — Progress Notes (Signed)
ACUTE VISIT   HPI:  Chief Complaint  Patient presents with  . Cyst    near anus, notices 1 week ago    [name] is a 72 y.o. female, who is here today complaining of constant non tender "cyst" perianally,"dime size." She first noted lesion last week, no noted changes in size. Has not identified exacerbating or alleviating factors.  She denies changes in bowel habits,nausea,vomiting,or dyschezia.  Minimal hematochezia occasionally, noted after defecation and on toilet paper. No history of rectal trauma.   + Constipation. Last bowel movement last night.  Last colonoscopy 05/2005.  Already referred to GI to repeat colonoscopy but she missed call.   She has not called back to schedule appointment.   She has tried hot compresses. Has not used OTC medication.  She has not had fever or chills. No changes in chronic fatigue, arthralgias, or myalgias. Last office visit she was referred to rheumatology, due to insurance issues appointment has not been arranged.  Review of fibromyalgia, anxiety, and depression.   Review of Systems  Constitutional: Positive for fatigue. Negative for activity change, appetite change and fever.  Respiratory: Negative for shortness of breath and wheezing.   Cardiovascular: Negative for leg swelling.  Gastrointestinal: Positive for anal bleeding and constipation. Negative for abdominal distention, abdominal pain, blood in stool, nausea and vomiting.  Endocrine: Negative for cold intolerance and heat intolerance.  Genitourinary: Negative for dysuria, hematuria, pelvic pain, vaginal bleeding and vaginal discharge.  Musculoskeletal: Positive for arthralgias and myalgias. Negative for gait problem.       Hx of fibromyalgia.  Skin: Negative for pallor and rash.  Neurological: Positive for headaches (Hx of migraines).  Hematological: Negative for adenopathy. Does not bruise/bleed easily.  Psychiatric/Behavioral: Negative for confusion. The  patient is nervous/anxious.       Current Outpatient Medications on File Prior to Visit  Medication Sig Dispense Refill  . acetaminophen (TYLENOL) 500 MG tablet Take 500 mg by mouth as needed.    Marland Kitchen atenolol (TENORMIN) 25 MG tablet TAKE 1 TABLET (25 MG TOTAL) BY MOUTH DAILY. OVERDUE FOR ANNUAL APPT MUST SEE MD FOR REFILLS 90 tablet 0  . esomeprazole (NEXIUM) 20 MG capsule Take 1 capsule (20 mg total) by mouth daily before breakfast. OVC    . LORazepam (ATIVAN) 0.5 MG tablet Take 1 tablet (0.5 mg total) by mouth daily as needed (flying). 5 tablet 0  . triamcinolone cream (KENALOG) 0.1 % Apply 1 application topically 2 (two) times daily as needed. 45 g 0  . venlafaxine XR (EFFEXOR XR) 37.5 MG 24 hr capsule Take 1 capsule (37.5 mg total) by mouth daily with breakfast. 30 capsule 0   No current facility-administered medications on file prior to visit.      Past Medical History:  Diagnosis Date  . Anxiety   . Depression   . GERD (gastroesophageal reflux disease)   . History of chicken pox   . Hyperlipidemia   . Migraine    complicated migraines with transient R side facial paralysis and aphasia  . MVP (mitral valve prolapse)   . Osteopenia   . Reflex sympathetic dystrophy, unspecified 2011   R hand related to wrist fx, improved s/p nerve blocks  . Seizures (HCC)   . Stevens-Johnson disease (HCC)    Allergies  Allergen Reactions  . Cephalexin     REACTION: Levonne Spiller  . Epinephrine     REACTION: hypertensive reaction  . Erythromycin     REACTION: Rash  .  Lactated Ringers     REACTION: cardiac symptoms  . Nortriptyline     Rapid heart rate  . Penicillins     REACTION: Rash  . Sulfamethoxazole-Trimethoprim     REACTION: Levonne Spiller  . Sulfonamide Derivatives   . Trimethoprim     REACTION: stevens johnson syndrome  . Wellbutrin [Bupropion] Other (See Comments)    tremors    Social History   Socioeconomic History  . Marital status: Married    Spouse name:  None  . Number of children: None  . Years of education: None  . Highest education level: None  Social Needs  . Financial resource strain: None  . Food insecurity - worry: None  . Food insecurity - inability: None  . Transportation needs - medical: None  . Transportation needs - non-medical: None  Occupational History  . None  Tobacco Use  . Smoking status: Current Every Day Smoker    Packs/day: 1.00    Types: Cigarettes  . Smokeless tobacco: Never Used  . Tobacco comment: Quit over 30 years ago( just needs it a bit now ) Stress   Substance and Sexual Activity  . Alcohol use: No  . Drug use: No  . Sexual activity: No  Other Topics Concern  . None  Social History Narrative  . None    Vitals:   03/22/17 0758  BP: 130/77  Pulse: 83  Resp: 12  Temp: 98.4 F (36.9 C)  SpO2: 97%   Body mass index is 28.72 kg/m.    Physical Exam  Nursing note and vitals reviewed. Constitutional: She is oriented to person, place, and time. She appears well-developed. She does not appear ill. No distress.  HENT:  Head: Normocephalic and atraumatic.  Mouth/Throat: Oropharynx is clear and moist and mucous membranes are normal.  Eyes: Conjunctivae are normal.  Cardiovascular: Normal rate and regular rhythm.  No murmur heard. Respiratory: Effort normal and breath sounds normal. No respiratory distress.  GI: Soft. Bowel sounds are normal. She exhibits no distension and no mass. There is no hepatomegaly. There is no tenderness.  Genitourinary: Rectal exam shows external hemorrhoid and mass. Rectal exam shows no fissure, no tenderness and guaiac negative stool.  Genitourinary Comments: At 9 O'clock there is a 3 cm nodular lesion.Hard and not tender  Musculoskeletal: She exhibits no edema.  Lymphadenopathy:    She has no cervical adenopathy.  Neurological: She is alert and oriented to person, place, and time. She has normal strength. Gait normal.  Skin: Skin is warm. No rash noted. No  erythema.  Psychiatric: Her mood appears anxious.  Well groomed, good eye contact.    ASSESSMENT AND PLAN:   [name] was seen today for cyst.  Diagnoses and all orders for this visit:  Rectal bleed  Today with guaiac test negative. We discussed possible etiologies, most likely related to hemorrhoids. Strongly recommend rescheduling appointment with GI.  Constipation, unspecified constipation type  Increase fiber and fluid intake. OTC MiraLAX might also help. Instructed about warning signs.  External hemorrhoid, thrombosed  Created about diagnosis. Topical Hydrocortisone recommended. Sitz bath a few times per day will also help. Instructed about warning signs.  -     hydrocortisone (ANUSOL-HC) 2.5 % rectal cream; Place 1 application rectally 2 (two) times daily for 7 days.       -[name] was advised to seek immediate medical attention if sudden worsening symptoms or to follow if they persist or if new concerns arise.  Lacey Salas G. Martin[place], MD  [facility]. [place] office.

## 2017-03-22 NOTE — Patient Instructions (Addendum)
A few things to remember from today's visit:   Rectal bleed  Constipation, unspecified constipation type  External hemorrhoid, thrombosed - Plan: hydrocortisone (ANUSOL-HC) 2.5 % rectal cream   Please schedule appointment for colonoscopy.   Hemorrhoids Hemorrhoids are swollen veins in and around the rectum or anus. There are two types of hemorrhoids:  Internal hemorrhoids. These occur in the veins that are just inside the rectum. They may poke through to the outside and become irritated and painful.  External hemorrhoids. These occur in the veins that are outside of the anus and can be felt as a painful swelling or hard lump near the anus.  Most hemorrhoids do not cause serious problems, and they can be managed with home treatments such as diet and lifestyle changes. If home treatments do not help your symptoms, procedures can be done to shrink or remove the hemorrhoids. What are the causes? This condition is caused by increased pressure in the anal area. This pressure may result from various things, including:  Constipation.  Straining to have a bowel movement.  Diarrhea.  Pregnancy.  Obesity.  Sitting for long periods of time.  Heavy lifting or other activity that causes you to strain.  Anal sex.  What are the signs or symptoms? Symptoms of this condition include:  Pain.  Anal itching or irritation.  Rectal bleeding.  Leakage of stool (feces).  Anal swelling.  One or more lumps around the anus.  How is this diagnosed? This condition can often be diagnosed through a visual exam. Other exams or tests may also be done, such as:  Examination of the rectal area with a gloved hand (digital rectal exam).  Examination of the anal canal using a small tube (anoscope).  A blood test, if you have lost a significant amount of blood.  A test to look inside the colon (sigmoidoscopy or colonoscopy).  How is this treated? This condition can usually be treated at  home. However, various procedures may be done if dietary changes, lifestyle changes, and other home treatments do not help your symptoms. These procedures can help make the hemorrhoids smaller or remove them completely. Some of these procedures involve surgery, and others do not. Common procedures include:  Rubber band ligation. Rubber bands are placed at the base of the hemorrhoids to cut off the blood supply to them.  Sclerotherapy. Medicine is injected into the hemorrhoids to shrink them.  Infrared coagulation. A type of light energy is used to get rid of the hemorrhoids.  Hemorrhoidectomy surgery. The hemorrhoids are surgically removed, and the veins that supply them are tied off.  Stapled hemorrhoidopexy surgery. A circular stapling device is used to remove the hemorrhoids and use staples to cut off the blood supply to them.  Follow these instructions at home: Eating and drinking  Eat foods that have a lot of fiber in them, such as whole grains, beans, nuts, fruits, and vegetables. Ask your health care provider about taking products that have added fiber (fiber supplements).  Drink enough fluid to keep your urine clear or pale yellow. Managing pain and swelling  Take warm sitz baths for 20 minutes, 3-4 times a day to ease pain and discomfort.  If directed, apply ice to the affected area. Using ice packs between sitz baths may be helpful. ? Put ice in a plastic bag. ? Place a towel between your skin and the bag. ? Leave the ice on for 20 minutes, 2-3 times a day. General instructions  Take over-the-counter and prescription  medicines only as told by your health care provider.  Use medicated creams or suppositories as told.  Exercise regularly.  Go to the bathroom when you have the urge to have a bowel movement. Do not wait.  Avoid straining to have bowel movements.  Keep the anal area dry and clean. Use wet toilet paper or moist towelettes after a bowel movement.  Do not  sit on the toilet for long periods of time. This increases blood pooling and pain. Contact a health care provider if:  You have increasing pain and swelling that are not controlled by treatment or medicine.  You have uncontrolled bleeding.  You have difficulty having a bowel movement, or you are unable to have a bowel movement.  You have pain or inflammation outside the area of the hemorrhoids. This information is not intended to replace advice given to you by your health care provider. Make sure you discuss any questions you have with your health care provider. Document Released: 02/28/2000 Document Revised: 07/31/2015 Document Reviewed: 11/14/2014 Elsevier Interactive Patient Education  2018 [employer].   Please be sure medication list is accurate. If a new problem present, please set up appointment sooner than planned today.

## 2017-04-09 ENCOUNTER — Telehealth: Payer: Self-pay | Admitting: Neurology

## 2017-04-09 ENCOUNTER — Other Ambulatory Visit: Payer: Self-pay | Admitting: Neurology

## 2017-04-09 NOTE — Telephone Encounter (Signed)
Called Pt, LM on VM 

## 2017-04-09 NOTE — Telephone Encounter (Signed)
She called regarding the medication Venlafaxine XR was denied. She also mentioned an advertisement she had seen that was to help with migraines and she was interested in it. Please Call. Thanks

## 2017-04-12 NOTE — Telephone Encounter (Signed)
Called and spoke with Pt, she has titrated off the velafaxin and is ready to start the citolpram. She is reluctant to try Aimovig, states she has had problems with meddications in the past and she is apprehensive about an injection that lasts for one month. Also, she has not had any headaches, even with coming off the venlafaxin. Will check with Dr Everlena Cooper about citalopram dose to send in

## 2017-04-13 MED ORDER — CITALOPRAM HYDROBROMIDE 40 MG PO TABS: 40.0000 mg | ORAL_TABLET | Freq: Every day | ORAL | 3 refills | Status: DC

## 2017-04-13 NOTE — Addendum Note (Signed)
Addended by: Dorthy Cooler on: 04/13/2017 01:35 PM   Modules accepted: Orders

## 2017-04-13 NOTE — Telephone Encounter (Signed)
I believe she was previously on citalopram 40mg  daily.  She may start with 20mg  daily for 1 week, then increase to 40mg  daily.  If headaches have been controlled, we can hold off on starting a preventative for now.

## 2017-04-13 NOTE — Telephone Encounter (Signed)
Called Pt, LM on VM advising [facility] will be calling to let her know the Rx is ready. Advsd to take 20 mg x1 wk, then 40 mg QD. Called [facility] spoke with Zella Ball, gave verbal. Pt will be getting 40mg  tabs, to take 1/2 tab x1 week, then 1 tab

## 2017-04-29 ENCOUNTER — Encounter: Payer: Self-pay | Admitting: Family Medicine

## 2017-05-02 ENCOUNTER — Other Ambulatory Visit: Payer: Self-pay | Admitting: Family Medicine

## 2017-05-03 ENCOUNTER — Other Ambulatory Visit: Payer: Self-pay | Admitting: Family Medicine

## 2017-05-04 NOTE — Telephone Encounter (Signed)
Copied from CRM [id number]. Topic: Quick Communication - See Telephone Encounter >> May 04, 2017 10:48 AM Lacey Salas, Rosey Bath D wrote: CRM for notification. See Telephone encounter for: 05/04/17. Patient called and wants to know why she can not get her medication Atenolol 25 MG , she had a CPE back on 10/19/16 with NP Roddie Mc. She also wants to talk about her referral appt with rheumatology. She called her insurance and they said they will cover it. Please call patient back, thanks.

## 2017-05-05 NOTE — Telephone Encounter (Signed)
I placed referral to rheuma in 02/2017. It seems like Atenolol was already sent.  Thanks, BJ

## 2017-05-06 ENCOUNTER — Telehealth: Payer: Self-pay | Admitting: Family Medicine

## 2017-05-06 NOTE — Telephone Encounter (Signed)
Patient informed that Rx was sent to pharmacy and a referral would be placed.

## 2017-05-06 NOTE — Telephone Encounter (Signed)
Pt called about messages left per Fausto Skillern about rheumatology  Referral; conference call initiated with Fausto Skillern, and the pt says her insurance will cover it now; she would like for her referral to be put back in

## 2017-05-06 NOTE — Telephone Encounter (Signed)
Left message for patient to give clinic a call back. 

## 2017-05-12 ENCOUNTER — Other Ambulatory Visit: Payer: Self-pay | Admitting: Neurology

## 2017-05-12 ENCOUNTER — Encounter: Payer: Self-pay | Admitting: Family Medicine

## 2017-05-12 ENCOUNTER — Ambulatory Visit: Payer: Self-pay

## 2017-05-12 ENCOUNTER — Ambulatory Visit (INDEPENDENT_AMBULATORY_CARE_PROVIDER_SITE_OTHER)
Admission: RE | Admit: 2017-05-12 | Discharge: 2017-05-12 | Disposition: A | Discharge status: Home / Self Care | Payer: Medicare Other | Source: Ambulatory Visit | Attending: Family Medicine | Admitting: Family Medicine

## 2017-05-12 ENCOUNTER — Ambulatory Visit: Payer: Medicare Other | Admitting: Family Medicine

## 2017-05-12 VITALS — BP 134/80 | HR 74 | Temp 98.1°F | Resp 12 | Ht 61.0 in | Wt 150.4 lb

## 2017-05-12 DIAGNOSIS — M79604 Pain in right leg: Secondary | ICD-10-CM

## 2017-05-12 DIAGNOSIS — Y92009 Unspecified place in unspecified non-institutional (private) residence as the place of occurrence of the external cause: Secondary | ICD-10-CM

## 2017-05-12 DIAGNOSIS — M25551 Pain in right hip: Secondary | ICD-10-CM

## 2017-05-12 DIAGNOSIS — S79911A Unspecified injury of right hip, initial encounter: Secondary | ICD-10-CM | POA: Diagnosis not present

## 2017-05-12 DIAGNOSIS — W19XXXA Unspecified fall, initial encounter: Secondary | ICD-10-CM | POA: Diagnosis not present

## 2017-05-12 DIAGNOSIS — S3992XA Unspecified injury of lower back, initial encounter: Secondary | ICD-10-CM | POA: Diagnosis not present

## 2017-05-12 MED ORDER — TIZANIDINE HCL 2 MG PO TABS: 2.0000 mg | ORAL_TABLET | Freq: Three times a day (TID) | ORAL | 0 refills | Status: DC | PRN

## 2017-05-12 MED ORDER — CITALOPRAM HYDROBROMIDE 40 MG PO TABS: 40.0000 mg | ORAL_TABLET | Freq: Every day | ORAL | 0 refills | Status: DC

## 2017-05-12 MED ORDER — PREDNISONE 20 MG PO TABS: ORAL_TABLET | ORAL | 0 refills | Status: AC

## 2017-05-12 NOTE — Progress Notes (Signed)
ACUTE VISIT   HPI:  Chief Complaint  Patient presents with  . Back Pain    lower right-sided, radiates to lower abdomen, thigh and knee. Fell 3 weeks ago.    [name] is a 72 y.o. female, who is here today complaining of right lower back pain radiated to right groin and anterior aspect of right thigh. No numbness or tingling, denies saddle anesthesia. No urine or bowel incontinence.   About 3 weeks ago she felt. She was trying to get on a chair, so she can reach a basket; chair did slide and she felt on her back and hit her head. After a few minutes she was able to get up,deneis LOC. She had headache and neck pain for a few days and resolved. She also has mid and low back pain that was getting better until yesterday when she lifted and moved a table, aggravated lower back pain. Right lower back pain radiated to groin and anterior aspect of right thigh. Constant achy and intermittent sharp excruciated pain.  Pain is exacerbated  by movement, walking, going up and down stairs. Alleviated by rest.  No associated fever,chills,skin rash, or extremity edema.  She is taking Tylenol.  Hx of fibromyalgia.   Review of Systems  Constitutional: Positive for fatigue. Negative for activity change, appetite change, fever and unexpected weight change.  Respiratory: Negative for cough, shortness of breath and wheezing.   Cardiovascular: Negative for leg swelling.  Gastrointestinal: Negative for abdominal pain, blood in stool, nausea and vomiting.       Negative for changes in bowel habits.  Genitourinary: Negative for decreased urine volume, dysuria and hematuria.  Musculoskeletal: Positive for arthralgias, back pain and gait problem. Negative for joint swelling and neck pain.  Skin: Negative for color change and rash.  Neurological: Negative for weakness, numbness and headaches.  Psychiatric/Behavioral: Negative for confusion. The patient is nervous/anxious.       Current  Outpatient Medications on File Prior to Visit  Medication Sig Dispense Refill  . acetaminophen (TYLENOL) 500 MG tablet Take 500 mg by mouth as needed.    Marland Kitchen atenolol (TENORMIN) 25 MG tablet TAKE 1 TABLET BY MOUTH DAILY. OVERDUE FOR ANNUAL APPT MUST SEE MD FOR REFILLS 90 tablet 0  . esomeprazole (NEXIUM) 20 MG capsule Take 1 capsule (20 mg total) by mouth daily before breakfast. OVC    . LORazepam (ATIVAN) 0.5 MG tablet Take 1 tablet (0.5 mg total) by mouth daily as needed (flying). 5 tablet 0  . triamcinolone cream (KENALOG) 0.1 % Apply 1 application topically 2 (two) times daily as needed. 45 g 0  . venlafaxine XR (EFFEXOR XR) 37.5 MG 24 hr capsule Take 1 capsule (37.5 mg total) by mouth daily with breakfast. (Patient not taking: Reported on 05/12/2017) 30 capsule 0   No current facility-administered medications on file prior to visit.      Past Medical History:  Diagnosis Date  . Anxiety   . Depression   . GERD (gastroesophageal reflux disease)   . History of chicken pox   . Hyperlipidemia   . Migraine    complicated migraines with transient R side facial paralysis and aphasia  . MVP (mitral valve prolapse)   . Osteopenia   . Reflex sympathetic dystrophy, unspecified 2011   R hand related to wrist fx, improved s/p nerve blocks  . Seizures (HCC)   . Stevens-Johnson disease (HCC)    Allergies  Allergen Reactions  . Cephalexin  REACTION: Levonne Spiller  . Epinephrine     REACTION: hypertensive reaction  . Erythromycin     REACTION: Rash  . Lactated Ringers     REACTION: cardiac symptoms  . Nortriptyline     Rapid heart rate  . Penicillins     REACTION: Rash  . Sulfamethoxazole-Trimethoprim     REACTION: Levonne Spiller  . Sulfonamide Derivatives   . Trimethoprim     REACTION: stevens johnson syndrome  . Wellbutrin [Bupropion] Other (See Comments)    tremors    Social History   Socioeconomic History  . Marital status: Married    Spouse name: None  . Number of  children: None  . Years of education: None  . Highest education level: None  Social Needs  . Financial resource strain: None  . Food insecurity - worry: None  . Food insecurity - inability: None  . Transportation needs - medical: None  . Transportation needs - non-medical: None  Occupational History  . None  Tobacco Use  . Smoking status: Current Every Day Smoker    Packs/day: 1.00    Types: Cigarettes  . Smokeless tobacco: Never Used  . Tobacco comment: Quit over 30 years ago( just needs it a bit now ) Stress   Substance and Sexual Activity  . Alcohol use: No  . Drug use: No  . Sexual activity: No  Other Topics Concern  . None  Social History Narrative  . None    Vitals:   05/12/17 1433  BP: 134/80  Pulse: 74  Resp: 12  Temp: 98.1 F (36.7 C)  SpO2: 97%   Body mass index is 28.41 kg/m.   Physical Exam  Nursing note and vitals reviewed. Constitutional: She is oriented to person, place, and time. She appears well-developed. She does not appear ill. No distress.  HENT:  Head: Normocephalic and atraumatic.  Eyes: Conjunctivae are normal.  Cardiovascular: Normal rate. An irregular rhythm present.  Occasional extrasystoles (chronic) are present.  No murmur heard. Respiratory: Effort normal and breath sounds normal. No respiratory distress.  GI: Soft. She exhibits no mass. There is no hepatomegaly. There is no tenderness.  Musculoskeletal: She exhibits no edema.       Right hip: She exhibits decreased range of motion. She exhibits normal strength and no tenderness.       Cervical back: She exhibits decreased range of motion (Mild, mainly left rotation. No pain elicited.). She exhibits no tenderness and no bony tenderness.       Thoracic back: She exhibits no tenderness and no bony tenderness.       Lumbar back: She exhibits no tenderness and no bony tenderness.  No significant deformity appreciated. No tenderness upon palpation of paraspinal muscles. Pain elicited  with movement on exam table during examination. No local edema or erythema appreciated, no suspicious lesions.  Right hip flexion elicits pain. No tenderness upon palpation of right hip.    Neurological: She is alert and oriented to person, place, and time. She has normal strength. Coordination normal.  Reflex Scores:      Patellar reflexes are 2+ on the right side and 2+ on the left side. SLR negative bilateral. Antalgic gait with no assistance.  Skin: Skin is warm. No rash noted. No erythema.  Psychiatric: Her mood appears anxious.  Well groomed, good eye contact.      ASSESSMENT AND PLAN:   [name] was seen today for back pain.  Diagnoses and all orders for this visit:  Fall at  home, initial encounter  Fall precautions discussed. She is not interested in PT for now.  -     DG Lumbar Spine Complete; Future -     DG HIP UNILAT WITH PELVIS 2-3 VIEWS RIGHT; Future  Injury of back, initial encounter  Side effects of Zanaflex discussed. Local ice may help. Instructed about warning signs. Further recommendations will be given according to imaging results.  -     DG Lumbar Spine Complete; Future -     tiZANidine (ZANAFLEX) 2 MG tablet; Take 1 tablet (2 mg total) by mouth every 8 (eight) hours as needed for muscle spasms.  Hip pain, acute, right  Examination does not suggest serious injury. Further recommendations will be given according to imaging results.   -     DG HIP UNILAT WITH PELVIS 2-3 VIEWS RIGHT; Future  Leg pain, anterior, right  ? Radicular pain. After discussion of side effects of Prednisone, including risk of fractures, she agrees with trying it. Instructed about warning signs.  -     predniSONE (DELTASONE) 20 MG tablet; 3 tabs for 2 days, 2 tabs for 3 days, 1 tabs for 3 days, and 1/2 tab for 3 days. Take tables together with breakfast. -     tiZANidine (ZANAFLEX) 2 MG tablet; Take 1 tablet (2 mg total) by mouth every 8 (eight) hours as needed for  muscle spasms.     -[name] was advised to seek immediate medical attention if sudden worsening symptoms or to follow if they persist or if new concerns arise.       Caleesi Kohl G. [place], MD  [facility]. [place] office.

## 2017-05-12 NOTE — Telephone Encounter (Signed)
Patient called in with c/o "back pain." She says "I fell about 2-3 weeks ago and hit my head and back. I've been taking Tylenol and doing fine. Yesterday, I lifted up a chair that was not heavy. Last night I noticed I was having more pain in my lower back, around to my right hip, around my lower abdomen, down my right leg to my knee. It's so bad I can barely walk without pain, I can't lay down on my right side, and if I sit wrong. When I try to get up to stand or walk, the pain is unbearable. I've still been taking the Tylenol, but the pain is so bad that it's not touching it." I asked about numbness/weakness to the legs, she said "I haven't noticed numbness or weakness, it's just too painful to walk." I asked is she having urinary problems with the abdominal pain, she denies. According to protocol, see PCP within 4 hours, appointment made for today at 1430 with Dr. [place], care advice given, patient verbalized understanding.  Reason for Disposition . [1] SEVERE pain (e.g., excruciating) AND [2] not improved 2 hours after pain medicine/ice packs  Answer Assessment - Initial Assessment Questions 1. MECHANISM: "How did the injury happen?" (Consider the possibility of domestic violence or elder abuse)     2-3 weeks ago fell, tylenol was used. Yesterday picked up chair that wasn't that heavy. 2. ONSET: "When did the injury happen?" (Minutes or hours ago)     Yesterday 3. LOCATION: "What part of the back is injured?"     Lower back 4. SEVERITY: "Can you move the back normally?"     Yes, but if sit down the wrong way or walk around it's painful and hard to walk around room to room. 5. PAIN: "Is there any pain?" If so, ask: "How bad is the pain?"   (Scale 1-10; or mild, moderate, severe)     Yes up to 10 when stand or try to walk. 6. CORD SYMPTOMS: Any weakness or numbness of the arms or legs?"     Not noticed, just painful 7. SIZE: For cuts, bruises, or swelling, ask: "How large is it?" (e.g., inches or  centimeters)     N/A 8. TETANUS: For any breaks in the skin, ask: "When was the last tetanus booster?"     N/A 9. OTHER SYMPTOMS: "Do you have any other symptoms?" (e.g., abdominal pain, blood in urine)     Lower abdominal pain, no problems with urination 10. PREGNANCY: "Is there any chance you are pregnant?" "When was your last menstrual period?"       N/A  Protocols used: BACK INJURY-A-AH

## 2017-05-12 NOTE — Patient Instructions (Addendum)
A few things to remember from today's visit:   Injury of back, initial encounter - Plan: DG Lumbar Spine Complete, tiZANidine (ZANAFLEX) 2 MG tablet  Hip pain, acute, right - Plan: DG HIP UNILAT WITH PELVIS 2-3 VIEWS RIGHT  Fall at home, initial encounter - Plan: DG Lumbar Spine Complete, DG HIP UNILAT WITH PELVIS 2-3 VIEWS RIGHT  Leg pain, anterior, right - Plan: predniSONE (DELTASONE) 20 MG tablet, tiZANidine (ZANAFLEX) 2 MG tablet  Prednisone to take with food, fall precautions,medication can increase risk for fractures. Today X ray was ordered.  This can be done at [facility] between 8 am and 5 pm: [address] [place]. [phone].   Fall Prevention in the Home Falls can cause injuries. They can happen to people of all ages. There are many things you can do to make your home safe and to help prevent falls. What can I do on the outside of my home?  Regularly fix the edges of walkways and driveways and fix any cracks.  Remove anything that might make you trip as you walk through a door, such as a raised step or threshold.  Trim any bushes or trees on the path to your home.  Use bright outdoor lighting.  Clear any walking paths of anything that might make someone trip, such as rocks or tools.  Regularly check to see if handrails are loose or broken. Make sure that both sides of any steps have handrails.  Any raised decks and porches should have guardrails on the edges.  Have any leaves, snow, or ice cleared regularly.  Use sand or salt on walking paths during winter.  Clean up any spills in your garage right away. This includes oil or grease spills. What can I do in the bathroom?  Use night lights.  Install grab bars by the toilet and in the tub and shower. Do not use towel bars as grab bars.  Use non-skid mats or decals in the tub or shower.  If you need to sit down in the shower, use a plastic, non-slip stool.  Keep the floor dry. Clean  up any water that spills on the floor as soon as it happens.  Remove soap buildup in the tub or shower regularly.  Attach bath mats securely with double-sided non-slip rug tape.  Do not have throw rugs and other things on the floor that can make you trip. What can I do in the bedroom?  Use night lights.  Make sure that you have a light by your bed that is easy to reach.  Do not use any sheets or blankets that are too big for your bed. They should not hang down onto the floor.  Have a firm chair that has side arms. You can use this for support while you get dressed.  Do not have throw rugs and other things on the floor that can make you trip. What can I do in the kitchen?  Clean up any spills right away.  Avoid walking on wet floors.  Keep items that you use a lot in easy-to-reach places.  If you need to reach something above you, use a strong step stool that has a grab bar.  Keep electrical cords out of the way.  Do not use floor polish or wax that makes floors slippery. If you must use wax, use non-skid floor wax.  Do not have throw rugs and other things on the floor that can make you trip. What can  I do with my stairs?  Do not leave any items on the stairs.  Make sure that there are handrails on both sides of the stairs and use them. Fix handrails that are broken or loose. Make sure that handrails are as long as the stairways.  Check any carpeting to make sure that it is firmly attached to the stairs. Fix any carpet that is loose or worn.  Avoid having throw rugs at the top or bottom of the stairs. If you do have throw rugs, attach them to the floor with carpet tape.  Make sure that you have a light switch at the top of the stairs and the bottom of the stairs. If you do not have them, ask someone to add them for you. What else can I do to help prevent falls?  Wear shoes that: ? Do not have high heels. ? Have rubber bottoms. ? Are comfortable and fit you well. ? Are  closed at the toe. Do not wear sandals.  If you use a stepladder: ? Make sure that it is fully opened. Do not climb a closed stepladder. ? Make sure that both sides of the stepladder are locked into place. ? Ask someone to hold it for you, if possible.  Clearly mark and make sure that you can see: ? Any grab bars or handrails. ? First and last steps. ? Where the edge of each step is.  Use tools that help you move around (mobility aids) if they are needed. These include: ? Canes. ? Walkers. ? Scooters. ? Crutches.  Turn on the lights when you go into a dark area. Replace any light bulbs as soon as they burn out.  Set up your furniture so you have a clear path. Avoid moving your furniture around.  If any of your floors are uneven, fix them.  If there are any pets around you, be aware of where they are.  Review your medicines with your doctor. Some medicines can make you feel dizzy. This can increase your chance of falling. Ask your doctor what other things that you can do to help prevent falls. This information is not intended to replace advice given to you by your health care provider. Make sure you discuss any questions you have with your health care provider. Document Released: 12/27/2008 Document Revised: 08/08/2015 Document Reviewed: 04/06/2014 Elsevier Interactive Patient Education  [employer].   Please be sure medication list is accurate. If a new problem present, please set up appointment sooner than planned today.

## 2017-05-14 ENCOUNTER — Telehealth: Payer: Self-pay | Admitting: *Deleted

## 2017-05-14 NOTE — Telephone Encounter (Signed)
Patient informed of lab result and verbalized understanding.

## 2017-05-14 NOTE — Telephone Encounter (Signed)
Copied from CRM [id number]. Topic: Quick Communication - Other Results >> May 14, 2017  8:30 AM Percival Spanish wrote:  Pt would like call back about her XRAY results, pt said she is not getting any better and would like a call back   [phone]

## 2017-05-17 DIAGNOSIS — M545 Low back pain: Secondary | ICD-10-CM | POA: Diagnosis not present

## 2017-05-17 DIAGNOSIS — M25561 Pain in right knee: Secondary | ICD-10-CM | POA: Diagnosis not present

## 2017-05-21 ENCOUNTER — Emergency Department (HOSPITAL_COMMUNITY): Payer: Medicare Other

## 2017-05-21 ENCOUNTER — Encounter (HOSPITAL_COMMUNITY): Payer: Self-pay

## 2017-05-21 ENCOUNTER — Emergency Department (HOSPITAL_COMMUNITY)
Admission: EM | Admit: 2017-05-21 | Discharge: 2017-05-21 | Disposition: A | Discharge status: Home / Self Care | Payer: Medicare Other | Attending: Emergency Medicine | Admitting: Emergency Medicine

## 2017-05-21 ENCOUNTER — Other Ambulatory Visit: Payer: Self-pay

## 2017-05-21 DIAGNOSIS — Z79899 Other long term (current) drug therapy: Secondary | ICD-10-CM | POA: Insufficient documentation

## 2017-05-21 DIAGNOSIS — K2970 Gastritis, unspecified, without bleeding: Secondary | ICD-10-CM | POA: Diagnosis not present

## 2017-05-21 DIAGNOSIS — K529 Noninfective gastroenteritis and colitis, unspecified: Secondary | ICD-10-CM | POA: Insufficient documentation

## 2017-05-21 DIAGNOSIS — R1084 Generalized abdominal pain: Secondary | ICD-10-CM | POA: Diagnosis not present

## 2017-05-21 DIAGNOSIS — R112 Nausea with vomiting, unspecified: Secondary | ICD-10-CM | POA: Diagnosis not present

## 2017-05-21 DIAGNOSIS — M549 Dorsalgia, unspecified: Secondary | ICD-10-CM | POA: Diagnosis not present

## 2017-05-21 DIAGNOSIS — R10816 Epigastric abdominal tenderness: Secondary | ICD-10-CM | POA: Diagnosis not present

## 2017-05-21 DIAGNOSIS — R10814 Left lower quadrant abdominal tenderness: Secondary | ICD-10-CM | POA: Diagnosis not present

## 2017-05-21 DIAGNOSIS — S3992XA Unspecified injury of lower back, initial encounter: Secondary | ICD-10-CM | POA: Diagnosis not present

## 2017-05-21 DIAGNOSIS — F17210 Nicotine dependence, cigarettes, uncomplicated: Secondary | ICD-10-CM | POA: Diagnosis not present

## 2017-05-21 DIAGNOSIS — W19XXXA Unspecified fall, initial encounter: Secondary | ICD-10-CM

## 2017-05-21 DIAGNOSIS — R109 Unspecified abdominal pain: Secondary | ICD-10-CM | POA: Diagnosis not present

## 2017-05-21 DIAGNOSIS — R197 Diarrhea, unspecified: Secondary | ICD-10-CM | POA: Diagnosis present

## 2017-05-21 HISTORY — DX: Migraine with aura, not intractable, without status migrainosus: G43.109

## 2017-05-21 LAB — CBC WITH DIFFERENTIAL/PLATELET
BASOS PCT: 0 %
Basophils Absolute: 0 10*3/uL (ref 0.0–0.1)
Eosinophils Absolute: 0.1 10*3/uL (ref 0.0–0.7)
Eosinophils Relative: 1 %
HEMATOCRIT: 45.4 % (ref 36.0–46.0)
HEMOGLOBIN: 15.1 g/dL — AB (ref 12.0–15.0)
Lymphocytes Relative: 14 %
Lymphs Abs: 3 10*3/uL (ref 0.7–4.0)
MCH: 31.1 pg (ref 26.0–34.0)
MCHC: 33.3 g/dL (ref 30.0–36.0)
MCV: 93.6 fL (ref 78.0–100.0)
MONO ABS: 2.1 10*3/uL — AB (ref 0.1–1.0)
MONOS PCT: 9 %
NEUTROS ABS: 17.2 10*3/uL — AB (ref 1.7–7.7)
Neutrophils Relative %: 76 %
Platelets: 305 10*3/uL (ref 150–400)
RBC: 4.85 MIL/uL (ref 3.87–5.11)
RDW: 14.4 % (ref 11.5–15.5)
WBC: 22.4 10*3/uL — ABNORMAL HIGH (ref 4.0–10.5)

## 2017-05-21 LAB — URINALYSIS, ROUTINE W REFLEX MICROSCOPIC
BILIRUBIN URINE: NEGATIVE
Glucose, UA: NEGATIVE mg/dL
Hgb urine dipstick: NEGATIVE
KETONES UR: NEGATIVE mg/dL
LEUKOCYTES UA: NEGATIVE
NITRITE: NEGATIVE
PH: 6 (ref 5.0–8.0)
PROTEIN: NEGATIVE mg/dL
Specific Gravity, Urine: >1.046 — ABNORMAL HIGH (ref 1.005–1.030)

## 2017-05-21 LAB — COMPREHENSIVE METABOLIC PANEL
ALBUMIN: 4 g/dL (ref 3.5–5.0)
ALK PHOS: 92 U/L (ref 38–126)
ALT: 21 U/L (ref 14–54)
ANION GAP: 11 (ref 5–15)
AST: 23 U/L (ref 15–41)
BILIRUBIN TOTAL: 0.6 mg/dL (ref 0.3–1.2)
BUN: 23 mg/dL — ABNORMAL HIGH (ref 6–20)
CALCIUM: 9.1 mg/dL (ref 8.9–10.3)
CO2: 25 mmol/L (ref 22–32)
Chloride: 108 mmol/L (ref 101–111)
Creatinine, Ser: 0.71 mg/dL (ref 0.44–1.00)
GFR calc Af Amer: >60 mL/min (ref >60)
GFR calc non Af Amer: >60 mL/min (ref >60)
Glucose, Bld: 116 mg/dL — ABNORMAL HIGH (ref 65–99)
POTASSIUM: 3.2 mmol/L — AB (ref 3.5–5.1)
Sodium: 144 mmol/L (ref 135–145)
TOTAL PROTEIN: 7.5 g/dL (ref 6.5–8.1)

## 2017-05-21 LAB — LIPASE, BLOOD: LIPASE: 24 U/L (ref 11–51)

## 2017-05-21 MED ORDER — IOPAMIDOL (ISOVUE-300) INJECTION 61%
INTRAVENOUS | Status: AC
  Filled 2017-05-21: qty 100

## 2017-05-21 MED ORDER — IOPAMIDOL (ISOVUE-300) INJECTION 61%
100.0000 mL | Freq: Once | INTRAVENOUS | Status: AC | PRN
  Administered 2017-05-21: 100 mL via INTRAVENOUS

## 2017-05-21 MED ORDER — LORAZEPAM 2 MG/ML IJ SOLN
0.5000 mg | Freq: Once | INTRAMUSCULAR | Status: AC
  Administered 2017-05-21: 0.5 mg via INTRAVENOUS
  Filled 2017-05-21: qty 1

## 2017-05-21 MED ORDER — SODIUM CHLORIDE 0.9 % IJ SOLN
INTRAMUSCULAR | Status: AC
  Filled 2017-05-21: qty 50

## 2017-05-21 MED ORDER — DICYCLOMINE HCL 20 MG PO TABS: 20.0000 mg | ORAL_TABLET | Freq: Two times a day (BID) | ORAL | 0 refills | Status: DC

## 2017-05-21 MED ORDER — SODIUM CHLORIDE 0.9 % IV BOLUS (SEPSIS)
1000.0000 mL | Freq: Once | INTRAVENOUS | Status: AC
  Administered 2017-05-21: 1000 mL via INTRAVENOUS

## 2017-05-21 MED ORDER — ONDANSETRON HCL 4 MG PO TABS: 4.0000 mg | ORAL_TABLET | Freq: Four times a day (QID) | ORAL | 0 refills | Status: DC

## 2017-05-21 MED ORDER — FAMOTIDINE IN NACL 20-0.9 MG/50ML-% IV SOLN
20.0000 mg | Freq: Once | INTRAVENOUS | Status: AC
  Administered 2017-05-21: 20 mg via INTRAVENOUS
  Filled 2017-05-21: qty 50

## 2017-05-21 NOTE — ED Provider Notes (Signed)
[facility]-EMERGENCY DEPT Provider Note   CSN: [id number] Arrival date & time: 05/21/17  0415     History   Chief Complaint Chief Complaint  Patient presents with  . N/V/D    HPI Lacey Salas is a 73 y.o. female.  72 year old female with a history of reflux, MVP, Stevens-Johnson syndrome, dyslipidemia, complex migraines presents to the emergency department for evaluation of nausea, vomiting, diarrhea.  Symptoms began this evening, waking the patient from sleep.  She has had approximately 5 episodes of vomiting all of which have been nonbloody and nonbilious.  This has been associated with multiple episodes of watery, nonbloody diarrhea.  She reports generalized abdominal discomfort associated with her symptoms.  She was given Zofran by EMS.  Patient complaining of increased fatigue and generalized weakness.  She is concerned that her symptoms may be due to a course of prednisone which was started for suspected radicular pain after a fall 1.5 months ago.  She has a history of negative outpatient x-rays.  No associated fevers or known sick contacts.  Abdominal surgical hx significant for abdominal hysterectomy.   The history is provided by the patient. No language interpreter was used.    Past Medical History:  Diagnosis Date  . Anxiety   . Depression   . GERD (gastroesophageal reflux disease)   . History of chicken pox   . Hyperlipidemia   . Migraine    complicated migraines with transient R side facial paralysis and aphasia  . Migraine headache with aura    hemiplegic migraine  . MVP (mitral valve prolapse)   . Osteopenia   . Reflex sympathetic dystrophy, unspecified 2011   R hand related to wrist fx, improved s/p nerve blocks  . Seizures (HCC)   . Stevens-Johnson disease [facility])     Patient Active Problem List   Diagnosis Date Noted  . Polyarthralgia 02/24/2017  . Fibromyalgia 02/24/2017  . RSD upper limb 09/02/2015  . Hemiplegic migraine without  status migrainosus, not intractable 12/05/2013  . HYPERSOMNIA 04/04/2010  . HYPERLIPIDEMIA 10/12/2006  . Anxiety and depression 10/11/2006  . Adjustment disorder with mixed anxiety and depressed mood 10/11/2006  . GERD 10/11/2006  . OSTEOPENIA 10/11/2006  . STEVENS-JOHNSON SYNDROME, HX OF 10/11/2006    Past Surgical History:  Procedure Laterality Date  . ABDOMINAL HYSTERECTOMY     BSO  . CATARACT EXTRACTION    . HEEL SPUR SURGERY  12/1996   right  . ORIF DISTAL RADIUS FRACTURE  2004    OB History    No data available       Home Medications    Prior to Admission medications   Medication Sig Start Date End Date Taking? Authorizing Provider  aspirin EC 81 MG tablet Take 81 mg by mouth every 4 (four) hours as needed for mild pain.   Yes [provider]  atenolol (TENORMIN) 25 MG tablet TAKE 1 TABLET BY MOUTH DAILY. OVERDUE FOR ANNUAL APPT MUST SEE MD FOR REFILLS 05/04/17  Yes [place], Betty G, MD  citalopram (CELEXA) 40 MG tablet Take 1 tablet (40 mg total) by mouth daily. 05/12/17  Yes Everlena Cooper, Adam R, DO  esomeprazole (NEXIUM) 20 MG capsule Take 1 capsule (20 mg total) by mouth daily before breakfast. OVC 09/22/12  Yes Thermon Leyland, NP  LORazepam (ATIVAN) 0.5 MG tablet Take 1 tablet (0.5 mg total) by mouth daily as needed (flying). Patient not taking: Reported on 05/21/2017 09/02/15   Myrlene Broker, MD  tiZANidine (ZANAFLEX) 2  MG tablet Take 1 tablet (2 mg total) by mouth every 8 (eight) hours as needed for muscle spasms. Patient not taking: Reported on 05/21/2017 05/12/17   [place], Betty G, MD  triamcinolone cream (KENALOG) 0.1 % Apply 1 application topically 2 (two) times daily as needed. Patient not taking: Reported on 05/21/2017 02/24/17   [place], Betty G, MD  venlafaxine XR (EFFEXOR XR) 37.5 MG 24 hr capsule Take 1 capsule (37.5 mg total) by mouth daily with breakfast. Patient not taking: Reported on 05/12/2017 03/10/17   Drema Dallas, DO    Family History Family  History  Problem Relation Age of Onset  . Leukemia Brother        CLL  . Arthritis Mother   . Arthritis Father   . Breast cancer Sister   . Arthritis Other   . Ovarian cancer Other   . Hyperlipidemia Other   . Hypertension Other   . Stroke Other     Social History Social History   Tobacco Use  . Smoking status: Current Every Day Smoker    Packs/day: 1.00    Types: Cigarettes  . Smokeless tobacco: Never Used  . Tobacco comment: Quit over 30 years ago( just needs it a bit now ) Stress   Substance Use Topics  . Alcohol use: No  . Drug use: No     Allergies   Cephalexin; Epinephrine; Erythromycin; Lactated ringers; Nortriptyline; Penicillins; Sulfamethoxazole-trimethoprim; Sulfonamide derivatives; Trimethoprim; and Wellbutrin [bupropion]   Review of Systems Review of Systems Ten systems reviewed and are negative for acute change, except as noted in the HPI.    Physical Exam Updated Vital Signs BP (!) 145/82   Pulse 87   Temp 98.2 F (36.8 C) (Oral)   Resp 19   Ht 5' 3.5" (1.613 m)   Wt 66.7 kg (147 lb)   SpO2 93%   BMI 25.63 kg/m   Physical Exam  Constitutional: She is oriented to person, place, and time. She appears well-developed and well-nourished. No distress.  Nontoxic appearing and in no acute distress  HENT:  Head: Normocephalic and atraumatic.  Dry mucous membranes  Eyes: Conjunctivae and EOM are normal. No scleral icterus.  Neck: Normal range of motion.  Cardiovascular: Normal rate and intact distal pulses.  Pulmonary/Chest: Effort normal. No stridor. No respiratory distress. She has no wheezes.  Respirations even and unlabored  Abdominal: Soft. She exhibits no mass. There is tenderness.  Generalized tenderness to palpation, worse in the epigastrium and left lower quadrant.  No masses or peritoneal signs.  Musculoskeletal: Normal range of motion.  Neurological: She is alert and oriented to person, place, and time. She exhibits normal muscle tone.  Coordination normal.  Skin: Skin is warm and dry. No rash noted. She is not diaphoretic. No erythema. No pallor.  Psychiatric: She has a normal mood and affect. Her behavior is normal.  Nursing note and vitals reviewed.    ED Treatments / Results  Labs (all labs ordered are listed, but only abnormal results are displayed) Labs Reviewed  CBC WITH DIFFERENTIAL/PLATELET - Abnormal; Notable for the following components:      Result Value   WBC 22.4 (*)    Hemoglobin 15.1 (*)    Neutro Abs 17.2 (*)    Monocytes Absolute 2.1 (*)    All other components within normal limits  COMPREHENSIVE METABOLIC PANEL - Abnormal; Notable for the following components:   Potassium 3.2 (*)    Glucose, Bld 116 (*)    BUN 23 (*)  All other components within normal limits  LIPASE, BLOOD  URINALYSIS, ROUTINE W REFLEX MICROSCOPIC    EKG  EKG Interpretation None       Radiology No results found.  Procedures Procedures (including critical care time)  Medications Ordered in ED Medications  iopamidol (ISOVUE-300) 61 % injection 100 mL (not administered)  iopamidol (ISOVUE-300) 61 % injection (not administered)  sodium chloride 0.9 % injection (not administered)  sodium chloride 0.9 % bolus 1,000 mL (1,000 mLs Intravenous New Bag/Given 05/21/17 0511)  LORazepam (ATIVAN) injection 0.5 mg (0.5 mg Intravenous Given 05/21/17 0511)  famotidine (PEPCID) IVPB 20 mg premix (0 mg Intravenous Stopped 05/21/17 0542)     Initial Impression / Assessment and Plan / ED Course  I have reviewed the triage vital signs and the nursing notes.  Pertinent labs & imaging results that were available during my care of the patient were reviewed by me and considered in my medical decision making (see chart for details).     72 year old female presents to the emergency department for evaluation of vomiting and diarrhea which began tonight.  Abdominal exam is reassuring, but given age will obtain CT scan.  She does have a  leukocytosis which may be secondary to infection; however, she is completing a course of prednisone which may also account for this leukocytosis.  She is on this medication after radicular symptoms following a fall.  She has had prior x-rays of her low back.  Will obtain no charge lumbar spine CT films given current need for abdominal CT.  Patient signed out to Glenford Bayley, PA-C at shift change who will follow up on imaging and disposition appropriately.   Final Clinical Impressions(s) / ED Diagnoses   Final diagnoses:  Fall  Gastroenteritis    ED Discharge Orders    None       Antony Madura, PA-C 05/21/17 0636    Paula Libra, MD 05/21/17 [phone]

## 2017-05-21 NOTE — ED Notes (Signed)
400 mL of urine w/ in/out cath

## 2017-05-21 NOTE — ED Notes (Signed)
Bed: WA17 Expected date:  Expected time:  Means of arrival:  Comments: EMS 

## 2017-05-21 NOTE — ED Provider Notes (Signed)
Signout from [place], [place] at shift  See previous providers note for full H&P   Briefly, patient reporting abdominal pain, nausea, vomiting, diarrhea that began yesterday evening waking the patient from sleep.  She is currently on prednisone for radicular pain following a fall that occurred 1.5 months ago.  She has had negative outpatient x-rays.  CT abdomen pelvis pending. Suspect gastroenteritis. If negative, discharge home with antiemetic and Bentyl.  CT abdomen pelvis shows no acute findings, cholelithiasis without evidence of cholecystitis, and benign-appearing renal cyst.  CT of the L-spine is negative for fracture, with lumbar degenerative changes and moderate spinal stenosis at L4-5.  Patient is feeling better regarding her abdominal pain nausea.  Will discharge home with Zofran and Bentyl.  Patient advised to follow-up with her PCP as well as continue seeing return precautions discussed.  Patient also noted to have oxygen saturations around 91% while she was sleeping, which return to normal when she awoke.  I advised patient to talk to her doctor about the possibility of sleep apnea and sleep study may be beneficial.  Patient understands and agrees with plan.  Patient vitals stable and discharged in satisfactory condition.  Her orthopedic doctor for further management following her fall.   Emi Holes, PA-C 05/21/17 [phone]

## 2017-05-21 NOTE — ED Triage Notes (Signed)
Patient arrives by [place]. Patient called EMS for complaints of sharp abdominal pain associated with nausea, vomiting and diarrhea. Patient states she has been weaning herself off prednisone that she has been taking for back and knee pain. Patient also has a hx of complex migraines. EMS states while evaluating patient, stared off-some tremors-lasted ~2 minutes-tremors to left arm,stuttering speech for 5 minutes. Patient told EMS this is her normal at times. Patient lives by herself. EMS administered Zofran 4 mg IV. Patient has chronic low back pain.

## 2017-05-21 NOTE — ED Notes (Signed)
Patient resting comfortably/awaiting CT exam.

## 2017-05-21 NOTE — Discharge Instructions (Signed)
Medications: Zofran, Bentyl  Treatment: Take Zofran every 6 hours as needed for nausea or vomiting.  Take Bentyl as prescribed as needed for abdominal cramping.  Follow-up: Please see your doctor for recheck and follow-up today's visit.  You may want to mention the possibility of sleep apnea, as your oxygen saturations did drop while you are sleeping here today.

## 2017-05-25 ENCOUNTER — Ambulatory Visit: Payer: Medicare Other | Admitting: Family Medicine

## 2017-05-25 ENCOUNTER — Encounter: Payer: Self-pay | Admitting: Family Medicine

## 2017-05-25 VITALS — BP 125/82 | HR 74 | Temp 98.3°F | Resp 12 | Ht 63.5 in

## 2017-05-25 DIAGNOSIS — M5416 Radiculopathy, lumbar region: Secondary | ICD-10-CM | POA: Diagnosis not present

## 2017-05-25 DIAGNOSIS — R509 Fever, unspecified: Secondary | ICD-10-CM

## 2017-05-25 DIAGNOSIS — R300 Dysuria: Secondary | ICD-10-CM | POA: Diagnosis not present

## 2017-05-25 DIAGNOSIS — R3129 Other microscopic hematuria: Secondary | ICD-10-CM

## 2017-05-25 DIAGNOSIS — M545 Low back pain: Secondary | ICD-10-CM | POA: Diagnosis not present

## 2017-05-25 LAB — POCT URINALYSIS DIPSTICK
Bilirubin, UA: NEGATIVE
GLUCOSE UA: NEGATIVE
KETONES UA: NEGATIVE
LEUKOCYTES UA: NEGATIVE
Nitrite, UA: NEGATIVE
Odor: NEGATIVE
Protein, UA: NEGATIVE
SPEC GRAV UA: 1.01 (ref 1.010–1.025)
Urobilinogen, UA: 0.2 E.U./dL
pH, UA: 6 (ref 5.0–8.0)

## 2017-05-25 LAB — POC INFLUENZA A&B (BINAX/QUICKVUE)
INFLUENZA A, POC: NEGATIVE
Influenza B, POC: NEGATIVE

## 2017-05-25 NOTE — Patient Instructions (Addendum)
A few things to remember from today's visit:   Dysuria - Plan: POC Urinalysis Dipstick, Urine culture  Fever, unspecified - Plan: POC Influenza A&B (Binax test)  Other microscopic hematuria - Plan: Urine culture, Urine Microscopic Only    Adequate fluid intake, avoid holding urine for long hours, and over the counter Vit C OR cranberry capsules might help.   Seek immediate medical attention if severe abdominal pain, vomiting, fever/chills, or worsening symptoms. Monitor for new symptoms.  We will follow urine culture.  Please be sure medication list is accurate. If a new problem present, please set up appointment sooner than planned today.

## 2017-05-25 NOTE — Progress Notes (Signed)
HPI:  Chief Complaint  Patient presents with  . Dysuria    started yesterday  . Fever    99.8 earlier today    [name] is a 72 y.o. female, who is here today complaining of urinary symptoms since yesterday.  5 days ago she called 911 because sudden onset of "bad cramping", nausea, vomiting, and diarrhea.  She was taken to the ER, where she received IVF and Zofran.  Symptoms resolved 3 to 4 days ago. Last bowel movement was yesterday, formed.  Dysuria: Burning sensation upon urination yesterday. Urinary frequency: Yes, small amount of urine at the time. Urinary urgency: Yes Incontinence: Occasional urine leakage. Gross hematuria: Denies  Abdominal pain: Resolved.   Nausea or vomiting: Resolved.  Abnormal vaginal bleeding or discharge: Denies  LMP: Postmenopausal Sexual activity: No currently  Hx of UTI: No for the past 4-5 years.  Last night she also noted chills and this morning temperature was 99.8 F.  No known sick contact. She denies respiratory symptoms. No recent travel.  OTC medications for this problem: She took Tylenol last night.  History of fibromyalgia, lower back pain, and knee pain.  All these stable. Mildly worsening fatigue.  Review of Systems  Constitutional: Positive for activity change, fatigue and fever. Negative for appetite change.  HENT: Negative for congestion, mouth sores, rhinorrhea and sore throat.   Respiratory: Negative for cough, shortness of breath and wheezing.   Cardiovascular: Negative for leg swelling.  Gastrointestinal: Negative for abdominal pain, diarrhea, nausea and vomiting.  Genitourinary: Positive for dysuria, frequency and urgency. Negative for decreased urine volume, hematuria, vaginal bleeding and vaginal discharge.  Musculoskeletal: Positive for arthralgias, back pain and gait problem.  Skin: Negative for rash.  Neurological: Negative for syncope and weakness.  Hematological: Negative for adenopathy.  Does not bruise/bleed easily.  Psychiatric/Behavioral: Negative for confusion. The patient is nervous/anxious.       Current Outpatient Medications on File Prior to Visit  Medication Sig Dispense Refill  . aspirin EC 81 MG tablet Take 81 mg by mouth every 4 (four) hours as needed for mild pain.    Marland Kitchen atenolol (TENORMIN) 25 MG tablet TAKE 1 TABLET BY MOUTH DAILY. OVERDUE FOR ANNUAL APPT MUST SEE MD FOR REFILLS 90 tablet 0  . citalopram (CELEXA) 40 MG tablet Take 1 tablet (40 mg total) by mouth daily. 90 tablet 0  . dicyclomine (BENTYL) 20 MG tablet Take 1 tablet (20 mg total) by mouth 2 (two) times daily. 12 tablet 0  . esomeprazole (NEXIUM) 20 MG capsule Take 1 capsule (20 mg total) by mouth daily before breakfast. OVC    . LORazepam (ATIVAN) 0.5 MG tablet Take 1 tablet (0.5 mg total) by mouth daily as needed (flying). 5 tablet 0  . ondansetron (ZOFRAN) 4 MG tablet Take 1 tablet (4 mg total) by mouth every 6 (six) hours. 12 tablet 0  . tiZANidine (ZANAFLEX) 2 MG tablet Take 1 tablet (2 mg total) by mouth every 8 (eight) hours as needed for muscle spasms. 30 tablet 0  . triamcinolone cream (KENALOG) 0.1 % Apply 1 application topically 2 (two) times daily as needed. 45 g 0  . venlafaxine XR (EFFEXOR XR) 37.5 MG 24 hr capsule Take 1 capsule (37.5 mg total) by mouth daily with breakfast. 30 capsule 0   No current facility-administered medications on file prior to visit.      Past Medical History:  Diagnosis Date  . Anxiety   . Depression   .  GERD (gastroesophageal reflux disease)   . History of chicken pox   . Hyperlipidemia   . Migraine    complicated migraines with transient R side facial paralysis and aphasia  . Migraine headache with aura    hemiplegic migraine  . MVP (mitral valve prolapse)   . Osteopenia   . Reflex sympathetic dystrophy, unspecified 2011   R hand related to wrist fx, improved s/p nerve blocks  . Seizures (HCC)   . Stevens-Johnson disease (HCC)    Allergies    Allergen Reactions  . Cephalexin     REACTION: Levonne Spiller  . Epinephrine     REACTION: hypertensive reaction  . Erythromycin     REACTION: Rash  . Lactated Ringers     REACTION: cardiac symptoms  . Nortriptyline     Rapid heart rate  . Penicillins     REACTION: Rash  . Sulfamethoxazole-Trimethoprim     REACTION: Levonne Spiller  . Sulfonamide Derivatives   . Trimethoprim     REACTION: stevens johnson syndrome  . Wellbutrin [Bupropion] Other (See Comments)    tremors    Social History   Socioeconomic History  . Marital status: Married    Spouse name: None  . Number of children: None  . Years of education: None  . Highest education level: None  Social Needs  . Financial resource strain: None  . Food insecurity - worry: None  . Food insecurity - inability: None  . Transportation needs - medical: None  . Transportation needs - non-medical: None  Occupational History  . None  Tobacco Use  . Smoking status: Current Every Day Smoker    Packs/day: 1.00    Types: Cigarettes  . Smokeless tobacco: Never Used  . Tobacco comment: Quit over 30 years ago( just needs it a bit now ) Stress   Substance and Sexual Activity  . Alcohol use: No  . Drug use: No  . Sexual activity: No  Other Topics Concern  . None  Social History Narrative  . None    Vitals:   05/25/17 1520  BP: 125/82  Pulse: 74  Resp: 12  Temp: 98.3 F (36.8 C)  SpO2: 97%   Body mass index is 25.63 kg/m.    Physical Exam  Nursing note and vitals reviewed. Constitutional: She is oriented to person, place, and time. She appears well-developed. No distress.  HENT:  Head: Normocephalic and atraumatic.  Mouth/Throat: Oropharynx is clear and moist and mucous membranes are normal.  Eyes: Conjunctivae are normal.  Cardiovascular: Normal rate. An irregular rhythm present.  Occasional extrasystoles are present.  No murmur heard. Pulses:      Dorsalis pedis pulses are 2+ on the right side, and 2+  on the left side.  Respiratory: Effort normal and breath sounds normal. No respiratory distress.  GI: Soft. She exhibits no mass. There is no tenderness. There is no CVA tenderness.  Musculoskeletal: She exhibits no edema.  Lymphadenopathy:    She has no cervical adenopathy.  Neurological: She is alert and oriented to person, place, and time.  Antalgic gait assisted with a cane.   Skin: Skin is warm. No rash noted. No erythema.  Psychiatric: Her mood appears anxious.  Well groomed,good eye contact.    ASSESSMENT AND PLAN:   [name] was seen today for dysuria and fever.  Diagnoses and all orders for this visit:  Dysuria  Urine dipstick clear except for 2+ blood. Multiple abx allergies. After discussion of treatment options + some side  effects of abx, she prefers to wait until Ucx is back. Increasing fluid intake and starting OTC cranberry tabs daily recommended. Clearly instructed about warning signs.  -     POC Urinalysis Dipstick -     Urine culture  Fever, unspecified  Could be related to viral illness. Monitor for new symptoms. Instructed about warning signs.  -     POC Influenza A&B (Binax test)  Other microscopic hematuria  Possible etiologies discussed. Further recommendations will be given according to Ucx results.  -     Urine culture -     Urine Microscopic Only      -[name] was advised to return or notify a doctor immediately if symptoms worsen or persist or new concerns arise.       Betty G. [place], MD  [facility]. [facility].

## 2017-05-26 LAB — URINALYSIS, MICROSCOPIC ONLY

## 2017-05-28 ENCOUNTER — Ambulatory Visit: Payer: Self-pay | Admitting: *Deleted

## 2017-05-28 ENCOUNTER — Other Ambulatory Visit: Payer: Self-pay | Admitting: Family Medicine

## 2017-05-28 ENCOUNTER — Other Ambulatory Visit: Payer: Self-pay | Admitting: *Deleted

## 2017-05-28 ENCOUNTER — Telehealth: Payer: Self-pay | Admitting: Family Medicine

## 2017-05-28 LAB — URINE CULTURE
MICRO NUMBER: 90312708
SPECIMEN QUALITY:: ADEQUATE

## 2017-05-28 MED ORDER — CIPROFLOXACIN HCL 500 MG PO TABS: 500.0000 mg | ORAL_TABLET | Freq: Two times a day (BID) | ORAL | 0 refills | Status: DC

## 2017-05-28 MED ORDER — NITROFURANTOIN MONOHYD MACRO 100 MG PO CAPS: 100.0000 mg | ORAL_CAPSULE | Freq: Two times a day (BID) | ORAL | 0 refills | Status: DC

## 2017-05-28 NOTE — Telephone Encounter (Signed)
So we do not have many options for antibiotic treatment, she is allergic to [facility], [facility], and because her age we have to be cautious with Macrobid.  Celexa and Cipro can increase the risk of cardiac conduction issues. So at this time I think we have to go with Macrobid 100 mg bid for 5 days # 10 tabs.  Please call in Rx to her pharmacy and cancel Cipro.  Thanks, BJ

## 2017-05-28 NOTE — Telephone Encounter (Signed)
Informed patient that new prescription was sent to pharmacy.

## 2017-05-28 NOTE — Telephone Encounter (Signed)
Spoke with patient, gave instructions per Dr. Jordan. Patient verbalized understanding. 

## 2017-05-28 NOTE — Telephone Encounter (Signed)
Pt called with complaints of bloody urine and is still having urinary frequency and pain; she was seen in office on 05/25/17; she states that for 3 weeks she has had terrible back and knee pain; a MRI on 05/27/17 per orthopedics; spoke with Nettie Elm at [facility] and she requests that this information be routed to the office for provider review; notified pt of this and informed her that [facility] will call her back after Dr Swaziland has reviewed this; she verbalizes understanding; the pt can be contacted at [phone].     Reason for Disposition . Side (flank) or back pain present  Answer Assessment - Initial Assessment Questions 1. SYMPTOM: "What's the main symptom you're concerned about?" (e.g., frequency, incontinence)     Frequency and pain 2. ONSET: "When did the  ________  start?"     Seen in office 05/25/17; pain started 3 weeks ago 3. PAIN: "Is there any pain?" If so, ask: "How bad is it?" (Scale: 1-10; mild, moderate, severe)     moderate 4. CAUSE: "What do you think is causing the symptoms?"     ?UTI 5. OTHER SYMPTOMS: "Do you have any other symptoms?" (e.g., fever, flank pain, blood in urine, pain with urination)     blood 6. PREGNANCY: "Is there any chance you are pregnant?" "When was your last menstrual period?"     no  Answer Assessment - Initial Assessment Questions 1. COLOR of URINE: "Describe the color of the urine."  (e.g., tea-colored, pink, red, blood clots, bloody)     bloody 2. ONSET: "When did the bleeding start?"      05/28/17 3. EPISODES: "How many times has there been blood in the urine?" or "How many times today?"     Does not happen every time she urinates 4. PAIN with URINATION: "Is there any pain with passing your urine?" If so, ask: "How bad is the pain?"  (Scale 1-10; or mild, moderate, severe)    - MILD - complains slightly about urination hurting    - MODERATE - interferes with normal activities      - SEVERE - excruciating, unwilling or unable to  urinate because of the pain      moderate 5. FEVER: "Do you have a fever?" If so, ask: "What is your temperature, how was it measured, and when did it start?"     no 6. ASSOCIATED SYMPTOMS: "Are you passing urine more frequently than usual?"     Pain when urinating; seen in office 05/25/17 7. OTHER SYMPTOMS: "Do you have any other symptoms?" (e.g., back/flank pain, abdominal pain, vomiting)     Back and knee pain 8. PREGNANCY: "Is there any chance you are pregnant?" "When was your last menstrual period?"     no  Protocols used: URINE - BLOOD IN-A-AH, URINARY [facility]

## 2017-05-28 NOTE — Telephone Encounter (Signed)
Patient called, I asked what did the pharmacy say to her about taking Celexa and Cipro. She says "the pharmacist said I can't take Cipro with Celexa and to stop taking the Celexa." I advised this would be sent to the provider high priority and she would receive her recommendations.

## 2017-05-28 NOTE — Telephone Encounter (Addendum)
Ucx is back and grew E Coli, so because she is having symptoms Cipro 500 mg bid recommended for 5 days. Rx sent. Because gross hematuria we can also arrange appt with urologist, even though it can be related to cystitis. Discontinue Zanaflex while taking Ciprofloxacin.  Thanks, BJ

## 2017-05-28 NOTE — Telephone Encounter (Signed)
Copied from CRM [id number]. Topic: Quick Communication - See Telephone Encounter >> May 28, 2017  1:54 PM Terisa Starr wrote: CRM for notification. See Telephone encounter for:   05/28/17.  Pt said that the pharmacy said she should not be taking ciprofloxacin (CIPRO) 500 MG tablet mixed with  citalopram (CELEXA) 40 MG tablet . She said this makes her nervous. She would like a nurse to call her back [phone]  [id number] - [place], [place] - [address] AT [address]

## 2017-05-31 ENCOUNTER — Other Ambulatory Visit: Payer: Self-pay | Admitting: Family Medicine

## 2017-06-01 DIAGNOSIS — M545 Low back pain: Secondary | ICD-10-CM | POA: Diagnosis not present

## 2017-06-01 DIAGNOSIS — M5416 Radiculopathy, lumbar region: Secondary | ICD-10-CM | POA: Diagnosis not present

## 2017-06-03 DIAGNOSIS — M5416 Radiculopathy, lumbar region: Secondary | ICD-10-CM | POA: Diagnosis not present

## 2017-06-03 DIAGNOSIS — M545 Low back pain: Secondary | ICD-10-CM | POA: Diagnosis not present

## 2017-06-07 DIAGNOSIS — M25561 Pain in right knee: Secondary | ICD-10-CM | POA: Diagnosis not present

## 2017-06-15 DIAGNOSIS — M545 Low back pain: Secondary | ICD-10-CM | POA: Diagnosis not present

## 2017-06-15 DIAGNOSIS — M5416 Radiculopathy, lumbar region: Secondary | ICD-10-CM | POA: Diagnosis not present

## 2017-06-18 ENCOUNTER — Encounter: Payer: Self-pay | Admitting: Neurology

## 2017-06-18 ENCOUNTER — Ambulatory Visit: Payer: Medicare Other | Admitting: Neurology

## 2017-06-18 VITALS — BP 116/60 | HR 83 | Wt 144.6 lb

## 2017-06-18 DIAGNOSIS — G43409 Hemiplegic migraine, not intractable, without status migrainosus: Secondary | ICD-10-CM | POA: Diagnosis not present

## 2017-06-18 DIAGNOSIS — G250 Essential tremor: Secondary | ICD-10-CM | POA: Diagnosis not present

## 2017-06-18 DIAGNOSIS — F17200 Nicotine dependence, unspecified, uncomplicated: Secondary | ICD-10-CM

## 2017-06-18 NOTE — Progress Notes (Signed)
NEUROLOGY FOLLOW UP OFFICE NOTE  Lacey Salas [id number]  HISTORY OF PRESENT ILLNESS: Lacey Salas is a 72 year old right-handed woman with history of anxiety, depression, migraine, complex regional pain syndrome of right hand, hyperlipidemia, osteopenia, MVP and Stevens-Johnson syndrome who follows up for complex migraines (hemiplegic migraine and ocular migraine).   UPDATE: In December, venlafaxine was discontinued due to experiencing racing thoughts on the medication.  She was restarted on citalopram for depression and anxiety.  She was supposed to start Aimovig but never did as she hasn't hand any headaches.  Last month, she had two episodes of migraine aura with speech arrest and visual disturbance.  She was recently treated for lumbar stenosis with PT. Current NSAIDS:  no Current analgesics:  Tylenol Current triptans:  no Current anti-emetic:  no Current muscle relaxants:  no Current anti-anxiolytic:  lorazepam as needed (rarely takes) Current sleep aide:  no Current Antihypertensive medications:  atenolol 25mg  Current Antidepressant medications:  citalopram 40mg  Current Anticonvulsant medications:  no Current Vitamins/Herbal/Supplements:  no Current Antihistamines/Decongestants:  no Other therapy:  no   Caffeine:  no Alcohol:  no Smoker:  Quit 34 years ago but started again due to recent tragedy Depression:  Significant depression and anxiety.   SPELLS: She has been evaluated by several neurologists and headache specialists, such as Dr. Avie Echevaria, Dr. Fonnie Jarvis, Dr. Rudene Christians, Dr. Wandra Arthurs, and Dr. Marcelino Freestone. Occurs spontaneously, including while driving. Onset:  Has history of typical migraines (with headache, nausea and vomiting) since age 56.  This subsided and started to have these current spells starting in her early 46s.  70% of the time it is accompanied by headache. Location:  holocephalic Quality:  Non-throbbing pressure Initial intensity:   5/10 Aura:  no Prodrome:  Indescribable "foreboding" feeling Associated symptoms:  Migraines typically present as TIA-like symptoms, including right-sided weakness involving face, arm and leg, photophobia, word-finding difficulties, difficulty getting words out, slurred speech, staggering gait.  She is conscious and alert but doesn't feel like she can function during the spell.  Following episodes, she is fatigued and will sleep for 2-3 hours.  On rare occasions, accompanied by field cut. Initial duration:  30 minutes Initial frequency:  Varies.  Usually once a month but may be 3 times a week. Triggers/exacerbating factors:  Chemical smells, some perfumes, bright lights Relieving factors:  Laying down Activity:  Cannot function   MRI/MRA of head from 06/15/08 were unremarkable. EEGs from 11/02/12 and 09/27/13 were normal.   Past abortive therapy:  none Past preventative therapy:  Amitriptyline (initially for complex regional pain syndrome), venlafaxine (racing thoughts), Sinequan, onriboflavin   Family history of headache:  Maternal grandmother, sister  PAST MEDICAL HISTORY: Past Medical History:  Diagnosis Date  . Anxiety   . Depression   . GERD (gastroesophageal reflux disease)   . History of chicken pox   . Hyperlipidemia   . Migraine    complicated migraines with transient R side facial paralysis and aphasia  . Migraine headache with aura    hemiplegic migraine  . MVP (mitral valve prolapse)   . Osteopenia   . Reflex sympathetic dystrophy, unspecified 2011   R hand related to wrist fx, improved s/p nerve blocks  . Seizures (HCC)   . Stevens-Johnson disease (HCC)     MEDICATIONS: Current Outpatient Medications on File Prior to Visit  Medication Sig Dispense Refill  . aspirin EC 81 MG tablet Take 81 mg by mouth every 4 (four) hours as needed for  mild pain.    Marland Kitchen atenolol (TENORMIN) 25 MG tablet TAKE 1 TABLET BY MOUTH DAILY. OVERDUE FOR ANNUAL APPT MUST SEE MD FOR REFILLS 90  tablet 0  . citalopram (CELEXA) 40 MG tablet Take 1 tablet (40 mg total) by mouth daily. 90 tablet 0  . dicyclomine (BENTYL) 20 MG tablet Take 1 tablet (20 mg total) by mouth 2 (two) times daily. 12 tablet 0  . esomeprazole (NEXIUM) 20 MG capsule Take 1 capsule (20 mg total) by mouth daily before breakfast. OVC    . LORazepam (ATIVAN) 0.5 MG tablet Take 1 tablet (0.5 mg total) by mouth daily as needed (flying). 5 tablet 0  . ondansetron (ZOFRAN) 4 MG tablet Take 1 tablet (4 mg total) by mouth every 6 (six) hours. 12 tablet 0  . tiZANidine (ZANAFLEX) 2 MG tablet Take 1 tablet (2 mg total) by mouth every 8 (eight) hours as needed for muscle spasms. 30 tablet 0  . triamcinolone cream (KENALOG) 0.1 % Apply 1 application topically 2 (two) times daily as needed. 45 g 0  . venlafaxine XR (EFFEXOR XR) 37.5 MG 24 hr capsule Take 1 capsule (37.5 mg total) by mouth daily with breakfast. (Patient not taking: Reported on 06/18/2017) 30 capsule 0   No current facility-administered medications on file prior to visit.     ALLERGIES: Allergies  Allergen Reactions  . Cephalexin     REACTION: Levonne Spiller  . Epinephrine     REACTION: hypertensive reaction  . Erythromycin     REACTION: Rash  . Lactated Ringers     REACTION: cardiac symptoms  . Nortriptyline     Rapid heart rate  . Penicillins     REACTION: Rash  . Sulfamethoxazole-Trimethoprim     REACTION: Levonne Spiller  . Sulfonamide Derivatives   . Trimethoprim     REACTION: stevens johnson syndrome  . Wellbutrin [Bupropion] Other (See Comments)    tremors    FAMILY HISTORY: Family History  Problem Relation Age of Onset  . Leukemia Brother        CLL  . Arthritis Mother   . Arthritis Father   . Breast cancer Sister   . Arthritis Other   . Ovarian cancer Other   . Hyperlipidemia Other   . Hypertension Other   . Stroke Other     SOCIAL HISTORY: Social History   Socioeconomic History  . Marital status: Married    Spouse name:  Not on file  . Number of children: Not on file  . Years of education: Not on file  . Highest education level: Not on file  Occupational History  . Not on file  Social Needs  . Financial resource strain: Not on file  . Food insecurity:    Worry: Not on file    Inability: Not on file  . Transportation needs:    Medical: Not on file    Non-medical: Not on file  Tobacco Use  . Smoking status: Current Every Day Smoker    Packs/day: 1.00    Types: Cigarettes  . Smokeless tobacco: Never Used  . Tobacco comment: Quit over 30 years ago( just needs it a bit now ) Stress   Substance and Sexual Activity  . Alcohol use: No  . Drug use: No  . Sexual activity: Never  Lifestyle  . Physical activity:    Days per week: Not on file    Minutes per session: Not on file  . Stress: Not on file  Relationships  .  Social connections:    Talks on phone: Not on file    Gets together: Not on file    Attends religious service: Not on file    Active member of club or organization: Not on file    Attends meetings of clubs or organizations: Not on file    Relationship status: Not on file  . Intimate partner violence:    Fear of current or ex partner: Not on file    Emotionally abused: Not on file    Physically abused: Not on file    Forced sexual activity: Not on file  Other Topics Concern  . Not on file  Social History Narrative  . Not on file    REVIEW OF SYSTEMS: Constitutional: No fevers, chills, or sweats, no generalized fatigue, change in appetite Eyes: No visual changes, double vision, eye pain Ear, nose and throat: No hearing loss, ear pain, nasal congestion, sore throat Cardiovascular: No chest pain, palpitations Respiratory:  No shortness of breath at rest or with exertion, wheezes GastrointestinaI: No nausea, vomiting, diarrhea, abdominal pain, fecal incontinence Genitourinary:  No dysuria, urinary retention or frequency Musculoskeletal:  Mild back pain Integumentary: No rash,  pruritus, skin lesions Neurological: as above Psychiatric: No depression, insomnia, anxiety Endocrine: No palpitations, fatigue, diaphoresis, mood swings, change in appetite, change in weight, increased thirst Hematologic/Lymphatic:  No purpura, petechiae. Allergic/Immunologic: no itchy/runny eyes, nasal congestion, recent allergic reactions, rashes  PHYSICAL EXAM: Vitals:   06/18/17 0938  BP: 116/60  Pulse: 83  SpO2: 98%   General: No acute distress.  Patient appears well-groomed.  normal body habitus. Head:  Normocephalic/atraumatic Eyes:  Fundi examined but not visualized Neck: supple, no paraspinal tenderness, full range of motion Heart:  Regular rate and rhythm Lungs:  Clear to auscultation bilaterally Back: No paraspinal tenderness Neurological Exam: alert and oriented to person, place, and time. Attention span and concentration intact, recent and remote memory intact, fund of knowledge intact.  Speech fluent and not dysarthric, language intact.  CN II-XII intact. Bulk and tone normal, muscle strength 5/5 throughout.  Postural and kinetic tremor in hands.  Sensation to light touch, temperature and vibration intact.  Deep tendon reflexes 2+ throughout, toes downgoing.  Finger to nose and heel to shin testing intact.  Gait antalgic.  Romberg negative  IMPRESSION: Migraine with aura, not intractable/complicated migraine Tobacco use disorder  PLAN: Continue current regimen Tobacco cessation counseling (CPT 99406):  Tobacco smoker with hyperlipidemia  - Currently smoking 1 packs/day   - Patient was informed of the dangers of tobacco abuse including stroke, cancer, and MI, as well as benefits of tobacco cessation. - Patient is willing to quit at this time. - Approximately 5 mins were spent counseling patient cessation techniques. We discussed various methods to help quit smoking, including deciding on a date to quit, joining a support group, pharmacological agents- nicotine  gum/patch/lozenges, chantix.  - I will reassess her progress at the next follow-up visit  Follow up in 6 months.  25 minutes spent face to face with patient, over 50% spent discussing management.  Shon Millet, DO  CC:  Dr. [place]

## 2017-06-18 NOTE — Patient Instructions (Signed)
Follow up in 6 months 

## 2017-07-25 ENCOUNTER — Other Ambulatory Visit: Payer: Self-pay | Admitting: Family Medicine

## 2017-08-03 ENCOUNTER — Other Ambulatory Visit: Payer: Self-pay | Admitting: Neurology

## 2017-10-03 NOTE — Progress Notes (Addendum)
Subjective:   Lacey Salas is a 72 y.o. female who presents for Medicare Annual (Subsequent) preventive examination.  Reports health as not to good 72yo fibromyalgia; in the past fewmonths; was bent over   went to Ortho ; 3 vertebral stenosis got better Ribs hurt, feet are burning- see note below   States her husband left her 5 years ago Son is Acupuncturist Children are around has grand children Catholic; still grieving over spouse leaving x 5 yo  Has spiritual priest she can talk too  She was in for treatment  State her mind want stop   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 Hepatitis C Screening  May 24, 1945  . DEXA SCAN  10/31/2011  . COLONOSCOPY  06/05/2015   Agreed to hep c test at her next blood draw Colonoscopy 2017 see note below, will postpone and discuss with Dr. Swaziland  Declined bone density as she feels she would not take the medicine   Declines vaccines  Agrees to mammogram over the next few months  Colonoscopy to discuss with Dr. Swaziland  Cardiac Risk Factors include: advanced age (>74men, >6 women);dyslipidemia;smoking/ tobacco exposureTobacco cessation counseling  Current smoker; quit x 72 yo but has one occasionally when stressed       Objective:     Vitals: BP 106/60   Pulse 74   Ht 5' 3.5" (1.613 m)   Wt 145 lb (65.8 kg)   SpO2 95%   BMI 25.28 kg/m   Body mass index is 25.28 kg/m.  Advanced Directives 10/05/2017 05/21/2017 10/01/2016 09/18/2012  Does Patient Have a Medical Advance Directive? Yes No No Patient does not have advance directive;Patient would not like information  Would patient like information on  creating a medical advance directive? - - Yes (MAU/Ambulatory/Procedural Areas - Information given) -  Some encounter information is confidential and restricted. Go to Review Flowsheets activity to see all data.    Tobacco Social History   Tobacco Use  Smoking Status Current Every Day Smoker  . Packs/day: 1.00  . Types: Cigarettes  Smokeless Tobacco Never Used  Tobacco Comment   Quit over 30 years ago( just needs it a bit now ) Stress      Ready to quit: Not Answered Counseling given: Not Answered Comment: Quit over 30 years ago( just needs it a bit now ) Stress    Clinical Intake:     Past Medical History:  Diagnosis Date  . Anxiety   . Depression   . GERD (gastroesophageal reflux disease)   . History of chicken pox   . Hyperlipidemia   . Migraine    complicated migraines with transient R side facial paralysis and aphasia  . Migraine headache with aura    hemiplegic migraine  . MVP (mitral valve prolapse)   . Osteopenia   . Reflex sympathetic dystrophy, unspecified 2011   R hand related to wrist fx, improved s/p nerve blocks  . Seizures (HCC)   . Stevens-Johnson disease [facility])    Past Surgical History:  Procedure Laterality Date  . ABDOMINAL HYSTERECTOMY  BSO  . CATARACT EXTRACTION    . HEEL SPUR SURGERY  12/1996   right  . ORIF DISTAL RADIUS FRACTURE  2004   Family History  Problem Relation Age of Onset  . Leukemia Brother        CLL  . Arthritis Mother   . Arthritis Father   . Breast cancer Sister   . Arthritis Other   . Ovarian cancer Other   . Hyperlipidemia Other   . Hypertension Other   . Stroke Other    Social History   Socioeconomic History  . Marital status: Married    Spouse name: Not on file  . Number of children: Not on file  . Years of education: Not on file  . Highest education level: Not on file  Occupational History  . Not on file  Social Needs  . Financial resource strain: Not on file  . Food insecurity:    Worry: Not on  file    Inability: Not on file  . Transportation needs:    Medical: Not on file    Non-medical: Not on file  Tobacco Use  . Smoking status: Current Every Day Smoker    Packs/day: 1.00    Types: Cigarettes  . Smokeless tobacco: Never Used  . Tobacco comment: Quit over 30 years ago( just needs it a bit now ) Stress   Substance and Sexual Activity  . Alcohol use: No  . Drug use: No  . Sexual activity: Never  Lifestyle  . Physical activity:    Days per week: Not on file    Minutes per session: Not on file  . Stress: Not on file  Relationships  . Social connections:    Talks on phone: Not on file    Gets together: Not on file    Attends religious service: Not on file    Active member of club or organization: Not on file    Attends meetings of clubs or organizations: Not on file    Relationship status: Not on file  Other Topics Concern  . Not on file  Social History Narrative  . Not on file    Outpatient Encounter Medications as of 10/05/2017  Medication Sig  . atenolol (TENORMIN) 25 MG tablet Take 1 tablet (25 mg total) by mouth daily.  Marland Kitchen LORazepam (ATIVAN) 0.5 MG tablet Take 1 tablet (0.5 mg total) by mouth daily as needed (flying).  . venlafaxine XR (EFFEXOR XR) 37.5 MG 24 hr capsule Take 1 capsule (37.5 mg total) by mouth daily with breakfast.  . aspirin EC 81 MG tablet Take 81 mg by mouth every 4 (four) hours as needed for mild pain.  . citalopram (CELEXA) 40 MG tablet TAKE 1 TABLET BY MOUTH EVERY DAY (Patient not taking: Reported on 10/05/2017)  . dicyclomine (BENTYL) 20 MG tablet Take 1 tablet (20 mg total) by mouth 2 (two) times daily. (Patient not taking: Reported on 10/05/2017)  . esomeprazole (NEXIUM) 20 MG capsule Take 1 capsule (20 mg total) by mouth daily before breakfast. OVC (Patient not taking: Reported on 10/05/2017)  . ondansetron (ZOFRAN) 4 MG tablet Take 1 tablet (4 mg total) by mouth every 6 (six) hours. (Patient not taking: Reported on 10/05/2017)  . tiZANidine  (ZANAFLEX) 2 MG tablet Take 1 tablet (2 mg total) by mouth every 8 (eight) hours as needed for muscle spasms. (Patient not taking: Reported on 10/05/2017)  . triamcinolone cream (KENALOG) 0.1 % Apply 1 application topically 2 (two) times daily as needed. (  Patient not taking: Reported on 10/05/2017)   No facility-administered encounter medications on file as of 10/05/2017.     Activities of Daily Living In your present state of health, do you have any difficulty performing the following activities: 10/05/2017  Hearing? N  Vision? N  Difficulty concentrating or making decisions? Y  Comment agrees lack of focus but no issues with living independently   Walking or climbing stairs? N  Dressing or bathing? N  Doing errands, shopping? N  Preparing Food and eating ? N  Using the Toilet? N  Some recent data might be hidden    Patient Care Team: [place], Betty G, MD as PCP - General (Family Medicine)    Assessment:   This is a routine wellness examination for Lillyen.  Exercise Activities and Dietary recommendations Current Exercise Habits: Home exercise routine, Intensity: Mild, Exercise limited by: cardiac condition(s);orthopedic condition(s);neurologic condition(s)  Goals    . Patient Stated     Wants to be happy and feel good.        Fall Risk Fall Risk  10/05/2017 03/10/2017 03/10/2017 10/01/2016 03/31/2016  Falls in the past year? No No No No No  Risk for fall due to : - - - - -   Fall at home in Feb 2019   Depression Screen PHQ 2/9 Scores 10/05/2017 10/01/2016 09/02/2015 09/02/2015  PHQ - 2 Score 2 0 3 0  PHQ- 9 Score - - 16 -     Cognitive Function MMSE - Mini Mental State Exam 10/05/2017 03/10/2017  Not completed: (No Data) -  Orientation to time - 1  Orientation to Place - 4  Registration - 3  Attention/ Calculation - 3  Recall - 1  Language- name 2 objects - 2  Language- repeat - 1  Language- follow 3 step command - 2  Language- read & follow direction - 1  Write a  sentence - 1  Copy design - 0  Total score - 19   Montreal Cognitive Assessment  03/06/2014  Visuospatial/ Executive (0/5) 4  Naming (0/3) 3  Attention: Read list of digits (0/2) 2  Attention: Read list of letters (0/1) 1  Attention: Serial 7 subtraction starting at 100 (0/3) 2  Language: Repeat phrase (0/2) 2  Language : Fluency (0/1) 1  Abstraction (0/2) 2  Delayed Recall (0/5) 2  Orientation (0/6) 6  Total 25  Adjusted Score (based on education) 25      Immunization History  Administered Date(s) Administered  . Influenza Split 11/24/2011  . Influenza Whole 12/27/2007, 03/23/2009  . Influenza,inj,Quad PF,6+ Mos 12/06/2012, 01/09/2014  . PPD Test 12/15/2016  . Td 03/16/1992, 12/06/2006  . Tdap 08/27/2016      Screening Tests Health Maintenance  Topic Date Due  . Hepatitis C Screening  March 02, 1946  . DEXA SCAN  10/31/2011  . COLONOSCOPY  06/05/2015  . MAMMOGRAM  01/13/2018 (Originally 02/17/2014)  . PNA vac Low Risk Adult (1 of 2 - PCV13) 10/06/2018 (Originally 10/31/2011)  . INFLUENZA VACCINE  10/14/2017  . TETANUS/TDAP  08/28/2026         Plan:      PCP Notes   Health Maintenance Agreed to hep c test at her next blood draw Colonoscopy 2017 see note below, will postpone and discuss with Dr. Swaziland  Declined bone density as she feels she would not take the medicine   Declines vaccines  Agrees to mammogram over the next few months  Colonoscopy to discuss with Dr. Swaziland  Abnormal Screens  MMSE deferred due to patient concerns  Referrals  To make an apt for medical fup with Dr. Swaziland To make an apt with Dr. Jason Fila to evaluate for depression and treatment  Patient concerns; States she does not feel good OA In hands is worse;  Ribs hurt; c/o of pain in arms and ribs x 1 month Most of the time, it hurts when she lies down  Burning in her feet and skin  Vague areas of pain but states her overall pain score is an 8   Had MRI and found stenosis  -sees local orthopedic  Has not seen him since April  States she is concerned regarding other disease condition, as fibro.   Pain 1 -10  Worse in the am; at least an 8  C/o of brain fog    Admits to depression  Spent 40 minutes talking today about her spouse who left her 4 yo. 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  Denies SI; seems to be crying and "absorbed" with family issues and losses;  Agreed it was time to seek therapy and agreed to make an apt with Dr. Jason Fila, as well as Dr. Swaziland for medical test and fup deemed appropriate    Nurse Concerns; As noted Issues for AWV was not addressed due to the patient pronounced desire to ventilate about her family and losses and admits to not being able to focus her mind and activities.    Next PCP apt Apt with Dr. Swaziland 7/31 To check to see if she has apt with Dr. Jason Fila at that time.     I have personally reviewed and noted the following in the patient's chart:   . Medical and social history . Use of alcohol, tobacco or illicit drugs  . Current medications and supplements . Functional ability and status . Nutritional status . Physical activity . Advanced directives . List of other physicians . Hospitalizations, surgeries, and ER visits in previous 12 months . Vitals . Screenings to include cognitive, depression, and falls . Referrals and appointments  In addition, I have reviewed and discussed with patient certain preventive protocols, quality metrics, and best practice recommendations. A written personalized care plan for preventive services as well as general preventive health recommendations were provided to patient.     Bridgitte Felicetti, RN  10/05/2017  I have reviewed the documentation for the AWV and Advanced Care Planning provided by the health coach and agree with their  documentation. I was immediately available for any questions   Kristian Covey MD [facility]

## 2017-10-05 ENCOUNTER — Ambulatory Visit (INDEPENDENT_AMBULATORY_CARE_PROVIDER_SITE_OTHER): Payer: Medicare Other

## 2017-10-05 VITALS — BP 106/60 | HR 74 | Ht 63.5 in | Wt 145.0 lb

## 2017-10-05 DIAGNOSIS — Z0000 Encounter for general adult medical examination without abnormal findings: Secondary | ICD-10-CM

## 2017-10-05 DIAGNOSIS — Z1159 Encounter for screening for other viral diseases: Secondary | ICD-10-CM

## 2017-10-05 NOTE — Patient Instructions (Addendum)
[name] , Thank you for taking time to come for your Medicare Wellness Visit. I appreciate your ongoing commitment to your health goals. Please review the following plan we discussed and let me know if I can assist you in the future.   Will make an apt with Dr. Martinique to look into your new issues Will make an apt with Dr. Glennon Hamilton to evaluate for depression    Will make another apt for AWV next year    Preventive health due  Medicare now request all "baby boomers" test for possible exposure to Hepatitis C. Many may have been exposed due to dental work, tatoo's, vaccinations when young. The Hepatitis C virus is dormant for many years and then sometimes will cause liver cancer. If you gave blood in the past 15 years, you were most likely checked for Hep C. If you rec'd blood; you may want to consider testing or if you are high risk for any other reason.   Recommendations for Dexa Scan Female over the age of 14 Man age 11 or older If you broke a bone past the age of 27 Women menopausal age with risk factors (thin frame; smoker; hx of fx ) Post menopausal women under the age of 26 with risk factors A man age 42 to 85 with risk factors Other: Spine xray that is showing break of bone loss Back pain with possible break Height loss of 1/2 inch or more within one year Total loss in height of 1.5 inches from your original height  Calcium '1200mg'$  with Vit D 800u per day; more as directed by physician Strength building exercises discussed; can include walking; housework; small weights or stretch bands; silver sneakers if access to the [facility]  Please visit the osteoporosis foundation.org for up to date recommendations  Need to schedule a mammogram within 3 to 6 months Will schedule a colonoscopy last this year unless Dr. Martinique feels it needs to be done sooner    These are the goals we discussed: Goals    . Patient Stated     Wants to be happy and feel good.        This is a list of the  screening recommended for you and due dates:  Health Maintenance  Topic Date Due  .  Hepatitis C: One time screening is recommended by Center for Disease Control  (CDC) for  adults born from [birth year] through 1965.   Lacey Salas  . DEXA scan (bone density measurement)  71/17/2013  . Pneumonia vaccines (1 of 2 - PCV13) 71/17/2013  . Mammogram  02/17/2014  . Colon Cancer Screening  06/05/2015  . Flu Shot  10/14/2017  . Tetanus Vaccine  08/28/2026    Fall Prevention in the Home Falls can cause injuries and can affect people from all age groups. There are many simple things that you can do to make your home safe and to help prevent falls. What can I do on the outside of my home?  Regularly repair the edges of walkways and driveways and fix any cracks.  Remove high doorway thresholds.  Trim any shrubbery on the main path into your home.  Use bright outdoor lighting.  Clear walkways of debris and clutter, including tools and rocks.  Regularly check that handrails are securely fastened and in good repair. Both sides of any steps should have handrails.  Install guardrails along the edges of any raised decks or porches.  Have leaves, snow, and ice cleared regularly.  Use sand or salt  on walkways during winter months.  In the garage, clean up any spills right away, including grease or oil spills. What can I do in the bathroom?  Use night lights.  Install grab bars by the toilet and in the tub and shower. Do not use towel bars as grab bars.  Use non-skid mats or decals on the floor of the tub or shower.  If you need to sit down while you are in the shower, use a plastic, non-slip stool.  Keep the floor dry. Immediately clean up any water that spills on the floor.  Remove soap buildup in the tub or shower on a regular basis.  Attach bath mats securely with double-sided non-slip rug tape.  Remove throw rugs and other tripping hazards from the floor. What can I do in the  bedroom?  Use night lights.  Make sure that a bedside light is easy to reach.  Do not use oversized bedding that drapes onto the floor.  Have a firm chair that has side arms to use for getting dressed.  Remove throw rugs and other tripping hazards from the floor. What can I do in the kitchen?  Clean up any spills right away.  Avoid walking on wet floors.  Place frequently used items in easy-to-reach places.  If you need to reach for something above you, use a sturdy step stool that has a grab bar.  Keep electrical cables out of the way.  Do not use floor polish or wax that makes floors slippery. If you have to use wax, make sure that it is non-skid floor wax.  Remove throw rugs and other tripping hazards from the floor. What can I do in the stairways?  Do not leave any items on the stairs.  Make sure that there are handrails on both sides of the stairs. Fix handrails that are broken or loose. Make sure that handrails are as long as the stairways.  Check any carpeting to make sure that it is firmly attached to the stairs. Fix any carpet that is loose or worn.  Avoid having throw rugs at the top or bottom of stairways, or secure the rugs with carpet tape to prevent them from moving.  Make sure that you have a light switch at the top of the stairs and the bottom of the stairs. If you do not have them, have them installed. What are some other fall prevention tips?  Wear closed-toe shoes that fit well and support your feet. Wear shoes that have rubber soles or low heels.  When you use a stepladder, make sure that it is completely opened and that the sides are firmly locked. Have someone hold the ladder while you are using it. Do not climb a closed stepladder.  Add color or contrast paint or tape to grab bars and handrails in your home. Place contrasting color strips on the first and last steps.  Use mobility aids as needed, such as canes, walkers, scooters, and  crutches.  Turn on lights if it is dark. Replace any light bulbs that burn out.  Set up furniture so that there are clear paths. Keep the furniture in the same spot.  Fix any uneven floor surfaces.  Choose a carpet design that does not hide the edge of steps of a stairway.  Be aware of any and all pets.  Review your medicines with your healthcare provider. Some medicines can cause dizziness or changes in blood pressure, which increase your risk of falling. Talk with your  health care provider about other ways that you can decrease your risk of falls. This may include working with a physical therapist or trainer to improve your strength, balance, and endurance. This information is not intended to replace advice given to you by your health care provider. Make sure you discuss any questions you have with your health care provider. Document Released: 02/20/2002 Document Revised: 07/30/2015 Document Reviewed: 04/06/2014 Elsevier Interactive Patient Education  2018 [place] Maintenance, Female Adopting a healthy lifestyle and getting preventive care can go a long way to promote health and wellness. Talk with your health care provider about what schedule of regular examinations is right for you. This is a good chance for you to check in with your provider about disease prevention and staying healthy. In between checkups, there are plenty of things you can do on your own. Experts have done a lot of research about which lifestyle changes and preventive measures are most likely to keep you healthy. Ask your health care provider for more information. Weight and diet Eat a healthy diet  Be sure to include plenty of vegetables, fruits, low-fat dairy products, and lean protein.  Do not eat a lot of foods high in solid fats, added sugars, or salt.  Get regular exercise. This is one of the most important things you can do for your health. ? Most adults should exercise for at least 150  minutes each week. The exercise should increase your heart rate and make you sweat (moderate-intensity exercise). ? Most adults should also do strengthening exercises at least twice a week. This is in addition to the moderate-intensity exercise.  Maintain a healthy weight  Body mass index (BMI) is a measurement that can be used to identify possible weight problems. It estimates body fat based on height and weight. Your health care provider can help determine your BMI and help you achieve or maintain a healthy weight.  For females 4 years of age and older: ? A BMI below 18.5 is considered underweight. ? A BMI of 18.5 to 24.9 is normal. ? A BMI of 25 to 29.9 is considered overweight. ? A BMI of 30 and above is considered obese.  Watch levels of cholesterol and blood lipids  You should start having your blood tested for lipids and cholesterol at 71 years of age, then have this test every 5 years.  You may need to have your cholesterol levels checked more often if: ? Your lipid or cholesterol levels are high. ? You are older than 71 years of age. ? You are at high risk for heart disease.  Cancer screening Lung Cancer  Lung cancer screening is recommended for adults 24-67 years old who are at high risk for lung cancer because of a history of smoking.  A yearly low-dose CT scan of the lungs is recommended for people who: ? Currently smoke. ? Have quit within the past 15 years. ? Have at least a 30-pack-year history of smoking. A pack year is smoking an average of one pack of cigarettes a day for 1 year.  Yearly screening should continue until it has been 15 years since you quit.  Yearly screening should stop if you develop a health problem that would prevent you from having lung cancer treatment.  Breast Cancer  Practice breast self-awareness. This means understanding how your breasts normally appear and feel.  It also means doing regular breast self-exams. Let your health care  provider know about any changes, no matter  how small.  If you are in your 20s or 30s, you should have a clinical breast exam (CBE) by a health care provider every 1-3 years as part of a regular health exam.  If you are 18 or older, have a CBE every year. Also consider having a breast X-ray (mammogram) every year.  If you have a family history of breast cancer, talk to your health care provider about genetic screening.  If you are at high risk for breast cancer, talk to your health care provider about having an MRI and a mammogram every year.  Breast cancer gene (BRCA) assessment is recommended for women who have family members with BRCA-related cancers. BRCA-related cancers include: ? Breast. ? Ovarian. ? Tubal. ? Peritoneal cancers.  Results of the assessment will determine the need for genetic counseling and BRCA1 and BRCA2 testing.  Cervical Cancer Your health care provider may recommend that you be screened regularly for cancer of the pelvic organs (ovaries, uterus, and vagina). This screening involves a pelvic examination, including checking for microscopic changes to the surface of your cervix (Pap test). You may be encouraged to have this screening done every 3 years, beginning at age 82.  For women ages 83-65, health care providers may recommend pelvic exams and Pap testing every 3 years, or they may recommend the Pap and pelvic exam, combined with testing for human papilloma virus (HPV), every 5 years. Some types of HPV increase your risk of cervical cancer. Testing for HPV may also be done on women of any age with unclear Pap test results.  Other health care providers may not recommend any screening for nonpregnant women who are considered low risk for pelvic cancer and who do not have symptoms. Ask your health care provider if a screening pelvic exam is right for you.  If you have had past treatment for cervical cancer or a condition that could lead to cancer, you need Pap tests  and screening for cancer for at least 20 years after your treatment. If Pap tests have been discontinued, your risk factors (such as having a new sexual partner) need to be reassessed to determine if screening should resume. Some women have medical problems that increase the chance of getting cervical cancer. In these cases, your health care provider may recommend more frequent screening and Pap tests.  Colorectal Cancer  This type of cancer can be detected and often prevented.  Routine colorectal cancer screening usually begins at 71 years of age and continues through 71 years of age.  Your health care provider may recommend screening at an earlier age if you have risk factors for colon cancer.  Your health care provider may also recommend using home test kits to check for hidden blood in the stool.  A small camera at the end of a tube can be used to examine your colon directly (sigmoidoscopy or colonoscopy). This is done to check for the earliest forms of colorectal cancer.  Routine screening usually begins at age 65.  Direct examination of the colon should be repeated every 5-10 years through 71 years of age. However, you may need to be screened more often if early forms of precancerous polyps or small growths are found.  Skin Cancer  Check your skin from head to toe regularly.  Tell your health care provider about any new moles or changes in moles, especially if there is a change in a mole's shape or color.  Also tell your health care provider if you have a mole  that is larger than the size of a pencil eraser.  Always use sunscreen. Apply sunscreen liberally and repeatedly throughout the day.  Protect yourself by wearing long sleeves, pants, a wide-brimmed hat, and sunglasses whenever you are outside.  Heart disease, diabetes, and high blood pressure  High blood pressure causes heart disease and increases the risk of stroke. High blood pressure is more likely to develop  in: ? People who have blood pressure in the high end of the normal range (130-139/85-89 mm Hg). ? People who are overweight or obese. ? People who are African American.  If you are 27-91 years of age, have your blood pressure checked every 3-5 years. If you are 68 years of age or older, have your blood pressure checked every year. You should have your blood pressure measured twice-once when you are at a hospital or clinic, and once when you are not at a hospital or clinic. Record the average of the two measurements. To check your blood pressure when you are not at a hospital or clinic, you can use: ? An automated blood pressure machine at a pharmacy. ? A home blood pressure monitor.  If you are between 68 years and 32 years old, ask your health care provider if you should take aspirin to prevent strokes.  Have regular diabetes screenings. This involves taking a blood sample to check your fasting blood sugar level. ? If you are at a normal weight and have a low risk for diabetes, have this test once every three years after 71 years of age. ? If you are overweight and have a high risk for diabetes, consider being tested at a younger age or more often. Preventing infection Hepatitis B  If you have a higher risk for hepatitis B, you should be screened for this virus. You are considered at high risk for hepatitis B if: ? You were born in a country where hepatitis B is common. Ask your health care provider which countries are considered high risk. ? Your parents were born in a high-risk country, and you have not been immunized against hepatitis B (hepatitis B vaccine). ? You have HIV or AIDS. ? You use needles to inject street drugs. ? You live with someone who has hepatitis B. ? You have had sex with someone who has hepatitis B. ? You get hemodialysis treatment. ? You take certain medicines for conditions, including cancer, organ transplantation, and autoimmune conditions.  Hepatitis C  Blood  testing is recommended for: ? Everyone born from [birth year] through 1964. ? Anyone with known risk factors for hepatitis C.  Sexually transmitted infections (STIs)  You should be screened for sexually transmitted infections (STIs) including gonorrhea and chlamydia if: ? You are sexually active and are younger than 71 years of age. ? You are older than 71 years of age and your health care provider tells you that you are at risk for this type of infection. ? Your sexual activity has changed since you were last screened and you are at an increased risk for chlamydia or gonorrhea. Ask your health care provider if you are at risk.  If you do not have HIV, but are at risk, it may be recommended that you take a prescription medicine daily to prevent HIV infection. This is called pre-exposure prophylaxis (PrEP). You are considered at risk if: ? You are sexually active and do not regularly use condoms or know the HIV status of your partner(s). ? You take drugs by injection. ? You are  sexually active with a partner who has HIV.  Talk with your health care provider about whether you are at high risk of being infected with HIV. If you choose to begin PrEP, you should first be tested for HIV. You should then be tested every 3 months for as long as you are taking PrEP. Pregnancy  If you are premenopausal and you may become pregnant, ask your health care provider about preconception counseling.  If you may become pregnant, take 400 to 800 micrograms (mcg) of folic acid every day.  If you want to prevent pregnancy, talk to your health care provider about birth control (contraception). Osteoporosis and menopause  Osteoporosis is a disease in which the bones lose minerals and strength with aging. This can result in serious bone fractures. Your risk for osteoporosis can be identified using a bone density scan.  If you are 62 years of age or older, or if you are at risk for osteoporosis and fractures, ask your  health care provider if you should be screened.  Ask your health care provider whether you should take a calcium or vitamin D supplement to lower your risk for osteoporosis.  Menopause may have certain physical symptoms and risks.  Hormone replacement therapy may reduce some of these symptoms and risks. Talk to your health care provider about whether hormone replacement therapy is right for you. Follow these instructions at home:  Schedule regular health, dental, and eye exams.  Stay current with your immunizations.  Do not use any tobacco products including cigarettes, chewing tobacco, or electronic cigarettes.  If you are pregnant, do not drink alcohol.  If you are breastfeeding, limit how much and how often you drink alcohol.  Limit alcohol intake to no more than 1 drink per day for nonpregnant women. One drink equals 12 ounces of beer, 5 ounces of wine, or 1 ounces of hard liquor.  Do not use street drugs.  Do not share needles.  Ask your health care provider for help if you need support or information about quitting drugs.  Tell your health care provider if you often feel depressed.  Tell your health care provider if you have ever been abused or do not feel safe at home. This information is not intended to replace advice given to you by your health care provider. Make sure you discuss any questions you have with your health care provider. Document Released: 09/15/2010 Document Revised: 08/08/2015 Document Reviewed: 12/04/2014 Elsevier Interactive Patient Education  [employer].

## 2017-10-13 ENCOUNTER — Encounter: Payer: Self-pay | Admitting: Family Medicine

## 2017-10-13 ENCOUNTER — Ambulatory Visit: Payer: Medicare Other | Admitting: Family Medicine

## 2017-10-13 VITALS — BP 110/66 | HR 88 | Temp 98.5°F | Resp 12 | Ht 63.5 in | Wt 145.1 lb

## 2017-10-13 DIAGNOSIS — F419 Anxiety disorder, unspecified: Secondary | ICD-10-CM | POA: Diagnosis not present

## 2017-10-13 DIAGNOSIS — M797 Fibromyalgia: Secondary | ICD-10-CM

## 2017-10-13 DIAGNOSIS — F329 Major depressive disorder, single episode, unspecified: Secondary | ICD-10-CM

## 2017-10-13 DIAGNOSIS — M2550 Pain in unspecified joint: Secondary | ICD-10-CM

## 2017-10-13 DIAGNOSIS — G43409 Hemiplegic migraine, not intractable, without status migrainosus: Secondary | ICD-10-CM | POA: Diagnosis not present

## 2017-10-13 DIAGNOSIS — F32A Depression, unspecified: Secondary | ICD-10-CM

## 2017-10-13 MED ORDER — GABAPENTIN 100 MG PO CAPS: 300.0000 mg | ORAL_CAPSULE | Freq: Every day | ORAL | 0 refills | Status: DC

## 2017-10-13 NOTE — Assessment & Plan Note (Signed)
We discussed symptoms and treatment options. Some of symptoms she is reporting could be related to fibromyalgia. After discussion of some side effects she agrees with trying Gabapentin, titrate dose from 100 mg to 300 mg as tolerated. We could consider Savela if she does not tolerate Gabapentin well. She would benefit from low impact exercise. Good sleep hygiene.

## 2017-10-13 NOTE — Progress Notes (Signed)
HPI:  Chief Complaint  Patient presents with  . Follow-up    [name] is a 72 y.o. female, who is here today because she wants to discuss concerns she voiced during recent Medicare visit.   I saw her on 05/25/17 after a fall and had her AWV on 10/05/17.  She is c/o generalized arthralgias and myalgias, pain all over. She has Hx of fibromyalgia and OA.  "Every bone in my body hurts." "Burning all over."   PT helped with back pain.  C/O tender nodules in IP joints. No erythema. Pain is exacerbated by movement. Alleviated by rest.   Lab Results  Component Value Date   CRP 0.5 12/15/2016   Lab Results  Component Value Date   ESRSEDRATE 25 12/15/2016   Referral to rheumatologist was placed and not accepted by providers.  + Fatigue. Dry skin, pruritic.No rash or erythema. Hair falling, no alopecic areas.   She thinks Effexor is causing or aggravating these symptoms.  She has follows with neurologist for hemiplegic migraine, Dr. Ricka Burdock. She has not have episodes of migraine since she started Effexor and was able to drive again.  She is on atenolol 25 mg 1/2 tab bid and Effexor XR 37.5 mg daily.  Anxiety and depression. She takes Lorazepam 0.5 mg daily as needed. Anxiety is exacerbated by flying. + Stress, "a lot of things going on.     Review of Systems  Constitutional: Positive for fatigue. Negative for activity change, appetite change and fever.  HENT: Negative for mouth sores, nosebleeds and trouble swallowing.   Eyes: Negative for redness and visual disturbance.  Respiratory: Negative for cough, shortness of breath and wheezing.   Cardiovascular: Negative for chest pain, palpitations and leg swelling.  Gastrointestinal: Negative for abdominal pain, nausea and vomiting.       Negative for changes in bowel habits.  Genitourinary: Negative for decreased urine volume, dysuria and hematuria.  Musculoskeletal: Positive for arthralgias, back pain and  myalgias.  Skin: Negative for rash and wound.  Neurological: Negative for syncope, weakness and headaches.  Psychiatric/Behavioral: Negative for confusion. The patient is nervous/anxious.       Current Outpatient Medications on File Prior to Visit  Medication Sig Dispense Refill  . Acetaminophen (TYLENOL 8 HOUR PO) Take by mouth daily.    Marland Kitchen atenolol (TENORMIN) 25 MG tablet Take 1 tablet (25 mg total) by mouth daily. (Patient taking differently: Take 25 mg by mouth daily. ) 90 tablet 0  . venlafaxine XR (EFFEXOR XR) 37.5 MG 24 hr capsule Take 1 capsule (37.5 mg total) by mouth daily with breakfast. 30 capsule 0  . aspirin EC 81 MG tablet Take 81 mg by mouth every 4 (four) hours as needed for mild pain.    Marland Kitchen esomeprazole (NEXIUM) 20 MG capsule Take 1 capsule (20 mg total) by mouth daily before breakfast. OVC (Patient not taking: Reported on 10/05/2017)     No current facility-administered medications on file prior to visit.      Past Medical History:  Diagnosis Date  . Anxiety   . Depression   . GERD (gastroesophageal reflux disease)   . History of chicken pox   . Hyperlipidemia   . Migraine    complicated migraines with transient R side facial paralysis and aphasia  . Migraine headache with aura    hemiplegic migraine  . MVP (mitral valve prolapse)   . Osteopenia   . Reflex sympathetic dystrophy, unspecified 2011   R hand related  to wrist fx, improved s/p nerve blocks  . Seizures (HCC)   . Stevens-Johnson disease (HCC)    Allergies  Allergen Reactions  . Cephalexin     REACTION: Levonne Spiller  . Epinephrine     REACTION: hypertensive reaction  . Erythromycin     REACTION: Rash  . Lactated Ringers     REACTION: cardiac symptoms  . Nortriptyline     Rapid heart rate  . Penicillins     REACTION: Rash  . Sulfamethoxazole-Trimethoprim     REACTION: Levonne Spiller  . Sulfonamide Derivatives   . Trimethoprim     REACTION: stevens johnson syndrome  . Wellbutrin  [Bupropion] Other (See Comments)    tremors    Social History   Socioeconomic History  . Marital status: Married    Spouse name: Not on file  . Number of children: Not on file  . Years of education: Not on file  . Highest education level: Not on file  Occupational History  . Not on file  Social Needs  . Financial resource strain: Not on file  . Food insecurity:    Worry: Not on file    Inability: Not on file  . Transportation needs:    Medical: Not on file    Non-medical: Not on file  Tobacco Use  . Smoking status: Current Every Day Smoker    Packs/day: 1.00    Types: Cigarettes  . Smokeless tobacco: Never Used  . Tobacco comment: Quit over 30 years ago( just needs it a bit now ) Stress   Substance and Sexual Activity  . Alcohol use: No  . Drug use: No  . Sexual activity: Never  Lifestyle  . Physical activity:    Days per week: Not on file    Minutes per session: Not on file  . Stress: Not on file  Relationships  . Social connections:    Talks on phone: Not on file    Gets together: Not on file    Attends religious service: Not on file    Active member of club or organization: Not on file    Attends meetings of clubs or organizations: Not on file    Relationship status: Not on file  Other Topics Concern  . Not on file  Social History Narrative  . Not on file    Vitals:   10/13/17 0959  BP: 110/66  Pulse: 88  Resp: 12  Temp: 98.5 F (36.9 C)  SpO2: 97%   Body mass index is 25.3 kg/m.   Physical Exam  Nursing note and vitals reviewed. Constitutional: She is oriented to person, place, and time. She appears well-developed and well-nourished. No distress.  HENT:  Head: Normocephalic and atraumatic.  Mouth/Throat: Oropharynx is clear and moist and mucous membranes are normal.  Eyes: Pupils are equal, round, and reactive to light. Conjunctivae are normal.  Cardiovascular: Normal rate and regular rhythm.  No murmur heard. Pulses:      Dorsalis pedis  pulses are 2+ on the right side, and 2+ on the left side.  Respiratory: Effort normal and breath sounds normal. No respiratory distress.  GI: Soft. She exhibits no mass. There is no hepatomegaly. There is no tenderness.  Musculoskeletal: She exhibits no edema.  + Trigger points tender on upper and lower back,chest wall,and 4 extremities symmetric.  Some IP joints with Heberden's node and Bouchard's nodes.  No signs of synovitis.   Lymphadenopathy:    She has no cervical adenopathy.  Neurological: She is  alert and oriented to person, place, and time. She has normal strength. Gait normal.  Skin: Skin is warm. No rash noted. No erythema.  Psychiatric: Her mood appears anxious.  Well groomed, good eye contact.       ASSESSMENT AND PLAN:   [name] was seen today for follow-up.  No orders of the defined types were placed in this encounter.   Anxiety and depression Still symptomatic,mainly anxiety. Because she thinks Effexor is aggravating some of her symptoms,she will continue same dose for now. No changes in Lorazepam 0.5 mg daily as needed.   Polyarthralgia Inflammatory markers negative in 12/2017. Most likely OA. Tylenol 500 mg qid prn.  Fibromyalgia We discussed symptoms and treatment options. Some of symptoms she is reporting could be related to fibromyalgia. After discussion of some side effects she agrees with trying Gabapentin, titrate dose from 100 mg to 300 mg as tolerated. We could consider Savela if she does not tolerate Gabapentin well. She would benefit from low impact exercise. Good sleep hygiene.  Hemiplegic migraine without status migrainosus, not intractable Problem has been well controlled since started on Effexor, so I recommend continuing it. Continue following with Dr Ricka Burdock.   Face to face OV from 10:17 to 10:47 am.     Lexx Monte G. [place], MD  [facility]. [place] office.

## 2017-10-13 NOTE — Patient Instructions (Addendum)
A few things to remember from today's visit:   Fibromyalgia - Plan: gabapentin (NEURONTIN) 100 MG capsule  Polyarthralgia - Plan: Acetaminophen (TYLENOL 8 HOUR PO)  Anxiety and depression  Gabapentin 100 mg at bedtime , 4th day 2 caps, and then 4 days later increase it to 3 caps.   Please be sure medication list is accurate. If a new problem present, please set up appointment sooner than planned today.

## 2017-10-13 NOTE — Assessment & Plan Note (Signed)
Inflammatory markers negative in 12/2017. Most likely OA. Tylenol 500 mg qid prn.

## 2017-10-13 NOTE — Assessment & Plan Note (Signed)
Still symptomatic,mainly anxiety. Because she thinks Effexor is aggravating some of her symptoms,she will continue same dose for now. No changes in Lorazepam 0.5 mg daily as needed.

## 2017-10-13 NOTE — Assessment & Plan Note (Signed)
Problem has been well controlled since started on Effexor, so I recommend continuing it. Continue following with Dr Ricka Burdock.

## 2017-10-18 DIAGNOSIS — H524 Presbyopia: Secondary | ICD-10-CM | POA: Diagnosis not present

## 2017-11-05 ENCOUNTER — Other Ambulatory Visit: Payer: Self-pay | Admitting: Family Medicine

## 2017-11-09 ENCOUNTER — Other Ambulatory Visit: Payer: Self-pay | Admitting: *Deleted

## 2017-11-09 DIAGNOSIS — M797 Fibromyalgia: Secondary | ICD-10-CM

## 2017-11-09 MED ORDER — GABAPENTIN 100 MG PO CAPS: 300.0000 mg | ORAL_CAPSULE | Freq: Every day | ORAL | 0 refills | Status: DC

## 2017-11-16 ENCOUNTER — Ambulatory Visit (INDEPENDENT_AMBULATORY_CARE_PROVIDER_SITE_OTHER): Payer: Medicare Other

## 2017-11-16 ENCOUNTER — Encounter: Payer: Self-pay | Admitting: Family Medicine

## 2017-11-16 ENCOUNTER — Ambulatory Visit: Payer: Medicare Other | Admitting: Family Medicine

## 2017-11-16 VITALS — BP 118/70 | HR 74 | Temp 98.3°F | Resp 12 | Ht 63.5 in | Wt 144.2 lb

## 2017-11-16 DIAGNOSIS — F329 Major depressive disorder, single episode, unspecified: Secondary | ICD-10-CM

## 2017-11-16 DIAGNOSIS — R310 Gross hematuria: Secondary | ICD-10-CM

## 2017-11-16 DIAGNOSIS — M2550 Pain in unspecified joint: Secondary | ICD-10-CM

## 2017-11-16 DIAGNOSIS — I499 Cardiac arrhythmia, unspecified: Secondary | ICD-10-CM | POA: Insufficient documentation

## 2017-11-16 DIAGNOSIS — F32A Depression, unspecified: Secondary | ICD-10-CM

## 2017-11-16 DIAGNOSIS — R053 Chronic cough: Secondary | ICD-10-CM

## 2017-11-16 DIAGNOSIS — F419 Anxiety disorder, unspecified: Secondary | ICD-10-CM

## 2017-11-16 DIAGNOSIS — M797 Fibromyalgia: Secondary | ICD-10-CM

## 2017-11-16 DIAGNOSIS — R05 Cough: Secondary | ICD-10-CM | POA: Diagnosis not present

## 2017-11-16 DIAGNOSIS — R5382 Chronic fatigue, unspecified: Secondary | ICD-10-CM

## 2017-11-16 LAB — CBC WITH DIFFERENTIAL/PLATELET
Basophils Absolute: 0 10*3/uL (ref 0.0–0.1)
Basophils Relative: 0.4 % (ref 0.0–3.0)
EOS PCT: 1.6 % (ref 0.0–5.0)
Eosinophils Absolute: 0.1 10*3/uL (ref 0.0–0.7)
HCT: 39.9 % (ref 36.0–46.0)
Hemoglobin: 13.5 g/dL (ref 12.0–15.0)
LYMPHS ABS: 3.2 10*3/uL (ref 0.7–4.0)
Lymphocytes Relative: 38.8 % (ref 12.0–46.0)
MCHC: 33.7 g/dL (ref 30.0–36.0)
MCV: 90.9 fl (ref 78.0–100.0)
MONO ABS: 0.7 10*3/uL (ref 0.1–1.0)
MONOS PCT: 8.7 % (ref 3.0–12.0)
NEUTROS ABS: 4.2 10*3/uL (ref 1.4–7.7)
NEUTROS PCT: 50.5 % (ref 43.0–77.0)
Platelets: 334 10*3/uL (ref 150.0–400.0)
RBC: 4.39 Mil/uL (ref 3.87–5.11)
RDW: 13.7 % (ref 11.5–15.5)
WBC: 8.4 10*3/uL (ref 4.0–10.5)

## 2017-11-16 LAB — URINALYSIS, ROUTINE W REFLEX MICROSCOPIC
BILIRUBIN URINE: NEGATIVE
KETONES UR: NEGATIVE
Leukocytes, UA: NEGATIVE
Nitrite: NEGATIVE
PH: 5.5 (ref 5.0–8.0)
SPECIFIC GRAVITY, URINE: 1.025 (ref 1.000–1.030)
Total Protein, Urine: NEGATIVE
URINE GLUCOSE: NEGATIVE
UROBILINOGEN UA: 0.2 (ref 0.0–1.0)
WBC, UA: NONE SEEN (ref 0–?)

## 2017-11-16 LAB — SEDIMENTATION RATE: SED RATE: 42 mm/h — AB (ref 0–30)

## 2017-11-16 LAB — C-REACTIVE PROTEIN: CRP: 1 mg/dL (ref 0.5–20.0)

## 2017-11-16 NOTE — Patient Instructions (Addendum)
A few things to remember from today's visit:   Gross hematuria - Plan: Urinalysis, Routine w reflex microscopic  Polyarthralgia - Plan: Ambulatory referral to Rheumatology, ANA, Sedimentation rate, C-reactive protein, CBC with Differential/Platelet  Chronic cough - Plan: DG Chest 2 View, CBC with Differential/Platelet  Fibromyalgia   Please be sure medication list is accurate. If a new problem present, please set up appointment sooner than planned today.

## 2017-11-16 NOTE — Assessment & Plan Note (Signed)
I still think this problem will explain most of her symptoms. She is not interested in trying gabapentin or other pharmacologic treatment. Continue low impact exercise and good sleep hygiene.

## 2017-11-16 NOTE — Assessment & Plan Note (Signed)
Possible etiology discussed. ?  COPD, allergies, GERD among some. Strongly recommend smoking cessation but she is not interested in doing so. Further recommendation will be given according to CXR results.

## 2017-11-16 NOTE — Progress Notes (Signed)
HPI:   Lacey Salas is a 72 y.o. female, who is here today to follow on recent OV.  Today she also has multiple concerns.  She was seen on 10/13/2017, when gabapentin was started to treat generalized myalgias arthralgias. She did not start gabapentin because she read side effects and then she will remembered that when she took medication a few years ago, she was "jittery" and could not tolerate noise. She has history of fibromyalgia and polyarthralgia. I did refer her to rheumatology as a few months ago but she did hear about appointment.  She is complaining of generalized myalgias that include rib cage, abdominal wall,lower and upper back pain. Hip pain and burning sensation, which seems to be worse when she is in bed. "Burns all over", it feels like she is on "fire."   Coughing a lot 1-2 years ago. She is having clear sputum Denies hemoptysis.  No associated dyspnea or wheezing. + Tobacco use, she is not interested in any smoking cessation. No abnormal wt loss.  "Tire of feeling bad." Fatigue, which has been going on for a while. According to patient, while she was in the ER earlier this year 1 of the nurses told her she may have sleep apnea.   Hx of RSD RUE.  She states that now she has same symptoms in "other parts of my body, falling,no balance." Tingling is now "everywhere." Last fall 3 weeks ago.   She did PT for fall prevention but could not afford it,so she got exercises recommendations to do at home.   She states that she has not mentioned above symptoms to her neurologist.  Last visit in 06/2017. History of hemiplegic migraine, states that she has not had headache in the past few months.  -She does not think that all her symptoms are related to fibromyalgia.  States that she wants to find out "what is going on with me" she also mentions that her brother has MM and another brother has leukemia.   She denies night sweats, fevers, or chills.  Lab Results    Component Value Date   ALT 21 05/21/2017   AST 23 05/21/2017   ALKPHOS 92 05/21/2017   BILITOT 0.6 05/21/2017    Total protein in normal range. She had a abnormal CBC in 05/2017 when she was evaluated in the ER.   Lab Results  Component Value Date   WBC 22.4 (H) 05/21/2017   HGB 15.1 (H) 05/21/2017   HCT 45.4 05/21/2017   MCV 93.6 05/21/2017   PLT 305 05/21/2017     Also concerned about irregular HR, denies symptoms. EKG 09/2012 sinus arrhythmia.  She states that she was told her mitral valve prolapse was not seen on last echo. Denies chest pain, dyspnea, palpitation, claudication, focal weakness, or edema.   -Blood in urine in 05/2017, she states that this was associated with a UTI and has not had it since she completed abx treatment.  She is really concerned about "occult blood" in urine. She denies dysuria, changes in urinary frequency, or suprapubic abdominal pain.   "Weird rashes." Intermittent on different areas of her body, she is not sure if it is pruritic. 1-3 times per year; last episode either February this year or last year, she is not sure. According to patient, she was told it could be shingles\was already resolving. She has a picture in her phone, it seems maculopapular erythematous rash on pretibial areas. Currently she is asymptomatic.  One time she had  associated fever and "feeling real sick." According to pt, dermatologist told her that she had lupus and was recommended to see rheuma. She has seen rheumatologist about 20 years ago and "lupus test"was negative.  Anxiety and depression: She is no longer on Effexor, it was causing racing thoughts. Currently she is on Celexa 40 mg daily. She denies depressed mood or suicidal thoughts.    Review of Systems  Constitutional: Positive for fatigue. Negative for activity change, appetite change and fever.  HENT: Negative for facial swelling, mouth sores, nosebleeds and trouble swallowing.   Eyes: Negative  for redness and visual disturbance.  Respiratory: Positive for cough. Negative for shortness of breath and wheezing.   Cardiovascular: Negative for chest pain, palpitations and leg swelling.  Gastrointestinal: Negative for abdominal pain, nausea and vomiting.       Negative for changes in bowel habits.  Endocrine: Negative for cold intolerance and heat intolerance.  Genitourinary: Negative for decreased urine volume, dysuria and pelvic pain.  Musculoskeletal: Positive for arthralgias, back pain and myalgias. Negative for gait problem.  Skin: Negative for rash (intermittently.) and wound.  Neurological: Positive for weakness. Negative for seizures, syncope, speech difficulty and headaches.  Hematological: Negative for adenopathy. Does not bruise/bleed easily.  Psychiatric/Behavioral: Positive for sleep disturbance. Negative for confusion. The patient is nervous/anxious.       Current Outpatient Medications on File Prior to Visit  Medication Sig Dispense Refill  . Acetaminophen (TYLENOL 8 HOUR PO) Take by mouth daily.    Marland Kitchen aspirin EC 81 MG tablet Take 81 mg by mouth every 4 (four) hours as needed for mild pain.    Marland Kitchen atenolol (TENORMIN) 25 MG tablet Take 1 tablet (25 mg total) by mouth daily. 90 tablet 2  . citalopram (CELEXA) 40 MG tablet   0  . venlafaxine XR (EFFEXOR XR) 37.5 MG 24 hr capsule Take 1 capsule (37.5 mg total) by mouth daily with breakfast. 30 capsule 0  . esomeprazole (NEXIUM) 20 MG capsule Take 1 capsule (20 mg total) by mouth daily before breakfast. OVC (Patient not taking: Reported on 11/16/2017)     No current facility-administered medications on file prior to visit.      Past Medical History:  Diagnosis Date  . Anxiety   . Depression   . GERD (gastroesophageal reflux disease)   . History of chicken pox   . Hyperlipidemia   . Migraine    complicated migraines with transient R side facial paralysis and aphasia  . Migraine headache with aura    hemiplegic migraine   . MVP (mitral valve prolapse)   . Osteopenia   . Reflex sympathetic dystrophy, unspecified 2011   R hand related to wrist fx, improved s/p nerve blocks  . Seizures (HCC)   . Stevens-Johnson disease [facility])    Past Surgical History:  Procedure Laterality Date  . ABDOMINAL HYSTERECTOMY     BSO  . CATARACT EXTRACTION    . HEEL SPUR SURGERY  12/1996   right  . ORIF DISTAL RADIUS FRACTURE  2004    Allergies  Allergen Reactions  . Cephalexin     REACTION: Levonne Spiller  . Epinephrine     REACTION: hypertensive reaction  . Erythromycin     REACTION: Rash  . Lactated Ringers     REACTION: cardiac symptoms  . Nortriptyline     Rapid heart rate  . Penicillins     REACTION: Rash  . Sulfamethoxazole-Trimethoprim     REACTION: Levonne Spiller  . Sulfonamide Derivatives   .  Trimethoprim     REACTION: stevens johnson syndrome  . Wellbutrin [Bupropion] Other (See Comments)    tremors   Family History  Problem Relation Age of Onset  . Leukemia Brother        CLL  . Arthritis Mother   . Arthritis Father   . Breast cancer Sister   . Arthritis Other   . Ovarian cancer Other   . Hyperlipidemia Other   . Hypertension Other   . Stroke Other     Social History   Socioeconomic History  . Marital status: Married    Spouse name: Not on file  . Number of children: Not on file  . Years of education: Not on file  . Highest education level: Not on file  Occupational History  . Not on file  Social Needs  . Financial resource strain: Not on file  . Food insecurity:    Worry: Not on file    Inability: Not on file  . Transportation needs:    Medical: Not on file    Non-medical: Not on file  Tobacco Use  . Smoking status: Current Every Day Smoker    Packs/day: 1.00    Types: Cigarettes  . Smokeless tobacco: Never Used  . Tobacco comment: Quit over 30 years ago( just needs it a bit now ) Stress   Substance and Sexual Activity  . Alcohol use: No  . Drug use: No  . Sexual  activity: Never  Lifestyle  . Physical activity:    Days per week: Not on file    Minutes per session: Not on file  . Stress: Not on file  Relationships  . Social connections:    Talks on phone: Not on file    Gets together: Not on file    Attends religious service: Not on file    Active member of club or organization: Not on file    Attends meetings of clubs or organizations: Not on file    Relationship status: Not on file  Other Topics Concern  . Not on file  Social History Narrative  . Not on file    Vitals:   11/16/17 1214  BP: 118/70  Pulse: 74  Resp: 12  Temp: 98.3 F (36.8 C)  SpO2: 95%   Body mass index is 25.15 kg/m.  Wt Readings from Last 3 Encounters:  11/16/17 144 lb 4 oz (65.4 kg)  10/13/17 145 lb 2 oz (65.8 kg)  10/05/17 145 lb (65.8 kg)    Physical Exam  Nursing note and vitals reviewed. Constitutional: She is oriented to person, place, and time. She appears well-developed and well-nourished. No distress.  HENT:  Head: Normocephalic and atraumatic.  Mouth/Throat: Oropharynx is clear and moist and mucous membranes are normal.  Eyes: Pupils are equal, round, and reactive to light. Conjunctivae are normal.  Cardiovascular: Normal rate. An irregular rhythm present.  No murmur heard. Pulses:      Dorsalis pedis pulses are 2+ on the right side, and 2+ on the left side.  Respiratory: Effort normal and breath sounds normal. No respiratory distress.  GI: Soft. She exhibits no mass. There is no hepatomegaly. There is no tenderness.  Musculoskeletal: She exhibits no edema.  + trigger points on mid and lower back,upper and lower extremities. No signs of synovitis.  Lymphadenopathy:    She has no cervical adenopathy.  Neurological: She is alert and oriented to person, place, and time. She has normal strength. No cranial nerve deficit. Gait normal.  Skin:  Skin is warm. No rash noted. No erythema.  Psychiatric: Her mood appears anxious. She expresses no  suicidal ideation.  Well groomed, good eye contact.    ASSESSMENT AND PLAN:  Ms. Eugenia was seen today for follow-up.   Orders Placed This Encounter  Procedures  . DG Chest 2 View  . Urinalysis, Routine w reflex microscopic  . ANA  . Sedimentation rate  . C-reactive protein  . CBC with Differential/Platelet  . Ambulatory referral to Rheumatology   Erythrocyte Sedimentation Rate     Component Value Date/Time   ESRSEDRATE 42 (H) 11/16/2017 1313   Lab Results  Component Value Date   CRP 1.0 11/16/2017   Lab Results  Component Value Date   WBC 8.4 11/16/2017   HGB 13.5 11/16/2017   HCT 39.9 11/16/2017   MCV 90.9 11/16/2017   PLT 334.0 11/16/2017    Gross hematuria  Possible etiologies discussed. She wants to hold on urologic evaluation for now. Instructed about warning signs.  -     Urinalysis, Routine w reflex microscopic  Fibromyalgia I still think this problem will explain most of her symptoms. She is not interested in trying gabapentin or other pharmacologic treatment. Continue low impact exercise and good sleep hygiene.   Polyarthralgia We discussed possible etiologies. ?  OA. Work-up in the past has been otherwise normal, including CRP and sedimentation rate.  She is reporting negative rheumatologic work-up years ago.  Rheumatology referral was placed again.   Chronic fatigue We discussed possible etiologies: Systemic illness, immunologic,endocrinology,sleep disorder, psychiatric/psychologic, infectious,medications side effects, and idiopathic.       For now we will hold on sleep study.   Further recommendations will be given according to lab results.   Anxiety and depression I think this problem is contributing to her multiple complaints but she does not think so. She denies depressed mood. She will continue Celexa 40 mg daily.  Chronic cough Possible etiology discussed. ?  COPD, allergies, GERD among some. Strongly recommend smoking  cessation but she is not interested in doing so. Further recommendation will be given according to CXR results.  Irregular heart rate She is asymptomatic. We had discussed this in the past, she has been reassured but she has also been offered cardiology referral. She has EKGs in 2014 that showed sinus arrhythmia.  EKG in 12/2016 with no significant changes. Today she states that she prefers to hold on cardiologist evaluation given the fact she is not symptomatic. She was instructed about warning signs.   12:25 pm-1:12 pm face to face OV. > 50% was dedicated to discussion of differential Dx, prognosis, treatment options, and side effects of medications.   Apoorva Bugay G. [place], MD  [facility]. [place] office.

## 2017-11-16 NOTE — Assessment & Plan Note (Signed)
I think this problem is contributing to her multiple complaints but she does not think so. She denies depressed mood. She will continue Celexa 40 mg daily.

## 2017-11-16 NOTE — Assessment & Plan Note (Signed)
She is asymptomatic. We had discussed this in the past, she has been reassured but she has also been offered cardiology referral. She has EKGs in 2014 that showed sinus arrhythmia.  EKG in 12/2016 with no significant changes. Today she states that she prefers to hold on cardiologist evaluation given the fact she is not symptomatic. She was instructed about warning signs.

## 2017-11-16 NOTE — Assessment & Plan Note (Signed)
We discussed possible etiologies: Systemic illness, immunologic,endocrinology,sleep disorder, psychiatric/psychologic, infectious,medications side effects, and idiopathic.       For now we will hold on sleep study.   Further recommendations will be given according to lab results.

## 2017-11-16 NOTE — Assessment & Plan Note (Addendum)
We discussed possible etiologies. ?  OA. Work-up in the past has been otherwise normal, including CRP and sedimentation rate.  She is reporting negative rheumatologic work-up years ago.  Rheumatology referral was placed again.

## 2017-11-18 LAB — ANA: ANA: NEGATIVE

## 2017-11-19 ENCOUNTER — Ambulatory Visit: Payer: No Typology Code available for payment source | Admitting: Psychology

## 2017-11-23 DIAGNOSIS — M79642 Pain in left hand: Secondary | ICD-10-CM | POA: Diagnosis not present

## 2017-11-23 DIAGNOSIS — M19041 Primary osteoarthritis, right hand: Secondary | ICD-10-CM | POA: Diagnosis not present

## 2017-11-23 DIAGNOSIS — M79641 Pain in right hand: Secondary | ICD-10-CM | POA: Diagnosis not present

## 2017-11-23 DIAGNOSIS — M25571 Pain in right ankle and joints of right foot: Secondary | ICD-10-CM | POA: Diagnosis not present

## 2017-11-23 DIAGNOSIS — M25572 Pain in left ankle and joints of left foot: Secondary | ICD-10-CM | POA: Diagnosis not present

## 2017-11-23 DIAGNOSIS — R700 Elevated erythrocyte sedimentation rate: Secondary | ICD-10-CM | POA: Diagnosis not present

## 2017-11-23 DIAGNOSIS — M19042 Primary osteoarthritis, left hand: Secondary | ICD-10-CM | POA: Diagnosis not present

## 2017-11-23 DIAGNOSIS — M1990 Unspecified osteoarthritis, unspecified site: Secondary | ICD-10-CM | POA: Diagnosis not present

## 2017-11-23 DIAGNOSIS — M19072 Primary osteoarthritis, left ankle and foot: Secondary | ICD-10-CM | POA: Diagnosis not present

## 2017-11-23 DIAGNOSIS — M810 Age-related osteoporosis without current pathological fracture: Secondary | ICD-10-CM | POA: Diagnosis not present

## 2017-11-23 DIAGNOSIS — M19071 Primary osteoarthritis, right ankle and foot: Secondary | ICD-10-CM | POA: Diagnosis not present

## 2017-11-23 DIAGNOSIS — M069 Rheumatoid arthritis, unspecified: Secondary | ICD-10-CM | POA: Diagnosis not present

## 2017-11-23 DIAGNOSIS — M353 Polymyalgia rheumatica: Secondary | ICD-10-CM | POA: Diagnosis not present

## 2017-12-19 NOTE — Progress Notes (Signed)
NEUROLOGY FOLLOW UP OFFICE NOTE  Lacey Salas [id number]  HISTORY OF PRESENT ILLNESS: Lacey Salas is a 72 year old right-handed woman with history of anxiety, depression, migraine, complex regional pain syndrome of right hand, hyperlipidemia, osteopenia, MVP and Stevens-Johnson syndrome who follows up for complex migraines (hemiplegic migraine and ocular migraine).  UPDATE: No migraines in several months. In July, she had a tooth extracted.  Since then she has had a paroxysmal shooting pain from the site of the tooth extraction on the right up to the right eye.    Current NSAIDS:  ASA 81mg  daily Current analgesics:  Tylenol Current triptans:  no Current anti-emetic:  no Current muscle relaxants:  no Current anti-anxiolytic:  lorazepam as needed (rarely takes) Current sleep aide:  no Current Antihypertensive medications:  atenolol 25mg  Current Antidepressant medications:  citalopram 40mg  Current Anticonvulsant medications:  no Current Vitamins/Herbal/Supplements:  no Current Antihistamines/Decongestants:  no Other therapy:  no  Caffeine:  no Alcohol:  no Smoker:  Quit 34 years ago but started again due to recent tragedy Depression:  Significant depression and anxiety.  She reports numbness, tingling and burning in the feet for the past 3 months.  No weakness or radicular back pain.  Sed rate mildly elevated at 42.  CRP and ANA were normal.  She is seeing rheumatology.  SPELLS: She has been evaluated by several neurologists and headache specialists, such as Dr. , Dr. , Dr. , Dr. Avie Echevaria, and Dr. Fonnie Jarvis. Occurs spontaneously, including while driving. Onset:Has history of typical migraines (with headache, nausea and vomiting) since age 81.This subsided and started to have these current spells starting in her early 43s.70% of the time it is accompanied by headache. Location:holocephalic Quality:Non-throbbing  pressure Initial intensity:5/10 Aura:no Prodrome:Indescribable "foreboding" feeling Associated symptoms:Migraines typically present as TIA-like symptoms, including right-sided weakness involving face, arm and leg, photophobia, word-finding difficulties, difficulty getting words out, slurred speech, staggering gait.She is conscious and alert but doesn't feel like she can function during the spell.Following episodes, she is fatigued and will sleep for 2-3 hours.On rare occasions, accompanied by field cut. Initial duration:30 minutes Initial frequency:Varies.Usually once a month but may be 3 times a week. Triggers/exacerbating factors:Chemical smells, some perfumes, bright lights Relieving factors:Laying down Activity:Cannot function  MRI/MRA of head from 06/15/08 were unremarkable. EEGs from 11/02/12 and 09/27/13 were normal.  Past abortive therapy:none Past preventative therapy:Amitriptyline (initially for complex regional pain syndrome), venlafaxine (racing thoughts), Sinequan, onriboflavin  Family history of headache:Maternal grandmother, sister   PAST MEDICAL HISTORY: Past Medical History:  Diagnosis Date  . Anxiety   . Depression   . GERD (gastroesophageal reflux disease)   . History of chicken pox   . Hyperlipidemia   . Migraine    complicated migraines with transient R side facial paralysis and aphasia  . Migraine headache with aura    hemiplegic migraine  . MVP (mitral valve prolapse)   . Osteopenia   . Reflex sympathetic dystrophy, unspecified 2011   R hand related to wrist fx, improved s/p nerve blocks  . Seizures (HCC)   . Stevens-Johnson disease (HCC)     MEDICATIONS: Current Outpatient Medications on File Prior to Visit  Medication Sig Dispense Refill  . Acetaminophen (TYLENOL 8 HOUR PO) Take by mouth daily.    11/04/12 aspirin EC 81 MG tablet Take 81 mg by mouth every 4 (four) hours as needed for mild pain.    09/29/13 atenolol (TENORMIN) 25  MG tablet Take 1 tablet (25 mg  total) by mouth daily. 90 tablet 2  . citalopram (CELEXA) 40 MG tablet   0  . esomeprazole (NEXIUM) 20 MG capsule Take 1 capsule (20 mg total) by mouth daily before breakfast. OVC (Patient not taking: Reported on 11/16/2017)    . venlafaxine XR (EFFEXOR XR) 37.5 MG 24 hr capsule Take 1 capsule (37.5 mg total) by mouth daily with breakfast. 30 capsule 0   No current facility-administered medications on file prior to visit.     ALLERGIES: Allergies  Allergen Reactions  . Cephalexin     REACTION: Lacey Salas  . Epinephrine     REACTION: hypertensive reaction  . Erythromycin     REACTION: Rash  . Lactated Ringers     REACTION: cardiac symptoms  . Nortriptyline     Rapid heart rate  . Penicillins     REACTION: Rash  . Sulfamethoxazole-Trimethoprim     REACTION: Lacey Salas  . Sulfonamide Derivatives   . Trimethoprim     REACTION: stevens johnson syndrome  . Wellbutrin [Bupropion] Other (See Comments)    tremors    FAMILY HISTORY: Family History  Problem Relation Age of Onset  . Leukemia Brother        CLL  . Arthritis Mother   . Arthritis Father   . Breast cancer Sister   . Arthritis Other   . Ovarian cancer Other   . Hyperlipidemia Other   . Hypertension Other   . Stroke Other    SOCIAL HISTORY: Social History   Socioeconomic History  . Marital status: Married    Spouse name: Not on file  . Number of children: Not on file  . Years of education: Not on file  . Highest education level: Not on file  Occupational History  . Not on file  Social Needs  . Financial resource strain: Not on file  . Food insecurity:    Worry: Not on file    Inability: Not on file  . Transportation needs:    Medical: Not on file    Non-medical: Not on file  Tobacco Use  . Smoking status: Current Every Day Smoker    Packs/day: 1.00    Types: Cigarettes  . Smokeless tobacco: Never Used  . Tobacco comment: Quit over 30 years ago( just needs it a  bit now ) Stress   Substance and Sexual Activity  . Alcohol use: No  . Drug use: No  . Sexual activity: Never  Lifestyle  . Physical activity:    Days per week: Not on file    Minutes per session: Not on file  . Stress: Not on file  Relationships  . Social connections:    Talks on phone: Not on file    Gets together: Not on file    Attends religious service: Not on file    Active member of club or organization: Not on file    Attends meetings of clubs or organizations: Not on file    Relationship status: Not on file  . Intimate partner violence:    Fear of current or ex partner: Not on file    Emotionally abused: Not on file    Physically abused: Not on file    Forced sexual activity: Not on file  Other Topics Concern  . Not on file  Social History Narrative  . Not on file    REVIEW OF SYSTEMS: Constitutional: No fevers, chills, or sweats, no generalized fatigue, change in appetite Eyes: No visual changes, double vision, eye pain  Ear, nose and throat: No hearing loss, ear pain, nasal congestion, sore throat Cardiovascular: No chest pain, palpitations Respiratory:  No shortness of breath at rest or with exertion, wheezes GastrointestinaI: No nausea, vomiting, diarrhea, abdominal pain, fecal incontinence Genitourinary:  No dysuria, urinary retention or frequency Musculoskeletal:  No neck pain, back pain Integumentary: No rash, pruritus, skin lesions Neurological: as above Psychiatric: No depression, insomnia, anxiety Endocrine: No palpitations, fatigue, diaphoresis, mood swings, change in appetite, change in weight, increased thirst Hematologic/Lymphatic:  No purpura, petechiae. Allergic/Immunologic: no itchy/runny eyes, nasal congestion, recent allergic reactions, rashes  PHYSICAL EXAM: Blood pressure 110/74, pulse 67, height 5' 3.5" (1.613 m), weight 145 lb (65.8 kg), SpO2 96 %. General: No acute distress.  Patient appears well-groomed. Head:   Normocephalic/atraumatic Eyes:  Fundi examined but not visualized Neck: supple, no paraspinal tenderness, full range of motion Heart:  Regular rate and rhythm Lungs:  Clear to auscultation bilaterally Back: No paraspinal tenderness Neurological Exam: alert and oriented to person, place, and time. Attention span and concentration intact, recent and remote memory intact, fund of knowledge intact.  Speech fluent and not dysarthric, language intact.  CN II-XII intact. Bulk and tone normal, muscle strength 5/5 throughout.  Sensation to light touch, temperature and vibration intact.  Deep tendon reflexes 3+ left patellar, otherwise 2+ throughout, toes downgoing.  Finger to nose and heel to shin testing intact.  Gait normal, Romberg negative.  IMPRESSION: 1.  Migraine with aura, without status migrainosus, not intractable/hemiplegic migraine 2.  Ocular migraine 3.  Atypical facial pain/neuralgia from tooth extraction 4.  Neuropathy  PLAN: 1.  Continue atenolol 25mg  daily 2.  Limit use of pain relievers to no more than 2 days out of week to prevent risk of rebound or medication-overuse headache. 3.  Keep headache diary 4.  Check other causes of neuropathy:  B12, TSH, SPEP/IFE 5.  Defers gabapentin or Lyrica for treatment of neuralgia 6.  Follow up in 6 months.  Shon Millet, DO  CC:  Betty [place], MD

## 2017-12-20 ENCOUNTER — Other Ambulatory Visit (INDEPENDENT_AMBULATORY_CARE_PROVIDER_SITE_OTHER): Payer: Medicare Other

## 2017-12-20 ENCOUNTER — Ambulatory Visit: Payer: Medicare Other | Admitting: Neurology

## 2017-12-20 ENCOUNTER — Encounter: Payer: Self-pay | Admitting: Neurology

## 2017-12-20 VITALS — BP 110/74 | HR 67 | Ht 63.5 in | Wt 145.0 lb

## 2017-12-20 DIAGNOSIS — G501 Atypical facial pain: Secondary | ICD-10-CM

## 2017-12-20 DIAGNOSIS — G43109 Migraine with aura, not intractable, without status migrainosus: Secondary | ICD-10-CM

## 2017-12-20 DIAGNOSIS — R6889 Other general symptoms and signs: Secondary | ICD-10-CM

## 2017-12-20 DIAGNOSIS — G43409 Hemiplegic migraine, not intractable, without status migrainosus: Secondary | ICD-10-CM

## 2017-12-20 DIAGNOSIS — G629 Polyneuropathy, unspecified: Secondary | ICD-10-CM

## 2017-12-20 NOTE — Patient Instructions (Addendum)
1.  Continue atenolol  2.  We will check other causes of neuropathy:  B12, TSH, SPEP/IFE 3.  Follow up in 6 months.  Your provider has requested that you have labwork completed today. Please go to [facility] ([id number]) on the second floor of this building before leaving the office today. You do not need to check in. If you are not called within 15 minutes please check with the front desk.

## 2017-12-22 LAB — PROTEIN ELECTROPHORESIS, SERUM
ALBUMIN ELP: 4.2 g/dL (ref 3.8–4.8)
ALPHA 1: 0.3 g/dL (ref 0.2–0.3)
ALPHA 2: 0.8 g/dL (ref 0.5–0.9)
BETA 2: 0.5 g/dL (ref 0.2–0.5)
Beta Globulin: 0.5 g/dL (ref 0.4–0.6)
GAMMA GLOBULIN: 0.8 g/dL (ref 0.8–1.7)
Total Protein: 7 g/dL (ref 6.1–8.1)

## 2017-12-22 LAB — IMMUNOFIXATION ELECTROPHORESIS
IgG (Immunoglobin G), Serum: 1002 mg/dL (ref 600–1540)
IgM, Serum: 34 mg/dL — ABNORMAL LOW (ref 50–300)
Immunofix Electr Int: NOT DETECTED
Immunoglobulin A: 312 mg/dL (ref 20–320)

## 2017-12-22 LAB — VITAMIN B12: Vitamin B-12: 610 pg/mL (ref 200–1100)

## 2017-12-22 LAB — TSH: TSH: 1.89 mIU/L (ref 0.40–4.50)

## 2017-12-23 ENCOUNTER — Telehealth: Payer: Self-pay | Admitting: Neurology

## 2017-12-23 NOTE — Telephone Encounter (Signed)
Patient called back and was made aware of lab results.

## 2017-12-23 NOTE — Telephone Encounter (Signed)
-----   Message from Drema Dallas, DO sent at 12/23/2017  7:16 AM EDT ----- Labs are unremarkable

## 2017-12-23 NOTE — Telephone Encounter (Signed)
Left message on machine for patient to call back.

## 2018-02-08 ENCOUNTER — Other Ambulatory Visit: Payer: Self-pay | Admitting: Neurology

## 2018-04-06 DIAGNOSIS — M1990 Unspecified osteoarthritis, unspecified site: Secondary | ICD-10-CM | POA: Diagnosis not present

## 2018-04-06 DIAGNOSIS — L4050 Arthropathic psoriasis, unspecified: Secondary | ICD-10-CM | POA: Diagnosis not present

## 2018-04-06 DIAGNOSIS — M79643 Pain in unspecified hand: Secondary | ICD-10-CM | POA: Diagnosis not present

## 2018-04-06 DIAGNOSIS — M353 Polymyalgia rheumatica: Secondary | ICD-10-CM | POA: Diagnosis not present

## 2018-06-21 ENCOUNTER — Ambulatory Visit: Payer: Self-pay | Admitting: Neurology

## 2018-08-01 ENCOUNTER — Other Ambulatory Visit: Payer: Self-pay | Admitting: Family Medicine

## 2018-08-05 ENCOUNTER — Other Ambulatory Visit: Payer: Self-pay | Admitting: Neurology

## 2018-08-09 ENCOUNTER — Telehealth: Payer: Self-pay | Admitting: Neurology

## 2018-08-09 ENCOUNTER — Other Ambulatory Visit: Payer: Self-pay | Admitting: Neurology

## 2018-08-09 NOTE — Telephone Encounter (Signed)
Patient called regarding her having some new symptoms. She said she has noticed that she is loosing sensations. She felt that she blacked out and eyes were crossed. She was last seen in 12/2017. She would like to follow up with Dr. Everlena Cooper. Please Call. Thanks

## 2018-08-10 NOTE — Telephone Encounter (Signed)
If she is still currently having symptoms, then I would like to see her in office.  If symptoms have since resolved, then e-visit is fine.

## 2018-08-11 DIAGNOSIS — M353 Polymyalgia rheumatica: Secondary | ICD-10-CM | POA: Diagnosis not present

## 2018-08-11 DIAGNOSIS — L4050 Arthropathic psoriasis, unspecified: Secondary | ICD-10-CM | POA: Diagnosis not present

## 2018-08-11 DIAGNOSIS — M069 Rheumatoid arthritis, unspecified: Secondary | ICD-10-CM | POA: Diagnosis not present

## 2018-08-11 DIAGNOSIS — M1990 Unspecified osteoarthritis, unspecified site: Secondary | ICD-10-CM | POA: Diagnosis not present

## 2018-08-11 NOTE — Telephone Encounter (Signed)
Called [facility] advising Pt to return call

## 2018-08-11 NOTE — Telephone Encounter (Signed)
Called and spoke with Pt. She is still experiencing symptoms. Pt to come in for appt

## 2018-10-10 ENCOUNTER — Ambulatory Visit: Payer: Self-pay

## 2018-10-18 ENCOUNTER — Encounter: Payer: Self-pay | Admitting: Family Medicine

## 2018-10-18 ENCOUNTER — Ambulatory Visit (INDEPENDENT_AMBULATORY_CARE_PROVIDER_SITE_OTHER): Payer: Medicare Other | Admitting: Family Medicine

## 2018-10-18 VITALS — BP 127/74 | HR 75

## 2018-10-18 DIAGNOSIS — K219 Gastro-esophageal reflux disease without esophagitis: Secondary | ICD-10-CM | POA: Diagnosis not present

## 2018-10-18 DIAGNOSIS — F419 Anxiety disorder, unspecified: Secondary | ICD-10-CM

## 2018-10-18 DIAGNOSIS — F32A Depression, unspecified: Secondary | ICD-10-CM

## 2018-10-18 DIAGNOSIS — F329 Major depressive disorder, single episode, unspecified: Secondary | ICD-10-CM

## 2018-10-18 DIAGNOSIS — R5382 Chronic fatigue, unspecified: Secondary | ICD-10-CM

## 2018-10-18 DIAGNOSIS — M8580 Other specified disorders of bone density and structure, unspecified site: Secondary | ICD-10-CM

## 2018-10-18 DIAGNOSIS — M2550 Pain in unspecified joint: Secondary | ICD-10-CM | POA: Diagnosis not present

## 2018-10-18 DIAGNOSIS — E559 Vitamin D deficiency, unspecified: Secondary | ICD-10-CM

## 2018-10-18 DIAGNOSIS — E785 Hyperlipidemia, unspecified: Secondary | ICD-10-CM

## 2018-10-18 NOTE — Patient Instructions (Signed)
   Vitamin D3:  5,000 IU daily  Vitamin K2:  100 mcg daily  Magnesium:  200-400 mg daily  Zinc:  20-30 mg daily

## 2018-10-18 NOTE — Progress Notes (Signed)
Office Visit Note   Patient: Lacey Salas           Date of Birth: Jan 11, 1946           MRN: [id number] Visit Date: 10/18/2018 Requested by: [place], Betty G, [address],  [place] [address] PCP: Eunice Blase, MD  Subjective: Chief Complaint  Patient presents with  . reestablish primary care    HPI: She is here to establish care.  I have seen her intermittently for close to 20 years.  She has a history of RSD affecting her right arm.  She has done very well from that standpoint.  Migraine headaches have been pretty well controlled.  She has longstanding history of fibromyalgia type symptoms as well as polyarthralgia.  She has been virtually pain-free since starting Plaquenil.  She is also on prednisone 10 mg daily but will be weaning from that in the next couple months.  5 or 6 years ago she lost her sister, mother, and other relatives.  At the same time her husband left her.  He was extremely stressful for her and she developed anxiety and depression symptoms which are now pretty well controlled with Celexa.  She has hyperlipidemia managed without medication.  She has osteopenia and a history of vitamin D deficiency.  She has GERD which is treated with Nexium, she does not take it on a daily basis.                ROS: Denies fevers or chills.  All other systems were reviewed and are negative.  Objective: Vital Signs: BP 127/74 (BP Location: Left Arm, Patient Position: Sitting, Cuff Size: Normal)   Pulse 75   Physical Exam:  General:  Alert and oriented, in no acute distress. Pulm:  Breathing unlabored. Psy:  Normal mood, congruent affect. Skin: No visible rash. HEENT:  [place]/AT, PERRLA, EOM Full, no nystagmus.  Funduscopic examination within normal limits.  No conjunctival erythema.  Tympanic membranes are pearly gray with normal landmarks.  External ear canals are normal.  Nasal passages are clear.  Oropharynx is clear.  No significant lymphadenopathy.   No thyromegaly or nodules.  2+ carotid pulses without bruits. CV: Regular rate and rhythm without murmurs, rubs, or gallops.  No peripheral edema.  2+ radial and posterior tibial pulses. Lungs: Clear to auscultation throughout with no wheezing or areas of consolidation.    Imaging: None today  Assessment & Plan: 1.  Fibromyalgia and polyarthralgia, currently well controlled.  2.  Anxiety and depression, doing better with medication.  3.  RSD right upper extremity, minimally symptomatic.  4.  Vitamin D deficiency, currently taking 4000 IU daily.  5.  Osteopenia - We will add vitamin K2, magnesium.  I will see her back in about 6 months.  She will call for medicine refills when needed.     Procedures: No procedures performed  No notes on file     PMFS History: Patient Active Problem List   Diagnosis Date Noted  . Chronic cough 11/16/2017  . Chronic fatigue 11/16/2017  . Irregular heart rate 11/16/2017  . Polyarthralgia 02/24/2017  . Fibromyalgia 02/24/2017  . RSD upper limb 09/02/2015  . Hemiplegic migraine without status migrainosus, not intractable 12/05/2013  . HYPERSOMNIA 04/04/2010  . Hyperlipidemia 10/12/2006  . Anxiety and depression 10/11/2006  . Adjustment disorder with mixed anxiety and depressed mood 10/11/2006  . GERD 10/11/2006  . Disorder of bone and cartilage 10/11/2006  . STEVENS-JOHNSON SYNDROME, HX OF 10/11/2006  Past Medical History:  Diagnosis Date  . Anxiety   . Depression   . GERD (gastroesophageal reflux disease)   . History of chicken pox   . Hyperlipidemia   . Migraine    complicated migraines with transient R side facial paralysis and aphasia  . Migraine headache with aura    hemiplegic migraine  . MVP (mitral valve prolapse)   . Osteopenia   . Reflex sympathetic dystrophy, unspecified 2011   R hand related to wrist fx, improved s/p nerve blocks  . Seizures (HCC)   . Stevens-Johnson disease (HCC)     Family History  Problem  Relation Age of Onset  . Leukemia Brother        CLL  . Arthritis Mother   . Arthritis Father   . Breast cancer Sister   . Arthritis Other   . Ovarian cancer Other   . Hyperlipidemia Other   . Hypertension Other   . Stroke Other     Past Surgical History:  Procedure Laterality Date  . ABDOMINAL HYSTERECTOMY     BSO  . CATARACT EXTRACTION    . HEEL SPUR SURGERY  12/1996   right  . ORIF DISTAL RADIUS FRACTURE  2004   Social History   Occupational History  . Not on file  Tobacco Use  . Smoking status: Current Every Day Smoker    Packs/day: 1.00    Types: Cigarettes  . Smokeless tobacco: Never Used  . Tobacco comment: Quit over 30 years ago( just needs it a bit now ) Stress   Substance and Sexual Activity  . Alcohol use: No  . Drug use: No  . Sexual activity: Never
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
## 2018-10-20 ENCOUNTER — Ambulatory Visit: Payer: Medicare Other | Admitting: Neurology

## 2018-10-20 ENCOUNTER — Encounter: Payer: Self-pay | Admitting: Neurology

## 2018-10-20 ENCOUNTER — Other Ambulatory Visit: Payer: Self-pay

## 2018-10-20 VITALS — BP 116/63 | HR 81 | Temp 98.7°F | Ht 63.5 in | Wt 154.0 lb

## 2018-10-20 DIAGNOSIS — H539 Unspecified visual disturbance: Secondary | ICD-10-CM | POA: Diagnosis not present

## 2018-10-20 DIAGNOSIS — R292 Abnormal reflex: Secondary | ICD-10-CM

## 2018-10-20 DIAGNOSIS — G253 Myoclonus: Secondary | ICD-10-CM

## 2018-10-20 DIAGNOSIS — G43109 Migraine with aura, not intractable, without status migrainosus: Secondary | ICD-10-CM

## 2018-10-20 DIAGNOSIS — G43409 Hemiplegic migraine, not intractable, without status migrainosus: Secondary | ICD-10-CM

## 2018-10-20 NOTE — Patient Instructions (Addendum)
1.  MRI of brain and cervical spine 2.  Follow up in 9 months  We have sent a referral to [facility] for your MRI's and they will call you directly to schedule your appointment.   They are located at [place]. If you need to contact them directly, or they have not contacted you within 5-7 days to schedule an appointment, please call [phone].

## 2018-10-24 DIAGNOSIS — H04123 Dry eye syndrome of bilateral lacrimal glands: Secondary | ICD-10-CM | POA: Diagnosis not present

## 2018-10-24 DIAGNOSIS — H5310 Unspecified subjective visual disturbances: Secondary | ICD-10-CM | POA: Diagnosis not present

## 2018-10-25 DIAGNOSIS — M069 Rheumatoid arthritis, unspecified: Secondary | ICD-10-CM | POA: Diagnosis not present

## 2018-10-25 DIAGNOSIS — M353 Polymyalgia rheumatica: Secondary | ICD-10-CM | POA: Diagnosis not present

## 2018-10-25 DIAGNOSIS — L4050 Arthropathic psoriasis, unspecified: Secondary | ICD-10-CM | POA: Diagnosis not present

## 2018-10-25 DIAGNOSIS — M1990 Unspecified osteoarthritis, unspecified site: Secondary | ICD-10-CM | POA: Diagnosis not present

## 2018-11-18 DIAGNOSIS — L4050 Arthropathic psoriasis, unspecified: Secondary | ICD-10-CM | POA: Diagnosis not present

## 2018-11-18 DIAGNOSIS — M79671 Pain in right foot: Secondary | ICD-10-CM | POA: Diagnosis not present

## 2018-11-19 ENCOUNTER — Other Ambulatory Visit: Payer: Self-pay

## 2018-11-19 ENCOUNTER — Ambulatory Visit
Admission: RE | Admit: 2018-11-19 | Discharge: 2018-11-19 | Disposition: A | Discharge status: Home / Self Care | Payer: Medicare Other | Source: Ambulatory Visit | Attending: Neurology | Admitting: Neurology

## 2018-11-19 DIAGNOSIS — R292 Abnormal reflex: Secondary | ICD-10-CM

## 2018-11-19 DIAGNOSIS — H539 Unspecified visual disturbance: Secondary | ICD-10-CM

## 2018-11-19 DIAGNOSIS — M50223 Other cervical disc displacement at C6-C7 level: Secondary | ICD-10-CM | POA: Diagnosis not present

## 2018-11-23 ENCOUNTER — Telehealth: Payer: Self-pay

## 2018-11-23 NOTE — Telephone Encounter (Signed)
-----   Message from Pieter Partridge, DO sent at 11/22/2018  3:22 PM EDT ----- MRI shows some age-related changes in the brain but nothing to explain her symptoms.  The MRI of cervical spine is unremarkable.

## 2018-11-23 NOTE — Progress Notes (Signed)
Called no answer

## 2018-11-23 NOTE — Telephone Encounter (Signed)
No answer at 1007  

## 2018-11-28 ENCOUNTER — Telehealth: Payer: Self-pay | Admitting: Neurology

## 2018-11-28 NOTE — Telephone Encounter (Signed)
Called patient she was informed of results  Pt states Eye doctor also did not see anything wrong with her eyes

## 2018-11-28 NOTE — Telephone Encounter (Signed)
----- 

## 2018-11-28 NOTE — Telephone Encounter (Signed)
Patient called to see if her MRI results are back yet from 11/19/2018.

## 2018-11-29 ENCOUNTER — Telehealth: Payer: Self-pay

## 2018-11-29 DIAGNOSIS — M1990 Unspecified osteoarthritis, unspecified site: Secondary | ICD-10-CM | POA: Diagnosis not present

## 2018-11-29 DIAGNOSIS — M353 Polymyalgia rheumatica: Secondary | ICD-10-CM | POA: Diagnosis not present

## 2018-11-29 DIAGNOSIS — M069 Rheumatoid arthritis, unspecified: Secondary | ICD-10-CM | POA: Diagnosis not present

## 2018-11-29 DIAGNOSIS — L4050 Arthropathic psoriasis, unspecified: Secondary | ICD-10-CM | POA: Diagnosis not present

## 2018-11-29 NOTE — Telephone Encounter (Signed)
Left message on voicemail.

## 2018-12-05 ENCOUNTER — Ambulatory Visit: Payer: Medicare Other

## 2018-12-06 DIAGNOSIS — D485 Neoplasm of uncertain behavior of skin: Secondary | ICD-10-CM | POA: Diagnosis not present

## 2018-12-06 DIAGNOSIS — L821 Other seborrheic keratosis: Secondary | ICD-10-CM | POA: Diagnosis not present

## 2018-12-06 DIAGNOSIS — D1801 Hemangioma of skin and subcutaneous tissue: Secondary | ICD-10-CM | POA: Diagnosis not present

## 2018-12-06 DIAGNOSIS — L853 Xerosis cutis: Secondary | ICD-10-CM | POA: Diagnosis not present

## 2018-12-11 IMAGING — DX DG LUMBAR SPINE COMPLETE 4+V
5 series · 5 of 5 positions shown · non-contrast
Comparison: 02/17/2012

CLINICAL DATA: Fall 1 month ago.

EXAM:
LUMBAR SPINE - COMPLETE 4+ VIEW

[l-spine ap]
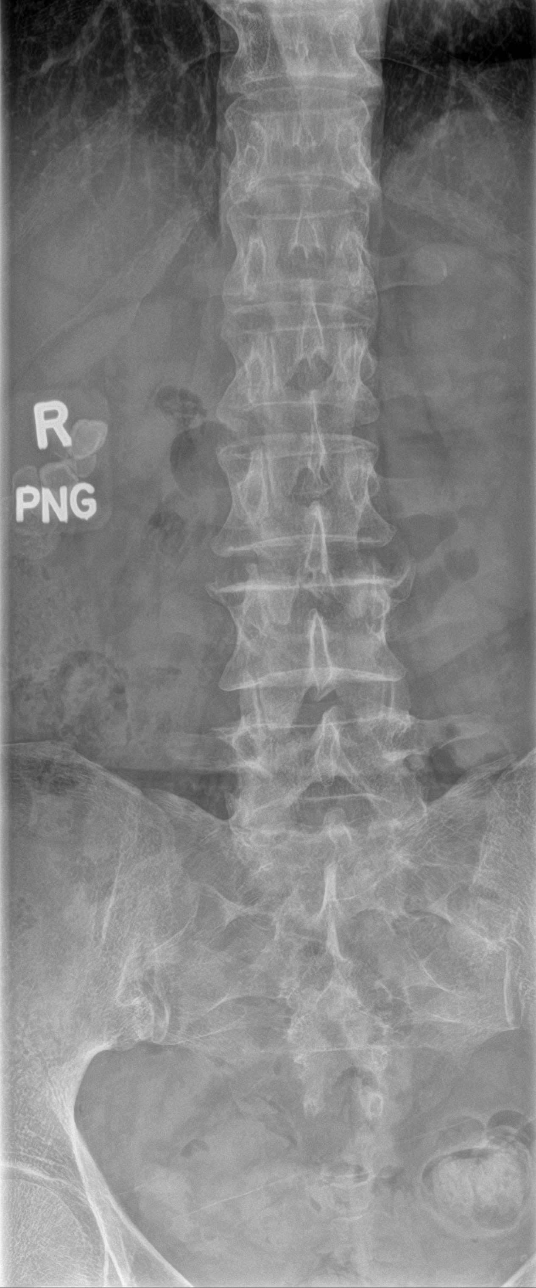

[l-spine obl (1 of 2)]
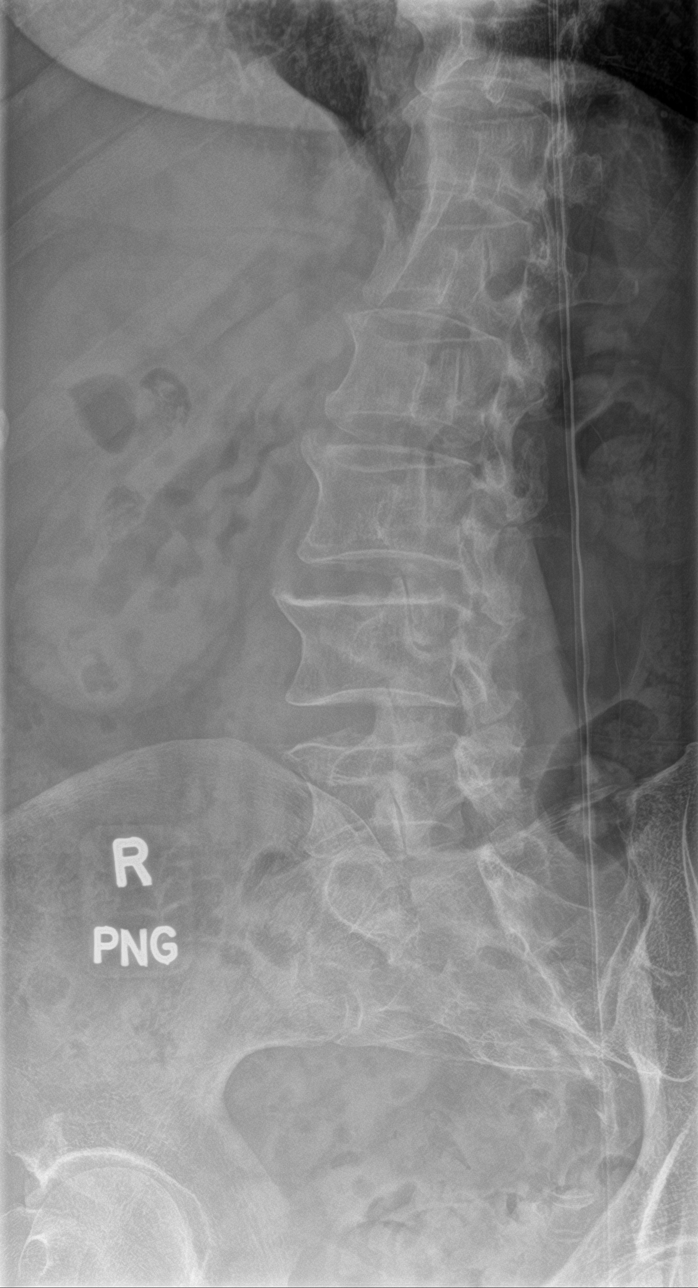

[l-spine obl (2 of 2)]
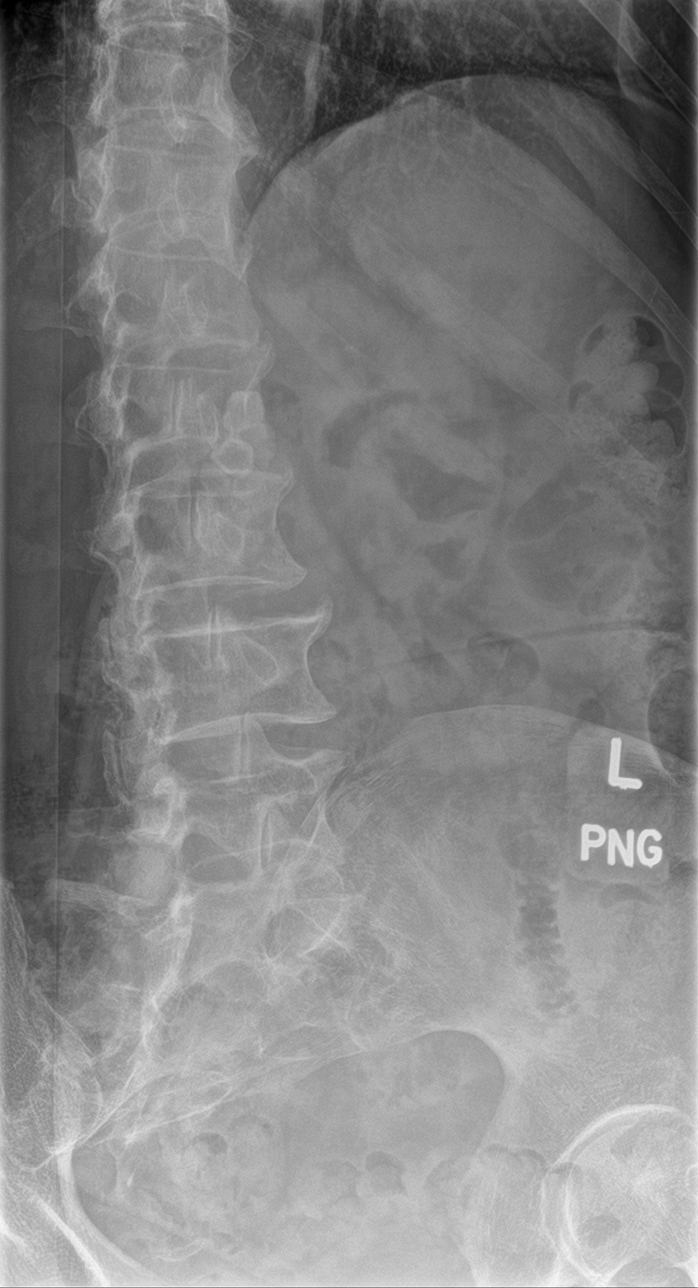

[l-spine lat]
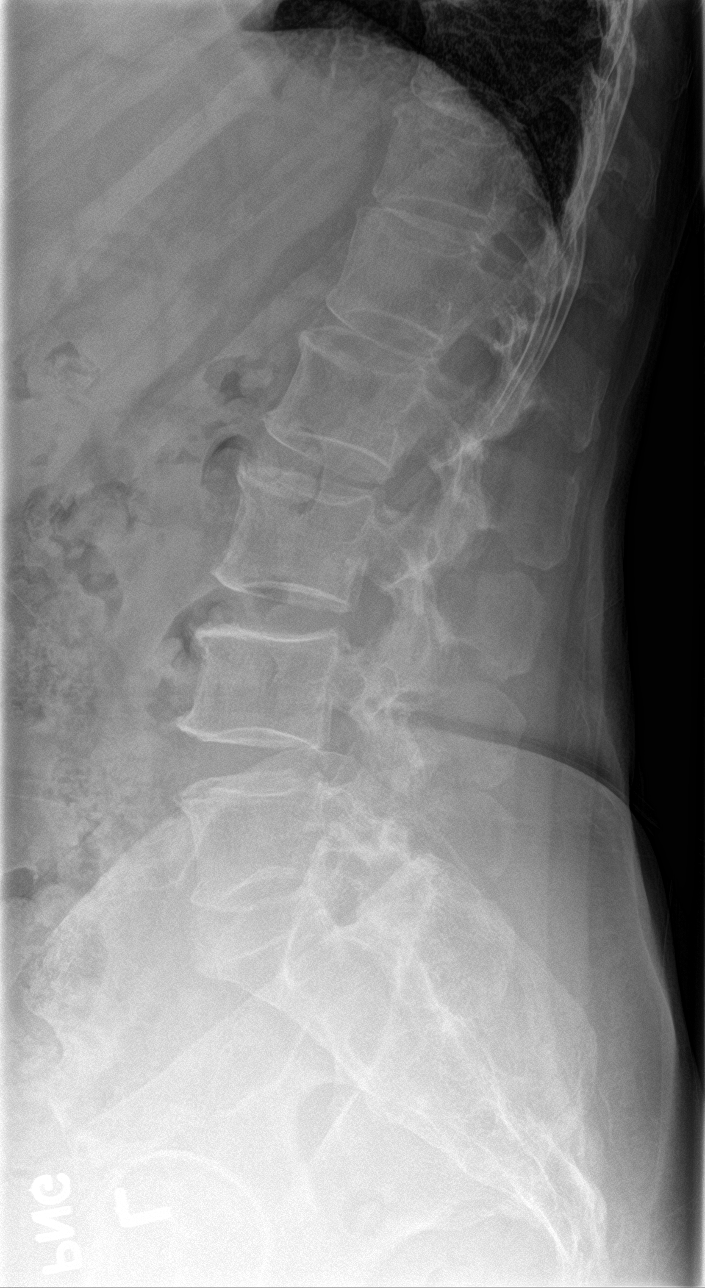

[l-spine spot]
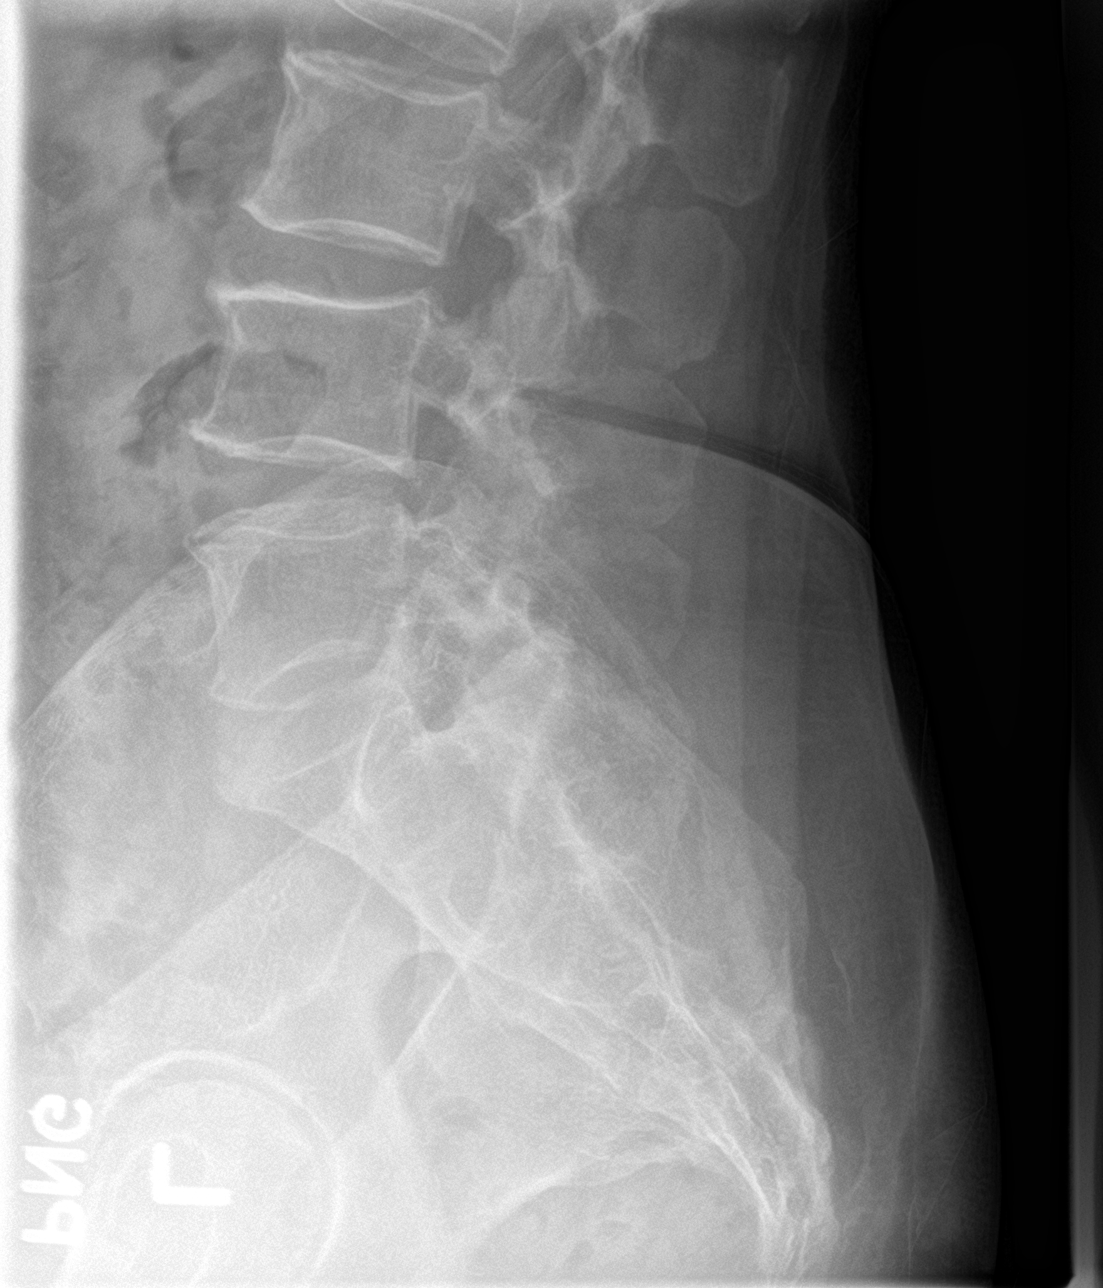

[5 of 5 positions shown; findings below may reference images not displayed]

FINDINGS: Normal alignment. No fracture. Degenerative facet disease from L3-4
through L5-S1. SI joints are symmetric and unremarkable.
IMPRESSION: No acute bony abnormality. Mild degenerative facet disease in the
lower lumbar spine.

## 2018-12-15 NOTE — Telephone Encounter (Signed)
Sending to clinical staff for review: Okay to sign/close encounter or is further follow up needed? ° °

## 2019-01-16 DIAGNOSIS — Z0120 Encounter for dental examination and cleaning without abnormal findings: Secondary | ICD-10-CM | POA: Diagnosis not present

## 2019-02-17 ENCOUNTER — Other Ambulatory Visit: Payer: Self-pay | Admitting: Neurology

## 2019-02-17 NOTE — Telephone Encounter (Signed)
Requested Prescriptions   Pending Prescriptions Disp Refills  . citalopram (CELEXA) 40 MG tablet [Pharmacy Med Name: CITALOPRAM HBR 40 MG TABLET] 90 tablet 1    Sig: TAKE 1 TABLET BY MOUTH EVERY DAY   Rx last filled: 08/09/18 #90 1 refill  Pt last seen: 10/20/18  Follow up appt scheduled:07/19/2019

## 2019-02-24 DIAGNOSIS — Z961 Presence of intraocular lens: Secondary | ICD-10-CM | POA: Diagnosis not present

## 2019-02-24 DIAGNOSIS — H04123 Dry eye syndrome of bilateral lacrimal glands: Secondary | ICD-10-CM | POA: Diagnosis not present

## 2019-02-24 DIAGNOSIS — Z79899 Other long term (current) drug therapy: Secondary | ICD-10-CM | POA: Diagnosis not present

## 2019-02-28 DIAGNOSIS — M069 Rheumatoid arthritis, unspecified: Secondary | ICD-10-CM | POA: Diagnosis not present

## 2019-02-28 DIAGNOSIS — R05 Cough: Secondary | ICD-10-CM | POA: Diagnosis not present

## 2019-04-20 ENCOUNTER — Ambulatory Visit: Payer: Medicare Other | Admitting: Family Medicine

## 2019-04-21 ENCOUNTER — Other Ambulatory Visit: Payer: Self-pay

## 2019-04-21 ENCOUNTER — Encounter: Payer: Self-pay | Admitting: Family Medicine

## 2019-04-21 ENCOUNTER — Ambulatory Visit (INDEPENDENT_AMBULATORY_CARE_PROVIDER_SITE_OTHER): Payer: Medicare Other | Admitting: Family Medicine

## 2019-04-21 VITALS — BP 136/58 | HR 75

## 2019-04-21 DIAGNOSIS — I499 Cardiac arrhythmia, unspecified: Secondary | ICD-10-CM

## 2019-04-21 DIAGNOSIS — M8580 Other specified disorders of bone density and structure, unspecified site: Secondary | ICD-10-CM | POA: Diagnosis not present

## 2019-04-21 DIAGNOSIS — F419 Anxiety disorder, unspecified: Secondary | ICD-10-CM | POA: Diagnosis not present

## 2019-04-21 DIAGNOSIS — R5382 Chronic fatigue, unspecified: Secondary | ICD-10-CM | POA: Diagnosis not present

## 2019-04-21 DIAGNOSIS — M2550 Pain in unspecified joint: Secondary | ICD-10-CM | POA: Diagnosis not present

## 2019-04-21 DIAGNOSIS — E785 Hyperlipidemia, unspecified: Secondary | ICD-10-CM | POA: Diagnosis not present

## 2019-04-21 DIAGNOSIS — E559 Vitamin D deficiency, unspecified: Secondary | ICD-10-CM | POA: Diagnosis not present

## 2019-04-21 DIAGNOSIS — F32A Depression, unspecified: Secondary | ICD-10-CM

## 2019-04-21 DIAGNOSIS — F329 Major depressive disorder, single episode, unspecified: Secondary | ICD-10-CM

## 2019-04-21 MED ORDER — CITALOPRAM HYDROBROMIDE 20 MG PO TABS: ORAL_TABLET | ORAL | 0 refills | Status: DC

## 2019-04-21 MED ORDER — DULOXETINE HCL 20 MG PO CPEP: ORAL_CAPSULE | ORAL | 6 refills | Status: DC

## 2019-04-21 NOTE — Progress Notes (Signed)
Office Visit Note   Patient: Lacey Salas           Date of Birth: 04-07-1945           MRN: [id number] Visit Date: 04/21/2019 Requested by: Lavada Mesi, MD [address] [place],  [place] [address] PCP: Lavada Mesi, MD  Subjective: Chief Complaint  Patient presents with  . 6 months follow up    HPI: She is here for routine monitoring.  She has not been doing well lately.  Her polyarthralgia symptoms have worsened.  Her rheumatologist is tapering her off prednisone and she is now on methotrexate in addition to hydroxychloroquine.  Her constant pain is exacerbating her depression and anxiety.  She has been on Celexa chronically for migraine headaches.  She has not really been taking it for depression.  She does not have suicidal thoughts, but she feels badly most of the time.  She is due for labs to monitor her medical problems.               ROS: Denies any chest pain or shortness of breath.  Her rheumatologist noted irregular heart rate at last visit.  All other systems were reviewed and are negative.  Objective: Vital Signs: BP (!) 136/58   Pulse 75   Physical Exam:  General:  Alert and oriented, in no acute distress. Pulm:  Breathing unlabored. Psy:  Normal mood, congruent affect. Skin: No rash. Neck: No thyromegaly or nodules.  2+ carotid pulses, no bruits. CV: Irregularly irregular without murmurs, rubs, or gallops.  No peripheral edema.  2+ radial and posterior tibial pulses. Lungs: Clear to auscultation throughout with no wheezing or areas of consolidation.    Imaging: None today  Assessment & Plan: 1.  Depression and anxiety -We will taper off Celexa while starting Cymbalta.  Hopefully this will help with depression as well as with her chronic pain.  She will contact me if she has any side effects or if it does not work.  2.  Polyarthralgia -Managed by rheumatology.  3.  Irregular heart rate -She is asymptomatic, but this is new for her.  We will order  EKG since we do not have a machine here.  4.  Chronic fatigue, vitamin D deficiency, osteopenia -Labs to evaluate.    Procedures: No procedures performed  No notes on file     PMFS History: Patient Active Problem List   Diagnosis Date Noted  . Vitamin D deficiency 10/18/2018  . Chronic cough 11/16/2017  . Chronic fatigue 11/16/2017  . Irregular heart rate 11/16/2017  . Polyarthralgia 02/24/2017  . Fibromyalgia 02/24/2017  . RSD upper limb 09/02/2015  . Hemiplegic migraine without status migrainosus, not intractable 12/05/2013  . HYPERSOMNIA 04/04/2010  . Hyperlipidemia 10/12/2006  . Anxiety and depression 10/11/2006  . Adjustment disorder with mixed anxiety and depressed mood 10/11/2006  . GERD 10/11/2006  . Disorder of bone and cartilage 10/11/2006  . STEVENS-JOHNSON SYNDROME, HX OF 10/11/2006   Past Medical History:  Diagnosis Date  . Anxiety   . Depression   . GERD (gastroesophageal reflux disease)   . History of chicken pox   . Hyperlipidemia   . Migraine    complicated migraines with transient R side facial paralysis and aphasia  . Migraine headache with aura    hemiplegic migraine  . MVP (mitral valve prolapse)   . Osteopenia   . Reflex sympathetic dystrophy, unspecified 2011   R hand related to wrist fx, improved s/p nerve blocks  .  Seizures ([place])   . Stevens-Johnson disease ([place])     Family History  Problem Relation Age of Onset  . Leukemia Brother        CLL  . Arthritis Mother   . Arthritis Father   . Breast cancer Sister   . Arthritis Other   . Ovarian cancer Other   . Hyperlipidemia Other   . Hypertension Other   . Stroke Other     Past Surgical History:  Procedure Laterality Date  . ABDOMINAL HYSTERECTOMY     BSO  . CATARACT EXTRACTION    . HEEL SPUR SURGERY  12/1996   right  . ORIF DISTAL RADIUS FRACTURE  2004   Social History   Occupational History    Employer: RETIRED  Tobacco Use  . Smoking status: Current Every Day Smoker      Packs/day: 1.00    Types: Cigarettes  . Smokeless tobacco: Never Used  . Tobacco comment: Quit over 30 years ago( just needs it a bit now ) Stress   Substance and Sexual Activity  . Alcohol use: No  . Drug use: No  . Sexual activity: Never

## 2019-04-22 ENCOUNTER — Telehealth: Payer: Self-pay | Admitting: Family Medicine

## 2019-04-22 LAB — CBC WITH DIFFERENTIAL/PLATELET
Absolute Monocytes: 1045 cells/uL — ABNORMAL HIGH (ref 200–950)
Basophils Absolute: 77 cells/uL (ref 0–200)
Basophils Relative: 0.6 %
Eosinophils Absolute: 129 cells/uL (ref 15–500)
Eosinophils Relative: 1 %
HCT: 42.2 % (ref 35.0–45.0)
Hemoglobin: 14.6 g/dL (ref 11.7–15.5)
Lymphs Abs: 2012 cells/uL (ref 850–3900)
MCH: 32.1 pg (ref 27.0–33.0)
MCHC: 34.6 g/dL (ref 32.0–36.0)
MCV: 92.7 fL (ref 80.0–100.0)
MPV: 9.2 fL (ref 7.5–12.5)
Monocytes Relative: 8.1 %
Neutro Abs: 9636 cells/uL — ABNORMAL HIGH (ref 1500–7800)
Neutrophils Relative %: 74.7 %
Platelets: 334 10*3/uL (ref 140–400)
RBC: 4.55 10*6/uL (ref 3.80–5.10)
RDW: 13.2 % (ref 11.0–15.0)
Total Lymphocyte: 15.6 %
WBC: 12.9 10*3/uL — ABNORMAL HIGH (ref 3.8–10.8)

## 2019-04-22 LAB — COMPREHENSIVE METABOLIC PANEL
AG Ratio: 1.8 (calc) (ref 1.0–2.5)
ALT: 12 U/L (ref 6–29)
AST: 16 U/L (ref 10–35)
Albumin: 4.4 g/dL (ref 3.6–5.1)
Alkaline phosphatase (APISO): 78 U/L (ref 37–153)
BUN: 13 mg/dL (ref 7–25)
CO2: 26 mmol/L (ref 20–32)
Calcium: 10 mg/dL (ref 8.6–10.4)
Chloride: 105 mmol/L (ref 98–110)
Creat: 0.63 mg/dL (ref 0.60–0.93)
Globulin: 2.5 g/dL (calc) (ref 1.9–3.7)
Glucose, Bld: 98 mg/dL (ref 65–99)
Potassium: 4.3 mmol/L (ref 3.5–5.3)
Sodium: 140 mmol/L (ref 135–146)
Total Bilirubin: 0.4 mg/dL (ref 0.2–1.2)
Total Protein: 6.9 g/dL (ref 6.1–8.1)

## 2019-04-22 LAB — THYROID PANEL WITH TSH
Free Thyroxine Index: 2.1 (ref 1.4–3.8)
T3 Uptake: 26 % (ref 22–35)
T4, Total: 7.9 ug/dL (ref 5.1–11.9)
TSH: 2.19 mIU/L (ref 0.40–4.50)

## 2019-04-22 LAB — IRON,TIBC AND FERRITIN PANEL
%SAT: 26 % (calc) (ref 16–45)
Ferritin: 102 ng/mL (ref 16–288)
Iron: 84 ug/dL (ref 45–160)
TIBC: 318 mcg/dL (calc) (ref 250–450)

## 2019-04-22 LAB — VITAMIN D 25 HYDROXY (VIT D DEFICIENCY, FRACTURES): Vit D, 25-Hydroxy: 25 ng/mL — ABNORMAL LOW (ref 30–100)

## 2019-04-22 NOTE — Telephone Encounter (Signed)
Vitamin D level is low at 25 (we want it between 50-80).  I believe she said she's on 1,000 IU daily.  I recommend taking 5,000 IU daily instead.  In addition, for bone health I recommend vitamin K2 at 100 mcg daily, as well as magnesium at 400 mg daily.  White blood cell count was slightly elevated.  Not sure why, but could be related to prednisone.  I recommend rechecking in about a month to be sure it's normal.  All else looks good.

## 2019-04-24 DIAGNOSIS — M353 Polymyalgia rheumatica: Secondary | ICD-10-CM | POA: Diagnosis not present

## 2019-04-24 DIAGNOSIS — M25551 Pain in right hip: Secondary | ICD-10-CM | POA: Diagnosis not present

## 2019-04-24 DIAGNOSIS — L4050 Arthropathic psoriasis, unspecified: Secondary | ICD-10-CM | POA: Diagnosis not present

## 2019-04-24 DIAGNOSIS — Z79899 Other long term (current) drug therapy: Secondary | ICD-10-CM | POA: Diagnosis not present

## 2019-04-24 DIAGNOSIS — M1990 Unspecified osteoarthritis, unspecified site: Secondary | ICD-10-CM | POA: Diagnosis not present

## 2019-04-24 DIAGNOSIS — M160 Bilateral primary osteoarthritis of hip: Secondary | ICD-10-CM | POA: Diagnosis not present

## 2019-04-24 DIAGNOSIS — M25511 Pain in right shoulder: Secondary | ICD-10-CM | POA: Diagnosis not present

## 2019-04-24 DIAGNOSIS — M069 Rheumatoid arthritis, unspecified: Secondary | ICD-10-CM | POA: Diagnosis not present

## 2019-04-24 DIAGNOSIS — M19012 Primary osteoarthritis, left shoulder: Secondary | ICD-10-CM | POA: Diagnosis not present

## 2019-04-25 NOTE — Telephone Encounter (Signed)
Left message on patient's voicemail to call back for results.

## 2019-04-25 NOTE — Telephone Encounter (Signed)
Advised the patient of her lab results and instructions on taking her vitamins. She will call me in a month to set up a nurse visit for repeat CBC/diff.

## 2019-04-27 ENCOUNTER — Other Ambulatory Visit: Payer: Self-pay | Admitting: Family Medicine

## 2019-05-15 ENCOUNTER — Telehealth: Payer: Self-pay | Admitting: Family Medicine

## 2019-05-15 NOTE — Telephone Encounter (Signed)
Per last note Lacey Salas said patient will need repeat CBC/diff in 1 month scheduled as nurse only visit.

## 2019-05-15 NOTE — Telephone Encounter (Signed)
Patient called. She would like to come in to do blood work. Her call back number is [phone]

## 2019-05-16 ENCOUNTER — Other Ambulatory Visit: Payer: Self-pay | Admitting: Family Medicine

## 2019-05-16 NOTE — Telephone Encounter (Signed)
Tried calling patient to schedule. No answer. LMVM

## 2019-05-18 NOTE — Telephone Encounter (Signed)
Left message on patient's home voice mail to call me back and we'll schedule a time for blood work.

## 2019-05-26 NOTE — Telephone Encounter (Signed)
I called and reached the patient's voice mail again - left another message to call us back to schedule a nurse visit for blood draw only.

## 2019-05-29 ENCOUNTER — Telehealth: Payer: Self-pay | Admitting: Family Medicine

## 2019-05-29 NOTE — Telephone Encounter (Signed)
Patient called and stated that Terri told her to call and make lab appt with her.  Please cal patient to make lab appt.  [phone]

## 2019-05-29 NOTE — Telephone Encounter (Signed)
Holding for Camelia Eng  She will be in office Tuesday.

## 2019-05-31 NOTE — Telephone Encounter (Signed)
Reached the patient - will come in 3/19 in the morning for repeat CBC w/diff (1 month recheck).

## 2019-05-31 NOTE — Telephone Encounter (Signed)
Tried calling patient - reached her voice mail again. No message left - will try again later.

## 2019-06-02 ENCOUNTER — Ambulatory Visit (INDEPENDENT_AMBULATORY_CARE_PROVIDER_SITE_OTHER): Payer: Medicare Other | Admitting: Radiology

## 2019-06-02 ENCOUNTER — Other Ambulatory Visit: Payer: Self-pay

## 2019-06-02 DIAGNOSIS — R748 Abnormal levels of other serum enzymes: Secondary | ICD-10-CM | POA: Diagnosis not present

## 2019-06-02 LAB — CBC
HCT: 42.1 % (ref 35.0–45.0)
Hemoglobin: 14.1 g/dL (ref 11.7–15.5)
MCH: 31.8 pg (ref 27.0–33.0)
MCHC: 33.5 g/dL (ref 32.0–36.0)
MCV: 95 fL (ref 80.0–100.0)
MPV: 9.1 fL (ref 7.5–12.5)
Platelets: 335 10*3/uL (ref 140–400)
RBC: 4.43 10*6/uL (ref 3.80–5.10)
RDW: 13.1 % (ref 11.0–15.0)
WBC: 8.3 10*3/uL (ref 3.8–10.8)

## 2019-06-02 NOTE — Progress Notes (Signed)
Here for labs only---CBC

## 2019-06-05 ENCOUNTER — Telehealth: Payer: Self-pay | Admitting: Family Medicine

## 2019-06-05 NOTE — Telephone Encounter (Signed)
I called and advised the patient of her results. 

## 2019-06-05 NOTE — Telephone Encounter (Signed)
CBC is now back in normal range.

## 2019-06-28 ENCOUNTER — Other Ambulatory Visit: Payer: Self-pay

## 2019-06-28 ENCOUNTER — Encounter: Payer: Self-pay | Admitting: Family Medicine

## 2019-06-28 ENCOUNTER — Ambulatory Visit (INDEPENDENT_AMBULATORY_CARE_PROVIDER_SITE_OTHER): Payer: Medicare Other | Admitting: Family Medicine

## 2019-06-28 VITALS — BP 121/70 | HR 94

## 2019-06-28 DIAGNOSIS — F419 Anxiety disorder, unspecified: Secondary | ICD-10-CM | POA: Diagnosis not present

## 2019-06-28 DIAGNOSIS — F32A Depression, unspecified: Secondary | ICD-10-CM

## 2019-06-28 DIAGNOSIS — F329 Major depressive disorder, single episode, unspecified: Secondary | ICD-10-CM

## 2019-06-28 DIAGNOSIS — G479 Sleep disorder, unspecified: Secondary | ICD-10-CM | POA: Diagnosis not present

## 2019-06-28 DIAGNOSIS — M25641 Stiffness of right hand, not elsewhere classified: Secondary | ICD-10-CM | POA: Diagnosis not present

## 2019-06-28 DIAGNOSIS — R5383 Other fatigue: Secondary | ICD-10-CM

## 2019-06-28 MED ORDER — AMITRIPTYLINE HCL 10 MG PO TABS: ORAL_TABLET | ORAL | 3 refills | Status: DC

## 2019-06-28 NOTE — Progress Notes (Signed)
Office Visit Note   Patient: Lacey Salas           Date of Birth: 06-06-1945           MRN: [id number] Visit Date: 06/28/2019 Requested by: Eunice Blase, MD [address] [place],  [place] [address] PCP: Eunice Blase, MD  Subjective: Chief Complaint  Patient presents with  . extreme fatigue/weakness/lightheadedness/weird dreams  . stiffness right hand, h/o RSD x 16 years ago    HPI: She is here with multiple concerns.  Last visit we started Cymbalta for depression and multiple areas of pain.  She thinks the depression symptoms have improved, but she may be having side effects to the medication.  She has had very vivid weird dreams, extreme fatigue, weakness and lightheadedness.  In the past for depression and RSD she was treated successfully with Elavil.  She wonders whether she can try that again.  In addition, her right arm has become painful and stiff in the past few months.  This is the arm that had RSD 16 years ago treated with sympathetic blocks and physical therapy.  She does not have the same hypersensitivity as before, but she is concerned that this might be the beginnings of RSD.              ROS:   All other systems were reviewed and are negative.  Objective: Vital Signs: BP 121/70   Pulse 94   Physical Exam:  General:  Alert and oriented, in no acute distress. Pulm:  Breathing unlabored. Psy:  Normal mood, congruent affect. Skin: No color change in the right arm compared to the left. Right arm: She has some stiffness in her wrist and shoulder compared to the left.  There is some diffuse soft tissue swelling of the fingers.  Imaging: None today  Assessment & Plan: 1.  Depression with fatigue and weakness, probable side effects of Cymbalta. -She will taper off Cymbalta and start Elavil.  If she has any side effects with Elavil she will stop it immediately and we will try something different.  2.  Right arm stiffness, possible RSD flareup -Referral to PT and  hand.     Procedures: No procedures performed  No notes on file     PMFS History: Patient Active Problem List   Diagnosis Date Noted  . Vitamin D deficiency 10/18/2018  . Chronic cough 11/16/2017  . Chronic fatigue 11/16/2017  . Irregular heart rate 11/16/2017  . Polyarthralgia 02/24/2017  . Fibromyalgia 02/24/2017  . RSD upper limb 09/02/2015  . Hemiplegic migraine without status migrainosus, not intractable 12/05/2013  . HYPERSOMNIA 04/04/2010  . Hyperlipidemia 10/12/2006  . Anxiety and depression 10/11/2006  . Adjustment disorder with mixed anxiety and depressed mood 10/11/2006  . GERD 10/11/2006  . Disorder of bone and cartilage 10/11/2006  . STEVENS-JOHNSON SYNDROME, HX OF 10/11/2006   Past Medical History:  Diagnosis Date  . Anxiety   . Depression   . GERD (gastroesophageal reflux disease)   . History of chicken pox   . Hyperlipidemia   . Migraine    complicated migraines with transient R side facial paralysis and aphasia  . Migraine headache with aura    hemiplegic migraine  . MVP (mitral valve prolapse)   . Osteopenia   . Reflex sympathetic dystrophy, unspecified 2011   R hand related to wrist fx, improved s/p nerve blocks  . Seizures ([place])   . Stevens-Johnson disease ([place])     Family History  Problem Relation Age of  Onset  . Leukemia Brother        CLL  . Arthritis Mother   . Arthritis Father   . Breast cancer Sister   . Arthritis Other   . Ovarian cancer Other   . Hyperlipidemia Other   . Hypertension Other   . Stroke Other     Past Surgical History:  Procedure Laterality Date  . ABDOMINAL HYSTERECTOMY     BSO  . CATARACT EXTRACTION    . HEEL SPUR SURGERY  12/1996   right  . ORIF DISTAL RADIUS FRACTURE  2004   Social History   Occupational History    Employer: RETIRED  Tobacco Use  . Smoking status: Current Every Day Smoker    Packs/day: 1.00    Types: Cigarettes  . Smokeless tobacco: Never Used  . Tobacco comment: Quit over 30  years ago( just needs it a bit now ) Stress   Substance and Sexual Activity  . Alcohol use: No  . Drug use: No  . Sexual activity: Never

## 2019-06-28 NOTE — Patient Instructions (Signed)
    Cymbalta:  - Take 1 pill every other day for 1-2 weeks, then 1 pill every 3 days for 1-2 weeks, then stop.

## 2019-07-14 ENCOUNTER — Telehealth: Payer: Self-pay | Admitting: Family Medicine

## 2019-07-14 NOTE — Telephone Encounter (Signed)
Katie from [facility] called. They need a referral faxed to [phone]

## 2019-07-17 ENCOUNTER — Other Ambulatory Visit: Payer: Self-pay

## 2019-07-17 DIAGNOSIS — M25531 Pain in right wrist: Secondary | ICD-10-CM | POA: Diagnosis not present

## 2019-07-17 DIAGNOSIS — M25611 Stiffness of right shoulder, not elsewhere classified: Secondary | ICD-10-CM | POA: Diagnosis not present

## 2019-07-17 DIAGNOSIS — M25641 Stiffness of right hand, not elsewhere classified: Secondary | ICD-10-CM

## 2019-07-17 DIAGNOSIS — M6281 Muscle weakness (generalized): Secondary | ICD-10-CM | POA: Diagnosis not present

## 2019-07-17 DIAGNOSIS — M25631 Stiffness of right wrist, not elsewhere classified: Secondary | ICD-10-CM | POA: Diagnosis not present

## 2019-07-17 DIAGNOSIS — M25511 Pain in right shoulder: Secondary | ICD-10-CM | POA: Diagnosis not present

## 2019-07-17 NOTE — Telephone Encounter (Signed)
Order faxed received confirmation.

## 2019-07-18 NOTE — Progress Notes (Signed)
NEUROLOGY FOLLOW UP OFFICE NOTE  Kayloni Rocco [id number]  HISTORY OF PRESENT ILLNESS: Lacey Salas is a 74year old right-handed woman with RA, anxiety, depression, migraine, complex regional pain syndrome of right hand, hyperlipidemia, osteopenia, MVP and Stevens-Johnson syndrome who follows up for complicated migraines (hemiplegic migraine and ocular migraine).  UPDATE: Due to new visual disturbance, MRI of brain without contrast was performed on 10/20/2018, which was personally reviewed and demonstrated moderate chronic small vessel ischemic changes in the cerebral white matter and 73ons.  Due to endorsing myoclonus, MRI of cervical spine was also performed and showed noncompressive degenerative changes but normal cervical spinal cord. She had a formal eye exam which was unremarkable.  No longer having these symptoms.  Unclear if it may have been related to prednisone.  No complicated migraines but gets migraines with visual aura 1 to 2 days a week.  Physically, she is not feeling well.  Due to recurrence of CRPS (swelling, hand cramps), she was started by her treating physician with amitriptyline.  However, she notes irritability and weight gain.    Current NSAIDS:ASA 81mg  daily Current analgesics: Tylenol Current triptans: no Current anti-emetic: no Current muscle relaxants: no Current anti-anxiolytic: none Current sleep aide: no Current Antihypertensive medications: atenolol 25mg  Current Antidepressant medications: amitriptyline 10mg  at bedtime (for recurrence of CRPS, feels agitated); Current Anticonvulsant medications: no Current Vitamins/Herbal/Supplements: no Current Antihistamines/Decongestants: no Other therapy: no Prednisone and Plaquenil for RA  Caffeine: no Alcohol: no Smoker: Quit 34 years ago but started again due to recent tragedy Depression: Significant depression and anxiety.  HISTORY: She has been evaluated by several neurologists and  headache specialists, such as Dr. Morene Antu, Dr. Coralie Common, Dr. Einar Crow, Dr. Drenda Freeze, and Dr. Marney Setting. Occurs spontaneously, including while driving. Onset:Has history of typical migraines (with headache, nausea and vomiting) since age 74.This subsided and started to have these current spells starting in her early 73s.70% of the time it is accompanied by headache. Location:holocephalic Quality:Non-throbbing pressure Initial intensity:5/10 Aura:no Prodrome:Indescribable "foreboding" feeling Associated symptoms:Migraines typically present as TIA-like symptoms, including right-sided weakness involving face, arm and leg, photophobia, word-finding difficulties, difficulty getting words out, slurred speech, staggering gait.She is conscious and alert but doesn't feel like she can function during the spell.Following episodes, she is fatigued and will sleep for 2-3 hours.On rare occasions, accompanied by visual field cut. Initial duration:30 minutes Initial frequency:Varies.Usually once a month but may be 3 times a week. Triggers:  Chemical smells, some perfumes, bright lights Relieving factors:  Laying down Activity:Cannot function  MRI/MRA of head from 06/15/08 were unremarkable. EEGs from 11/02/12 and 09/27/13 were normal.  Past abortive therapy:none Past preventative therapy:Amitriptyline (initially for complex regional pain syndrome), venlafaxine (racing thoughts), Sinequan, onriboflavin, Cymbalta, citalopram  Family history of headache:Maternal grandmother, sister  Neuropathy: She reports numbness, tingling and burning in the feet.  No weakness or radicular back pain. Sed rate mildly elevated at 42. ANA, CRP, SPEP/IFE, TSH and B12 (610) were unremarkable. She is seeing rheumatology.  Visual Disturbance: In June 2020, she had episode of sensation that eyes are crossing but for 10-15 seconds but denies actually seeing double  vision or blurred vision, occurring once or twice a day and may go several days before it occurs again.  No associated headache.  Sometimes she thinks she sees something in her peripheral vision that isn't there.  One time, she thought she saw a fire on the burner which isn't there..  She also notes uncontrolled twitching of the legs or  upper part of her body, lasting a few seconds.  She also has been feeling lightheaded.  This started prior to starting prednisone and Plaquenil.  Anxiety is controlled.    PAST MEDICAL HISTORY: Past Medical History:  Diagnosis Date   Anxiety    Depression    GERD (gastroesophageal reflux disease)    History of chicken pox    Hyperlipidemia    Migraine    complicated migraines with transient R side facial paralysis and aphasia   Migraine headache with aura    hemiplegic migraine   MVP (mitral valve prolapse)    Osteopenia    Reflex sympathetic dystrophy, unspecified 2011   R hand related to wrist fx, improved s/p nerve blocks   Seizures (HCC)    Stevens-Johnson disease (HCC)     MEDICATIONS: Current Outpatient Medications on File Prior to Visit  Medication Sig Dispense Refill   Acetaminophen (TYLENOL 8 HOUR PO) Take by mouth daily.     amitriptyline (ELAVIL) 10 MG tablet 1 PO q HS, may increase to 2 PO q HS if needed 60 tablet 3   atenolol (TENORMIN) 25 MG tablet TAKE 1 TABLET BY MOUTH EVERY DAY 90 tablet 2   esomeprazole (NEXIUM) 20 MG capsule Take 1 capsule (20 mg total) by mouth daily before breakfast. OVC     folic acid (FOLVITE) 1 MG tablet Take 1 mg by mouth daily.     hydroxychloroquine (PLAQUENIL) 200 MG tablet TAKE 2 TABLETS BY MOUTH EVERY DAY (M06.9)     methotrexate (RHEUMATREX) 2.5 MG tablet Take 10 mg by mouth once a week.     Multiple Vitamins-Minerals (VITAMIN D3 COMPLETE PO) Take by mouth daily.     predniSONE (DELTASONE) 10 MG tablet Take 10 mg by mouth daily.     No current facility-administered medications on  file prior to visit.    ALLERGIES: Allergies  Allergen Reactions   Cephalexin     REACTION: Levonne Spiller   Epinephrine     REACTION: hypertensive reaction   Erythromycin     REACTION: Rash   Lactated Ringers     REACTION: cardiac symptoms   Nortriptyline     Rapid heart rate   Penicillins     REACTION: Rash   Sulfamethoxazole-Trimethoprim     REACTION: Levonne Spiller   Sulfonamide Derivatives    Trimethoprim     REACTION: stevens johnson syndrome   Wellbutrin [Bupropion] Other (See Comments)    tremors    FAMILY HISTORY: Family History  Problem Relation Age of Onset   Leukemia Brother        CLL   Arthritis Mother    Arthritis Father    Breast cancer Sister    Arthritis Other    Ovarian cancer Other    Hyperlipidemia Other    Hypertension Other    Stroke Other     SOCIAL HISTORY: Social History   Socioeconomic History   Marital status: Legally Separated    Spouse name: Not on file   Number of children: Not on file   Years of education: Not on file   Highest education level: Not on file  Occupational History    Employer: RETIRED  Tobacco Use   Smoking status: Current Every Day Smoker    Packs/day: 1.00    Types: Cigarettes   Smokeless tobacco: Never Used   Tobacco comment: Quit over 30 years ago( just needs it a bit now ) Stress   Substance and Sexual Activity   Alcohol use: No  Drug use: No   Sexual activity: Never  Other Topics Concern   Not on file  Social History Narrative   Not on file   Social Determinants of Health   Financial Resource Strain:    Difficulty of Paying Living Expenses:   Food Insecurity:    Worried About Programme researcher, broadcasting/film/video in the Last Year:    Barista in the Last Year:   Transportation Needs:    Freight forwarder (Medical):    Lack of Transportation (Non-Medical):   Physical Activity:    Days of Exercise per Week:    Minutes of Exercise per Session:   Stress:     Feeling of Stress :   Social Connections:    Frequency of Communication with Friends and Family:    Frequency of Social Gatherings with Friends and Family:    Attends Religious Services:    Active Member of Clubs or Organizations:    Attends Engineer, structural:    Marital Status:   Intimate Partner Violence:    Fear of Current or Ex-Partner:    Emotionally Abused:    Physically Abused:    Sexually Abused:     PHYSICAL EXAM: Blood pressure 135/83, pulse 82, height 5\' 3"  (1.6 m), weight 151 lb 3.2 oz (68.6 kg), SpO2 98 %. General: No acute distress.  Patient appears well-groomed.   Head:  Normocephalic/atraumatic Eyes:  Fundi examined but not visualized Neck: supple, no paraspinal tenderness, full range of motion Heart:  Regular rate and rhythm Lungs:  Clear to auscultation bilaterally Back: No paraspinal tenderness Neurological Exam: alert and oriented to person, place, and time. Attention span and concentration intact, recent and remote memory intact, fund of knowledge intact.  Speech fluent and not dysarthric, language intact.  CN II-XII intact. Bulk and tone normal, muscle strength 5/5 throughout.  Sensation to light touch, temperature and vibration intact.  Deep tendon reflexes 2+ throughout, toes downgoing.  Finger to nose and heel to shin testing intact.  Gait normal, Romberg negative.  IMPRESSION: 1.  Hemiplegic migraine, controlled 2.  Ocular migraine, some mild increased frequency may be related to increased depression and polypharmacy. 3.  CRPS of right arm.  Her primary concern is side effects to amitriptyline.  I have advised that she contact Dr. about changing to an alternative medication.  Sometimes, nortriptyline may be better tolerated.  Follow up with me in 9 months.   Prince Rome, DO  CC: Shon Millet, MD

## 2019-07-19 ENCOUNTER — Ambulatory Visit: Payer: Medicare Other | Admitting: Neurology

## 2019-07-19 ENCOUNTER — Encounter: Payer: Self-pay | Admitting: Neurology

## 2019-07-19 ENCOUNTER — Other Ambulatory Visit: Payer: Self-pay

## 2019-07-19 VITALS — BP 135/83 | HR 82 | Ht 63.0 in | Wt 151.2 lb

## 2019-07-19 DIAGNOSIS — M25631 Stiffness of right wrist, not elsewhere classified: Secondary | ICD-10-CM | POA: Diagnosis not present

## 2019-07-19 DIAGNOSIS — G90511 Complex regional pain syndrome I of right upper limb: Secondary | ICD-10-CM

## 2019-07-19 DIAGNOSIS — G43409 Hemiplegic migraine, not intractable, without status migrainosus: Secondary | ICD-10-CM | POA: Diagnosis not present

## 2019-07-19 DIAGNOSIS — M25531 Pain in right wrist: Secondary | ICD-10-CM | POA: Diagnosis not present

## 2019-07-19 DIAGNOSIS — G43109 Migraine with aura, not intractable, without status migrainosus: Secondary | ICD-10-CM

## 2019-07-19 DIAGNOSIS — M25611 Stiffness of right shoulder, not elsewhere classified: Secondary | ICD-10-CM | POA: Diagnosis not present

## 2019-07-19 DIAGNOSIS — M6281 Muscle weakness (generalized): Secondary | ICD-10-CM | POA: Diagnosis not present

## 2019-07-19 DIAGNOSIS — M25511 Pain in right shoulder: Secondary | ICD-10-CM | POA: Diagnosis not present

## 2019-07-19 MED ORDER — NORTRIPTYLINE HCL 10 MG PO CAPS: ORAL_CAPSULE | ORAL | 6 refills | Status: DC

## 2019-07-19 NOTE — Patient Instructions (Signed)
I will reach out to Dr. Prince Rome about maybe changing amitriptyline to nortriptyline Follow up in 9 months

## 2019-07-19 NOTE — Addendum Note (Signed)
Addended by: Lillia Carmel on: 07/19/2019 11:35 AM   Modules accepted: Orders

## 2019-07-21 ENCOUNTER — Other Ambulatory Visit: Payer: Self-pay | Admitting: Family Medicine

## 2019-07-24 DIAGNOSIS — L4050 Arthropathic psoriasis, unspecified: Secondary | ICD-10-CM | POA: Diagnosis not present

## 2019-07-24 DIAGNOSIS — M1990 Unspecified osteoarthritis, unspecified site: Secondary | ICD-10-CM | POA: Diagnosis not present

## 2019-07-24 DIAGNOSIS — M069 Rheumatoid arthritis, unspecified: Secondary | ICD-10-CM | POA: Diagnosis not present

## 2019-07-24 DIAGNOSIS — M7551 Bursitis of right shoulder: Secondary | ICD-10-CM | POA: Diagnosis not present

## 2019-07-24 DIAGNOSIS — Z79899 Other long term (current) drug therapy: Secondary | ICD-10-CM | POA: Diagnosis not present

## 2019-07-24 DIAGNOSIS — M353 Polymyalgia rheumatica: Secondary | ICD-10-CM | POA: Diagnosis not present

## 2019-08-01 ENCOUNTER — Ambulatory Visit (INDEPENDENT_AMBULATORY_CARE_PROVIDER_SITE_OTHER): Payer: Medicare Other | Admitting: Family Medicine

## 2019-08-01 ENCOUNTER — Encounter: Payer: Self-pay | Admitting: Family Medicine

## 2019-08-01 ENCOUNTER — Other Ambulatory Visit: Payer: Self-pay

## 2019-08-01 DIAGNOSIS — F329 Major depressive disorder, single episode, unspecified: Secondary | ICD-10-CM

## 2019-08-01 DIAGNOSIS — F419 Anxiety disorder, unspecified: Secondary | ICD-10-CM

## 2019-08-01 DIAGNOSIS — M25641 Stiffness of right hand, not elsewhere classified: Secondary | ICD-10-CM

## 2019-08-01 DIAGNOSIS — M25511 Pain in right shoulder: Secondary | ICD-10-CM

## 2019-08-01 DIAGNOSIS — F32A Depression, unspecified: Secondary | ICD-10-CM

## 2019-08-01 NOTE — Progress Notes (Signed)
Office Visit Note   Patient: Lacey Salas           Date of Birth: 07/11/1945           MRN: [id number] Visit Date: 08/01/2019 Requested by: Lavada Mesi, MD [address] [place],  [place] [address] PCP: Lavada Mesi, MD  Subjective: Chief Complaint  Patient presents with  . Pain in right arm and left lower/mid back  . did not react well to amitryptilline    HPI: She is here with persistent right arm pain.  She is working with hand therapy.  She had a subacromial injection elsewhere about a week ago.  It helped for a few days.  She took amitriptyline but did not react well to it, she did not feel right and her eye was bloodshot.  She did not realize that I had called in nortriptyline to take its place.  Her left flank area has been hurting as well.               ROS:   All other systems were reviewed and are negative.  Objective: Vital Signs: There were no vitals taken for this visit.  Physical Exam:  General:  Alert and oriented, in no acute distress. Pulm:  Breathing unlabored. Psy:  Normal mood, congruent affect. Skin: Slight erythema of the fingers of both hands. Right shoulder: 5/5 rotator cuff strength throughout with minimal pain.  She still has good movement of her elbow, wrist and fingers.  Imaging: No results found.  Assessment & Plan: 1.  Persistent right arm pain with possible complex regional pain syndrome flareup -She will try nortriptyline.  Continue with physical therapy/hand therapy.     Procedures: No procedures performed  No notes on file     PMFS History: Patient Active Problem List   Diagnosis Date Noted  . Vitamin D deficiency 10/18/2018  . Chronic cough 11/16/2017  . Chronic fatigue 11/16/2017  . Irregular heart rate 11/16/2017  . Polyarthralgia 02/24/2017  . Fibromyalgia 02/24/2017  . RSD upper limb 09/02/2015  . Hemiplegic migraine without status migrainosus, not intractable 12/05/2013  . HYPERSOMNIA 04/04/2010  . Hyperlipidemia  10/12/2006  . Anxiety and depression 10/11/2006  . Adjustment disorder with mixed anxiety and depressed mood 10/11/2006  . GERD 10/11/2006  . Disorder of bone and cartilage 10/11/2006  . STEVENS-JOHNSON SYNDROME, HX OF 10/11/2006   Past Medical History:  Diagnosis Date  . Anxiety   . Depression   . GERD (gastroesophageal reflux disease)   . History of chicken pox   . Hyperlipidemia   . Migraine    complicated migraines with transient R side facial paralysis and aphasia  . Migraine headache with aura    hemiplegic migraine  . MVP (mitral valve prolapse)   . Osteopenia   . Reflex sympathetic dystrophy, unspecified 2011   R hand related to wrist fx, improved s/p nerve blocks  . Seizures (HCC)   . Stevens-Johnson disease (HCC)     Family History  Problem Relation Age of Onset  . Leukemia Brother        CLL  . Arthritis Mother   . Arthritis Father   . Breast cancer Sister   . Arthritis Other   . Ovarian cancer Other   . Hyperlipidemia Other   . Hypertension Other   . Stroke Other     Past Surgical History:  Procedure Laterality Date  . ABDOMINAL HYSTERECTOMY     BSO  . CATARACT EXTRACTION    . HEEL  SPUR SURGERY  12/1996   right  . ORIF DISTAL RADIUS FRACTURE  2004   Social History   Occupational History    Employer: RETIRED  Tobacco Use  . Smoking status: Current Every Day Smoker    Packs/day: 1.00    Types: Cigarettes  . Smokeless tobacco: Never Used  . Tobacco comment: Quit over 30 years ago( just needs it a bit now ) Stress   Substance and Sexual Activity  . Alcohol use: No  . Drug use: No  . Sexual activity: Never

## 2019-08-10 ENCOUNTER — Telehealth: Payer: Self-pay | Admitting: Family Medicine

## 2019-08-10 NOTE — Telephone Encounter (Signed)
Patient called stating that the new medication is not working and it has too many side affects.  She would like to switch back to her original medication.  CB#[phone].  Thank you.

## 2019-08-11 MED ORDER — CITALOPRAM HYDROBROMIDE 40 MG PO TABS: 40.0000 mg | ORAL_TABLET | Freq: Every day | ORAL | 1 refills | Status: DC

## 2019-08-11 NOTE — Telephone Encounter (Signed)
Left the patient a voice mail with this information.

## 2019-08-11 NOTE — Telephone Encounter (Signed)
Celexa Rx sent.  Can try 1/2 tab at first if she'd like, or else can resume the full 40 mg tabs.

## 2019-08-11 NOTE — Telephone Encounter (Signed)
FYI: The patient cannot take the nortriptyline either: on it, she feels anxious/depressed, has heartburn/constipation/diarrhea. She still has the citalopram 40 mg that Dr. Everlena Cooper started her on many years ago - she wants to start back on this. The patient will check with the pharmacist about how to stop the nortriptyline, whether she needs to wean off it or if she can just stop it.

## 2019-08-16 DIAGNOSIS — M353 Polymyalgia rheumatica: Secondary | ICD-10-CM | POA: Diagnosis not present

## 2019-08-16 DIAGNOSIS — M1990 Unspecified osteoarthritis, unspecified site: Secondary | ICD-10-CM | POA: Diagnosis not present

## 2019-08-16 DIAGNOSIS — M069 Rheumatoid arthritis, unspecified: Secondary | ICD-10-CM | POA: Diagnosis not present

## 2019-08-16 DIAGNOSIS — L4050 Arthropathic psoriasis, unspecified: Secondary | ICD-10-CM | POA: Diagnosis not present

## 2019-08-16 DIAGNOSIS — Z79899 Other long term (current) drug therapy: Secondary | ICD-10-CM | POA: Diagnosis not present

## 2019-08-17 DIAGNOSIS — G5601 Carpal tunnel syndrome, right upper limb: Secondary | ICD-10-CM | POA: Diagnosis not present

## 2019-11-16 DIAGNOSIS — M353 Polymyalgia rheumatica: Secondary | ICD-10-CM | POA: Diagnosis not present

## 2019-11-16 DIAGNOSIS — L4050 Arthropathic psoriasis, unspecified: Secondary | ICD-10-CM | POA: Diagnosis not present

## 2019-11-16 DIAGNOSIS — M069 Rheumatoid arthritis, unspecified: Secondary | ICD-10-CM | POA: Diagnosis not present

## 2019-11-16 DIAGNOSIS — Z79899 Other long term (current) drug therapy: Secondary | ICD-10-CM | POA: Diagnosis not present

## 2019-11-16 DIAGNOSIS — M1990 Unspecified osteoarthritis, unspecified site: Secondary | ICD-10-CM | POA: Diagnosis not present

## 2020-01-27 ENCOUNTER — Other Ambulatory Visit: Payer: Self-pay | Admitting: Family Medicine

## 2020-02-09 ENCOUNTER — Other Ambulatory Visit: Payer: Self-pay | Admitting: Family Medicine

## 2020-02-15 DIAGNOSIS — Z79899 Other long term (current) drug therapy: Secondary | ICD-10-CM | POA: Diagnosis not present

## 2020-02-15 DIAGNOSIS — M7062 Trochanteric bursitis, left hip: Secondary | ICD-10-CM | POA: Diagnosis not present

## 2020-02-15 DIAGNOSIS — M1990 Unspecified osteoarthritis, unspecified site: Secondary | ICD-10-CM | POA: Diagnosis not present

## 2020-02-15 DIAGNOSIS — M069 Rheumatoid arthritis, unspecified: Secondary | ICD-10-CM | POA: Diagnosis not present

## 2020-02-15 DIAGNOSIS — M353 Polymyalgia rheumatica: Secondary | ICD-10-CM | POA: Diagnosis not present

## 2020-02-15 DIAGNOSIS — L4050 Arthropathic psoriasis, unspecified: Secondary | ICD-10-CM | POA: Diagnosis not present

## 2020-04-22 ENCOUNTER — Ambulatory Visit: Payer: Medicare Other | Admitting: Neurology

## 2020-05-15 DIAGNOSIS — M797 Fibromyalgia: Secondary | ICD-10-CM | POA: Diagnosis not present

## 2020-05-15 DIAGNOSIS — M7062 Trochanteric bursitis, left hip: Secondary | ICD-10-CM | POA: Diagnosis not present

## 2020-05-15 DIAGNOSIS — Z79899 Other long term (current) drug therapy: Secondary | ICD-10-CM | POA: Diagnosis not present

## 2020-05-15 DIAGNOSIS — M069 Rheumatoid arthritis, unspecified: Secondary | ICD-10-CM | POA: Diagnosis not present

## 2020-05-15 DIAGNOSIS — M353 Polymyalgia rheumatica: Secondary | ICD-10-CM | POA: Diagnosis not present

## 2020-05-15 DIAGNOSIS — M1990 Unspecified osteoarthritis, unspecified site: Secondary | ICD-10-CM | POA: Diagnosis not present

## 2020-05-15 DIAGNOSIS — L4050 Arthropathic psoriasis, unspecified: Secondary | ICD-10-CM | POA: Diagnosis not present

## 2020-05-15 DIAGNOSIS — M7550 Bursitis of unspecified shoulder: Secondary | ICD-10-CM | POA: Diagnosis not present

## 2020-05-15 DIAGNOSIS — M4800 Spinal stenosis, site unspecified: Secondary | ICD-10-CM | POA: Diagnosis not present

## 2020-05-15 DIAGNOSIS — G9050 Complex regional pain syndrome I, unspecified: Secondary | ICD-10-CM | POA: Diagnosis not present

## 2020-05-15 DIAGNOSIS — M8580 Other specified disorders of bone density and structure, unspecified site: Secondary | ICD-10-CM | POA: Diagnosis not present

## 2020-05-30 ENCOUNTER — Encounter: Payer: Self-pay | Admitting: Neurology

## 2020-06-19 ENCOUNTER — Ambulatory Visit (INDEPENDENT_AMBULATORY_CARE_PROVIDER_SITE_OTHER): Payer: Medicare Other | Admitting: Family Medicine

## 2020-06-19 ENCOUNTER — Other Ambulatory Visit: Payer: Self-pay

## 2020-06-19 ENCOUNTER — Encounter: Payer: Self-pay | Admitting: Family Medicine

## 2020-06-19 VITALS — BP 111/56 | HR 85 | Ht 63.25 in | Wt 150.6 lb

## 2020-06-19 DIAGNOSIS — F32A Depression, unspecified: Secondary | ICD-10-CM

## 2020-06-19 DIAGNOSIS — E785 Hyperlipidemia, unspecified: Secondary | ICD-10-CM | POA: Diagnosis not present

## 2020-06-19 DIAGNOSIS — G90511 Complex regional pain syndrome I of right upper limb: Secondary | ICD-10-CM | POA: Diagnosis not present

## 2020-06-19 DIAGNOSIS — M2550 Pain in unspecified joint: Secondary | ICD-10-CM | POA: Diagnosis not present

## 2020-06-19 DIAGNOSIS — Z0000 Encounter for general adult medical examination without abnormal findings: Secondary | ICD-10-CM

## 2020-06-19 DIAGNOSIS — F419 Anxiety disorder, unspecified: Secondary | ICD-10-CM | POA: Diagnosis not present

## 2020-06-19 DIAGNOSIS — E559 Vitamin D deficiency, unspecified: Secondary | ICD-10-CM | POA: Diagnosis not present

## 2020-06-19 DIAGNOSIS — K219 Gastro-esophageal reflux disease without esophagitis: Secondary | ICD-10-CM | POA: Diagnosis not present

## 2020-06-19 DIAGNOSIS — M8580 Other specified disorders of bone density and structure, unspecified site: Secondary | ICD-10-CM | POA: Diagnosis not present

## 2020-06-19 DIAGNOSIS — M25552 Pain in left hip: Secondary | ICD-10-CM

## 2020-06-19 DIAGNOSIS — R739 Hyperglycemia, unspecified: Secondary | ICD-10-CM | POA: Diagnosis not present

## 2020-06-19 NOTE — Patient Instructions (Signed)
     L-Theanine:  100-200 mg one hour before bed time  Phosphatidylserine:  300-600 mg one hour before bed time  Melatonin:  2-6 mg one hour before bed time  Magnesium:  400 mg one hour before bed time      

## 2020-06-19 NOTE — Progress Notes (Signed)
Office Visit Note   Patient: Lacey Salas           Date of Birth: 1945-09-08           MRN: [id number] Visit Date: 06/19/2020 Requested by: Lavada Mesi, MD [address] [place],  [place] [address] PCP: Lavada Mesi, MD  Subjective: Chief Complaint  Patient presents with  . Medicare Wellness    HPI: She is here for a wellness exam.  Overall she is doing well, she got back with her husband after 8 years of separation.  She is very happy about this.  She is back on Celexa for depression and it seems to be working well for her.  She did not tolerate tricyclic antidepressants.  She is having some trouble sleeping intermittently.  She is also having some abdominal discomfort occasionally and some left lateral hip pain.  She has been having unwanted facial hair growth.  She also cannot seem to lose weight.  She has not had a mammogram since 2013.  He had a colonoscopy in 2007 which was normal.  She is up-to-date on eye exams and dental exams.               ROS:   All other systems were reviewed and are negative.  Objective: Vital Signs: BP (!) 111/56   Pulse 85   Ht 5' 3.25" (1.607 m)   Wt 150 lb 9.6 oz (68.3 kg)   BMI 26.47 kg/m   Physical Exam:  General:  Alert and oriented, in no acute distress. Pulm:  Breathing unlabored. Psy:  Normal mood, congruent affect. Skin: No suspicious lesions HEENT:  [place]/AT, PERRLA, EOM Full, no nystagmus.  Funduscopic examination within normal limits.  No conjunctival erythema.  Tympanic membranes are pearly gray with normal landmarks.  External ear canals are normal.  Nasal passages are clear.  Oropharynx is clear.  No significant lymphadenopathy.  No thyromegaly or nodules.  2+ carotid pulses without bruits. CV: Regular rate and rhythm without murmurs, rubs, or gallops.  No peripheral edema.  2+ radial and posterior tibial pulses. Lungs: Clear to auscultation throughout with no wheezing or areas of consolidation. Abd: Bowel sounds are  active, no hepatosplenomegaly or masses.  Soft and nontender.  No audible bruits.  No evidence of ascites. Left hip: Tender over the greater trochanter.    Imaging: No results found.  Assessment & Plan: 1.  Wellness examination -Labs today.  Mammogram ordered.  2.  Sleep disturbance -I gave her some over-the-counter suggestions.  3.  Abdominal pain -Try diet changes.  4.  Unintended weight gain and facial hair growth -She will try minimizing intake of processed carbohydrates and sweets.  If troubles persist consider hormone testing.  5.  Left greater trochanter syndrome -Home exercises given.     Procedures: No procedures performed        PMFS History: Patient Active Problem List   Diagnosis Date Noted  . Osteopenia 06/19/2020  . Vitamin D deficiency 10/18/2018  . Chronic cough 11/16/2017  . Chronic fatigue 11/16/2017  . Irregular heart rate 11/16/2017  . Polyarthralgia 02/24/2017  . Fibromyalgia 02/24/2017  . RSD upper limb 09/02/2015  . Hemiplegic migraine without status migrainosus, not intractable 12/05/2013  . HYPERSOMNIA 04/04/2010  . Hyperlipidemia 10/12/2006  . Anxiety and depression 10/11/2006  . Adjustment disorder with mixed anxiety and depressed mood 10/11/2006  . GERD 10/11/2006  . Disorder of bone and cartilage 10/11/2006  . STEVENS-JOHNSON SYNDROME, HX OF 10/11/2006   Past Medical History:  Diagnosis Date  . Anxiety   . Depression   . GERD (gastroesophageal reflux disease)   . History of chicken pox   . Hyperlipidemia   . Migraine    complicated migraines with transient R side facial paralysis and aphasia  . Migraine headache with aura    hemiplegic migraine  . MVP (mitral valve prolapse)   . Osteopenia   . Reflex sympathetic dystrophy, unspecified 2011   R hand related to wrist fx, improved s/p nerve blocks  . Seizures (HCC)   . Stevens-Johnson disease (HCC)     Family History  Problem Relation Age of Onset  . Leukemia Brother         CLL  . Cancer Brother   . Arthritis Mother   . Arthritis Father   . Breast cancer Sister   . Cancer Sister   . Arthritis Other   . Ovarian cancer Other   . Hyperlipidemia Other   . Hypertension Other   . Stroke Other   . Colon cancer Neg Hx     Past Surgical History:  Procedure Laterality Date  . ABDOMINAL HYSTERECTOMY     BSO  . CATARACT EXTRACTION    . HEEL SPUR SURGERY  12/1996   right  . ORIF DISTAL RADIUS FRACTURE  2004   Social History   Occupational History    Employer: RETIRED  Tobacco Use  . Smoking status: Current Every Day Smoker    Packs/day: 1.00    Types: Cigarettes  . Smokeless tobacco: Never Used  . Tobacco comment: Quit over 30 years ago( just needs it a bit now ) Stress   Substance and Sexual Activity  . Alcohol use: No  . Drug use: No  . Sexual activity: Never

## 2020-06-20 ENCOUNTER — Encounter: Payer: Self-pay | Admitting: Family Medicine

## 2020-06-20 ENCOUNTER — Telehealth: Payer: Self-pay | Admitting: Family Medicine

## 2020-06-20 LAB — CBC WITH DIFFERENTIAL/PLATELET
Absolute Monocytes: 831 cells/uL (ref 200–950)
Basophils Absolute: 57 cells/uL (ref 0–200)
Basophils Relative: 0.8 %
Eosinophils Absolute: 107 cells/uL (ref 15–500)
Eosinophils Relative: 1.5 %
HCT: 40.3 % (ref 35.0–45.0)
Hemoglobin: 13.8 g/dL (ref 11.7–15.5)
Lymphs Abs: 1874 cells/uL (ref 850–3900)
MCH: 32.5 pg (ref 27.0–33.0)
MCHC: 34.2 g/dL (ref 32.0–36.0)
MCV: 95 fL (ref 80.0–100.0)
MPV: 9.4 fL (ref 7.5–12.5)
Monocytes Relative: 11.7 %
Neutro Abs: 4232 cells/uL (ref 1500–7800)
Neutrophils Relative %: 59.6 %
Platelets: 259 10*3/uL (ref 140–400)
RBC: 4.24 10*6/uL (ref 3.80–5.10)
RDW: 14 % (ref 11.0–15.0)
Total Lymphocyte: 26.4 %
WBC: 7.1 10*3/uL (ref 3.8–10.8)

## 2020-06-20 LAB — COMPREHENSIVE METABOLIC PANEL
AG Ratio: 1.8 (calc) (ref 1.0–2.5)
ALT: 15 U/L (ref 6–29)
AST: 18 U/L (ref 10–35)
Albumin: 4.2 g/dL (ref 3.6–5.1)
Alkaline phosphatase (APISO): 89 U/L (ref 37–153)
BUN/Creatinine Ratio: 20 (calc) (ref 6–22)
BUN: 11 mg/dL (ref 7–25)
CO2: 25 mmol/L (ref 20–32)
Calcium: 9.2 mg/dL (ref 8.6–10.4)
Chloride: 106 mmol/L (ref 98–110)
Creat: 0.55 mg/dL — ABNORMAL LOW (ref 0.60–0.93)
Globulin: 2.4 g/dL (calc) (ref 1.9–3.7)
Glucose, Bld: 88 mg/dL (ref 65–99)
Potassium: 4.2 mmol/L (ref 3.5–5.3)
Sodium: 142 mmol/L (ref 135–146)
Total Bilirubin: 0.5 mg/dL (ref 0.2–1.2)
Total Protein: 6.6 g/dL (ref 6.1–8.1)

## 2020-06-20 LAB — THYROID PANEL WITH TSH
Free Thyroxine Index: 2 (ref 1.4–3.8)
T3 Uptake: 27 % (ref 22–35)
T4, Total: 7.4 ug/dL (ref 5.1–11.9)
TSH: 1.96 mIU/L (ref 0.40–4.50)

## 2020-06-20 LAB — LIPID PANEL
Cholesterol: 216 mg/dL — ABNORMAL HIGH (ref <200)
HDL: 45 mg/dL — ABNORMAL LOW (ref >=50)
LDL Cholesterol (Calc): 127 mg/dL (calc) — ABNORMAL HIGH
Non-HDL Cholesterol (Calc): 171 mg/dL (calc) — ABNORMAL HIGH (ref <130)
Total CHOL/HDL Ratio: 4.8 (calc) (ref <5.0)
Triglycerides: 283 mg/dL — ABNORMAL HIGH (ref <150)

## 2020-06-20 LAB — VITAMIN D 25 HYDROXY (VIT D DEFICIENCY, FRACTURES): Vit D, 25-Hydroxy: 26 ng/mL — ABNORMAL LOW (ref 30–100)

## 2020-06-20 LAB — HEMOGLOBIN A1C
Hgb A1c MFr Bld: 5.3 % of total Hgb (ref <5.7)
Mean Plasma Glucose: 105 mg/dL
eAG (mmol/L): 5.8 mmol/L

## 2020-06-20 LAB — HIGH SENSITIVITY CRP: hs-CRP: 3.4 mg/L — ABNORMAL HIGH

## 2020-06-20 NOTE — Telephone Encounter (Signed)
Placed in the mail today

## 2020-06-20 NOTE — Telephone Encounter (Signed)
Labs are notable for the following:  Vitamin D is low at 26.  I recommend taking vitamin D3 at 5000 IU daily.  Recheck in 6 to 12 months.  Thyroid function is normal.  Lipid panel is notable for triglycerides of 283 and HDL of 45.  When the triglycerides are more than doubled the HDL there is a higher risk of becoming diabetic.  As we discussed, it is important to minimize dietary intake of processed carbohydrates including breads, pastas, cereals, sugars and sweets.  Recheck lipids in about 6 months.  C-reactive protein, inflammation marker, is elevated at 3.4.  This increases long-term risk of cardiovascular disease.  Dietary changes mentioned above will help with this, as will smoking cessation.  Recheck this in 6 to 12 months.  Everything else looks good.

## 2020-07-16 DIAGNOSIS — M8580 Other specified disorders of bone density and structure, unspecified site: Secondary | ICD-10-CM | POA: Diagnosis not present

## 2020-07-16 DIAGNOSIS — M1990 Unspecified osteoarthritis, unspecified site: Secondary | ICD-10-CM | POA: Diagnosis not present

## 2020-07-16 DIAGNOSIS — M7062 Trochanteric bursitis, left hip: Secondary | ICD-10-CM | POA: Diagnosis not present

## 2020-07-16 DIAGNOSIS — M7550 Bursitis of unspecified shoulder: Secondary | ICD-10-CM | POA: Diagnosis not present

## 2020-07-16 DIAGNOSIS — G9050 Complex regional pain syndrome I, unspecified: Secondary | ICD-10-CM | POA: Diagnosis not present

## 2020-07-16 DIAGNOSIS — L4050 Arthropathic psoriasis, unspecified: Secondary | ICD-10-CM | POA: Diagnosis not present

## 2020-07-16 DIAGNOSIS — M069 Rheumatoid arthritis, unspecified: Secondary | ICD-10-CM | POA: Diagnosis not present

## 2020-07-16 DIAGNOSIS — M4800 Spinal stenosis, site unspecified: Secondary | ICD-10-CM | POA: Diagnosis not present

## 2020-07-16 DIAGNOSIS — M353 Polymyalgia rheumatica: Secondary | ICD-10-CM | POA: Diagnosis not present

## 2020-07-16 DIAGNOSIS — Z79899 Other long term (current) drug therapy: Secondary | ICD-10-CM | POA: Diagnosis not present

## 2020-07-16 DIAGNOSIS — M797 Fibromyalgia: Secondary | ICD-10-CM | POA: Diagnosis not present

## 2020-07-17 DIAGNOSIS — Z79899 Other long term (current) drug therapy: Secondary | ICD-10-CM | POA: Diagnosis not present

## 2020-08-23 ENCOUNTER — Other Ambulatory Visit: Payer: Self-pay | Admitting: Family Medicine

## 2020-09-20 DIAGNOSIS — M797 Fibromyalgia: Secondary | ICD-10-CM | POA: Diagnosis not present

## 2020-09-20 DIAGNOSIS — M069 Rheumatoid arthritis, unspecified: Secondary | ICD-10-CM | POA: Diagnosis not present

## 2020-09-20 DIAGNOSIS — M8580 Other specified disorders of bone density and structure, unspecified site: Secondary | ICD-10-CM | POA: Diagnosis not present

## 2020-09-20 DIAGNOSIS — M4800 Spinal stenosis, site unspecified: Secondary | ICD-10-CM | POA: Diagnosis not present

## 2020-09-20 DIAGNOSIS — G9050 Complex regional pain syndrome I, unspecified: Secondary | ICD-10-CM | POA: Diagnosis not present

## 2020-09-20 DIAGNOSIS — Z79899 Other long term (current) drug therapy: Secondary | ICD-10-CM | POA: Diagnosis not present

## 2020-09-20 DIAGNOSIS — R5383 Other fatigue: Secondary | ICD-10-CM | POA: Diagnosis not present

## 2020-09-20 DIAGNOSIS — M7062 Trochanteric bursitis, left hip: Secondary | ICD-10-CM | POA: Diagnosis not present

## 2020-09-20 DIAGNOSIS — M7550 Bursitis of unspecified shoulder: Secondary | ICD-10-CM | POA: Diagnosis not present

## 2020-09-20 DIAGNOSIS — M353 Polymyalgia rheumatica: Secondary | ICD-10-CM | POA: Diagnosis not present

## 2020-09-20 DIAGNOSIS — L4050 Arthropathic psoriasis, unspecified: Secondary | ICD-10-CM | POA: Diagnosis not present

## 2020-09-20 DIAGNOSIS — M1990 Unspecified osteoarthritis, unspecified site: Secondary | ICD-10-CM | POA: Diagnosis not present

## 2020-10-16 NOTE — Progress Notes (Signed)
NEUROLOGY FOLLOW UP OFFICE NOTE  [name] [id number]  Assessment/Plan:   Hemiplegic migraine Ocular migraine Complex regional pain syndrome of right upper extremity Subjective memory deficits - I do not appreciate cognitive impairment at this time.  Monitor.  Citalopram Follow up one year   Subjective:  Lacey Salas is a 75 year old right-handed woman with RA, anxiety, depression, migraine, complex regional pain syndrome of right hand, hyperlipidemia, osteopenia, MVP and Stevens-Johnson syndrome who follows up for complicated migraines (hemiplegic migraine and ocular migraine).   UPDATE: Amitriptyline was discontinued.  She was started on citalopram which has helped with migraines and anxiety.  No migraines.  She is concerned about her memory.  She reports word-finding issues, trouble remembering names.  May forget what she ate for breakfast that morning.  Able to pay bills without difficulty and no significant disorientation driving on familiar routes.  Thyroid level in April was normal.   Current NSAIDS:  ASA 81mg  daily Current analgesics:  Tylenol Current triptans:  no Current anti-emetic:  no Current muscle relaxants:  no Current anti-anxiolytic:  none Current sleep aide:  no Current Antihypertensive medications:  atenolol 25mg  Current Antidepressant medications:  citalopram 40mg  Current Anticonvulsant medications:  no Current Vitamins/Herbal/Supplements:  no Current Antihistamines/Decongestants:  no Other therapy:  no Prednisone and Plaquenil for RA   Caffeine:  no Alcohol:  no Smoker:  Quit 34 years ago but started again due to recent tragedy Depression:  Significant depression and anxiety.   HISTORY: She has been evaluated by several neurologists and headache specialists, such as Dr. Avie Echevaria, Dr. Fonnie Jarvis, Dr. Rudene Christians, Dr. Wandra Arthurs, and Dr. Marcelino Freestone. Occurs spontaneously, including while driving. Onset:  Has history of typical  migraines (with headache, nausea and vomiting) since age 18.  This subsided and started to have these current spells starting in her early 38s.  70% of the time it is accompanied by headache. Location:  holocephalic Quality:  Non-throbbing pressure Initial intensity:  5/10 Aura:  no Prodrome:  Indescribable "foreboding" feeling Associated symptoms:  Migraines typically present as TIA-like symptoms, including right-sided weakness involving face, arm and leg, photophobia, word-finding difficulties, difficulty getting words out, slurred speech, staggering gait.  She is conscious and alert but doesn't feel like she can function during the spell.  Following episodes, she is fatigued and will sleep for 2-3 hours.  On rare occasions, accompanied by visual field cut. Initial duration:  30 minutes Initial frequency:  Varies.  Usually once a month but may be 3 times a week. Triggers:  Chemical smells, some perfumes, bright lights Relieving factors:  Laying down Activity:  Cannot function   MRI/MRA of head from 06/15/08 were unremarkable. EEGs from 11/02/12 and 09/27/13 were normal.   Past abortive therapy:  none Past preventative therapy:  Amitriptyline (initially for complex regional pain syndrome), venlafaxine (racing thoughts), Sinequan, onriboflavin, Cymbalta, citalopram   Family history of headache:  Maternal grandmother, sister   Neuropathy: She reports numbness, tingling and burning in the feet.  No weakness or radicular back pain.  Sed rate mildly elevated at 42.  ANA, CRP, SPEP/IFE, TSH and B12 (610) were unremarkable.  She is seeing rheumatology.   Visual Disturbance: In June 2020, she had episode of sensation that eyes are crossing but for 10-15 seconds but denies actually seeing double vision or blurred vision, occurring once or twice a day and may go several days before it occurs again.  No associated headache.  Sometimes she thinks she sees something in  her peripheral vision that isn't  there.  One time, she thought she saw a fire on the burner which isn't there..  She also notes uncontrolled twitching of the legs or upper part of her body, lasting a few seconds.  She also has been feeling lightheaded.  This started prior to starting prednisone and Plaquenil.  MRI of brain without contrast was performed on 10/20/2018 demonstrated moderate chronic small vessel ischemic changes in the cerebral white matter and pons.  Due to endorsing myoclonus, MRI of cervical spine was also performed and showed noncompressive degenerative changes but normal cervical spinal cord. She had a formal eye exam which was unremarkable.  No longer having these symptoms.  Unclear if it may have been related to prednisone.  PAST MEDICAL HISTORY: Past Medical History:  Diagnosis Date   Anxiety    Depression    GERD (gastroesophageal reflux disease)    History of chicken pox    Hyperlipidemia    Migraine    complicated migraines with transient R side facial paralysis and aphasia   Migraine headache with aura    hemiplegic migraine   MVP (mitral valve prolapse)    Osteopenia    Reflex sympathetic dystrophy, unspecified 2011   R hand related to wrist fx, improved s/p nerve blocks   Seizures (HCC)    Stevens-Johnson disease (HCC)     MEDICATIONS: Current Outpatient Medications on File Prior to Visit  Medication Sig Dispense Refill   Acetaminophen (TYLENOL 8 HOUR PO) Take by mouth daily.     atenolol (TENORMIN) 25 MG tablet TAKE 1 TABLET BY MOUTH EVERY DAY 90 tablet 2   citalopram (CELEXA) 40 MG tablet TAKE 1 TABLET BY MOUTH EVERY DAY 90 tablet 1   esomeprazole (NEXIUM) 20 MG capsule Take 1 capsule (20 mg total) by mouth daily before breakfast. OVC     folic acid (FOLVITE) 1 MG tablet Take 1 mg by mouth daily.     hydroxychloroquine (PLAQUENIL) 200 MG tablet TAKE 2 TABLETS BY MOUTH EVERY DAY (M06.9)     methotrexate (RHEUMATREX) 2.5 MG tablet Take 6 tablets by mouth once a week.     Multiple  Vitamins-Minerals (VITAMIN D3 COMPLETE PO) Take by mouth daily.     No current facility-administered medications on file prior to visit.    ALLERGIES: Allergies  Allergen Reactions   Cephalexin     REACTION: Levonne Spiller   Epinephrine     REACTION: hypertensive reaction   Erythromycin     REACTION: Rash   Lactated Ringers     REACTION: cardiac symptoms   Nortriptyline     Rapid heart rate   Penicillins     REACTION: Rash   Sulfamethoxazole-Trimethoprim     REACTION: Levonne Spiller   Sulfonamide Derivatives    Trimethoprim     REACTION: stevens johnson syndrome   Wellbutrin [Bupropion] Other (See Comments)    tremors    FAMILY HISTORY: Family History  Problem Relation Age of Onset   Leukemia Brother        CLL   Cancer Brother    Arthritis Mother    Arthritis Father    Breast cancer Sister    Cancer Sister    Arthritis Other    Ovarian cancer Other    Hyperlipidemia Other    Hypertension Other    Stroke Other    Colon cancer Neg Hx       Objective:  Blood pressure 129/69, pulse 78, height 5\' 3"  (1.6 m), weight 149  lb 9.6 oz (67.9 kg), SpO2 96 %. General: No acute distress.  Patient appears well-groomed.   Head:  Normocephalic/atraumatic Eyes:  Fundi examined but not visualized Neck: supple, no paraspinal tenderness, full range of motion Heart:  Regular rate and rhythm Lungs:  Clear to auscultation bilaterally Back: No paraspinal tenderness Neurological Exam: alert and oriented to person, place, and time.  Speech fluent and not dysarthric, language intact.   [employer] Mental Exam 10/17/2020  Weekday Correct 1  Current year 1  What state are we in? 1  Amount spent 1  Amount left 2  # of Animals 3  5 objects recall 3  Number series 2  Hour markers 2  Time correct 2  Placed X in triangle correctly 1  Largest Figure 1  Name of female 2  Date back to work 2  Type of work 2  State she lived in 0  Total score 26   CN II-XII intact. Bulk  and tone normal, muscle strength 5/5 throughout.  Sensation to light touch intact.  Deep tendon reflexes 2+ throughout, toes downgoing.  Finger to nose testing intact.  Gait normal, Romberg negative.   Shon Millet, DO  CC: Lavada Mesi, MD

## 2020-10-17 ENCOUNTER — Other Ambulatory Visit: Payer: Self-pay

## 2020-10-17 ENCOUNTER — Encounter: Payer: Self-pay | Admitting: Neurology

## 2020-10-17 ENCOUNTER — Ambulatory Visit: Payer: Medicare Other | Admitting: Neurology

## 2020-10-17 VITALS — BP 129/69 | HR 78 | Ht 63.0 in | Wt 149.6 lb

## 2020-10-17 DIAGNOSIS — R4189 Other symptoms and signs involving cognitive functions and awareness: Secondary | ICD-10-CM

## 2020-10-17 DIAGNOSIS — G43409 Hemiplegic migraine, not intractable, without status migrainosus: Secondary | ICD-10-CM | POA: Diagnosis not present

## 2020-10-17 DIAGNOSIS — G90511 Complex regional pain syndrome I of right upper limb: Secondary | ICD-10-CM

## 2020-10-17 DIAGNOSIS — G43109 Migraine with aura, not intractable, without status migrainosus: Secondary | ICD-10-CM

## 2020-10-18 ENCOUNTER — Ambulatory Visit (INDEPENDENT_AMBULATORY_CARE_PROVIDER_SITE_OTHER): Payer: Medicare Other | Admitting: Family Medicine

## 2020-10-18 ENCOUNTER — Encounter: Payer: Self-pay | Admitting: Family Medicine

## 2020-10-18 VITALS — BP 123/67 | HR 74 | Ht 63.0 in | Wt 148.8 lb

## 2020-10-18 DIAGNOSIS — Z720 Tobacco use: Secondary | ICD-10-CM | POA: Diagnosis not present

## 2020-10-18 DIAGNOSIS — R059 Cough, unspecified: Secondary | ICD-10-CM | POA: Diagnosis not present

## 2020-10-18 NOTE — Progress Notes (Signed)
Office Visit Note   Patient: Lacey Salas           Date of Birth: March 18, 1945           MRN: [id number] Visit Date: 10/18/2020 Requested by: Lavada Mesi, MD [address] [place],  [place] [address] PCP: Lavada Mesi, MD  Subjective: Chief Complaint  Patient presents with   Other    Waking up every morning with dry mouth and cough with mucous. Occasionally, food will get stuck in her throat.    HPI: She is here with a couple concerns.  For the past month or 2 she has had dry mouth when she wakes up in the morning, and an occasional cough with mucus.  No fevers or chills.  She occasionally has food gets stuck in her throat.  She also has not been able to quit smoking again.  She is very disappointed at this.  She is concerned that her smoking has potentially lead to a throat cancer.               ROS: No fevers or chills, no night sweats.  All other systems were reviewed and are negative.  Objective: Vital Signs: BP 123/67 (BP Location: Right Arm, Patient Position: Sitting, Cuff Size: Normal)   Pulse 74   Ht 5\' 3"  (1.6 m)   Wt 148 lb 12.8 oz (67.5 kg)   BMI 26.36 kg/m   Physical Exam:  General:  Alert and oriented, in no acute distress. Pulm:  Breathing unlabored. Psy:  Normal mood, congruent affect.  HEENT: Oropharynx looks clear, no suspicious lesions.  Neck has no lymphadenopathy.    Imaging: No results found.  Assessment & Plan: Concerns about throat -Reassurance that today I am not seeing anything worrisome.  However, she will keep an eye on things and if her symptoms persist beyond another month, I would recommend consulting with ENT for further evaluation.  2.  Tobacco abuse - She will try NicoDerm patch therapy.     Procedures: No procedures performed        PMFS History: Patient Active Problem List   Diagnosis Date Noted   Osteopenia 06/19/2020   Vitamin D deficiency 10/18/2018   Chronic cough 11/16/2017   Chronic fatigue 11/16/2017    Irregular heart rate 11/16/2017   Polyarthralgia 02/24/2017   Fibromyalgia 02/24/2017   RSD upper limb 09/02/2015   Hemiplegic migraine without status migrainosus, not intractable 12/05/2013   HYPERSOMNIA 04/04/2010   Hyperlipidemia 10/12/2006   Anxiety and depression 10/11/2006   Adjustment disorder with mixed anxiety and depressed mood 10/11/2006   GERD 10/11/2006   Disorder of bone and cartilage 10/11/2006   STEVENS-JOHNSON SYNDROME, HX OF 10/11/2006   Past Medical History:  Diagnosis Date   Anxiety    Depression    GERD (gastroesophageal reflux disease)    History of chicken pox    Hyperlipidemia    Migraine    complicated migraines with transient R side facial paralysis and aphasia   Migraine headache with aura    hemiplegic migraine   MVP (mitral valve prolapse)    Osteopenia    Reflex sympathetic dystrophy, unspecified 2011   R hand related to wrist fx, improved s/p nerve blocks   Seizures (HCC)    Stevens-Johnson disease (HCC)     Family History  Problem Relation Age of Onset   Leukemia Brother        CLL   Cancer Brother    Arthritis Mother    Arthritis Father  Breast cancer Sister    Cancer Sister    Arthritis Other    Ovarian cancer Other    Hyperlipidemia Other    Hypertension Other    Stroke Other    Colon cancer Neg Hx     Past Surgical History:  Procedure Laterality Date   ABDOMINAL HYSTERECTOMY     BSO   CATARACT EXTRACTION     HEEL SPUR SURGERY  12/1996   right   ORIF DISTAL RADIUS FRACTURE  2004   Social History   Occupational History    Employer: RETIRED  Tobacco Use   Smoking status: Every Day    Packs/day: 1.00    Types: Cigarettes   Smokeless tobacco: Never   Tobacco comments:    Quit over 30 years ago( just needs it a bit now ) Stress   Substance and Sexual Activity   Alcohol use: No   Drug use: No   Sexual activity: Never

## 2020-10-22 ENCOUNTER — Other Ambulatory Visit: Payer: Self-pay | Admitting: Family Medicine

## 2020-10-22 ENCOUNTER — Ambulatory Visit: Payer: Medicare Other | Admitting: Neurology

## 2020-10-29 DIAGNOSIS — H52202 Unspecified astigmatism, left eye: Secondary | ICD-10-CM | POA: Diagnosis not present

## 2020-10-29 DIAGNOSIS — H43813 Vitreous degeneration, bilateral: Secondary | ICD-10-CM | POA: Diagnosis not present

## 2020-10-29 DIAGNOSIS — Z79899 Other long term (current) drug therapy: Secondary | ICD-10-CM | POA: Diagnosis not present

## 2020-12-25 DIAGNOSIS — M353 Polymyalgia rheumatica: Secondary | ICD-10-CM | POA: Diagnosis not present

## 2020-12-25 DIAGNOSIS — M069 Rheumatoid arthritis, unspecified: Secondary | ICD-10-CM | POA: Diagnosis not present

## 2020-12-25 DIAGNOSIS — G9050 Complex regional pain syndrome I, unspecified: Secondary | ICD-10-CM | POA: Diagnosis not present

## 2020-12-25 DIAGNOSIS — M4800 Spinal stenosis, site unspecified: Secondary | ICD-10-CM | POA: Diagnosis not present

## 2020-12-25 DIAGNOSIS — M8580 Other specified disorders of bone density and structure, unspecified site: Secondary | ICD-10-CM | POA: Diagnosis not present

## 2020-12-25 DIAGNOSIS — M1990 Unspecified osteoarthritis, unspecified site: Secondary | ICD-10-CM | POA: Diagnosis not present

## 2020-12-25 DIAGNOSIS — M7550 Bursitis of unspecified shoulder: Secondary | ICD-10-CM | POA: Diagnosis not present

## 2020-12-25 DIAGNOSIS — M7062 Trochanteric bursitis, left hip: Secondary | ICD-10-CM | POA: Diagnosis not present

## 2020-12-25 DIAGNOSIS — M797 Fibromyalgia: Secondary | ICD-10-CM | POA: Diagnosis not present

## 2020-12-25 DIAGNOSIS — Z79899 Other long term (current) drug therapy: Secondary | ICD-10-CM | POA: Diagnosis not present

## 2020-12-25 DIAGNOSIS — R5383 Other fatigue: Secondary | ICD-10-CM | POA: Diagnosis not present

## 2020-12-25 DIAGNOSIS — L4050 Arthropathic psoriasis, unspecified: Secondary | ICD-10-CM | POA: Diagnosis not present

## 2020-12-30 DIAGNOSIS — R5383 Other fatigue: Secondary | ICD-10-CM | POA: Diagnosis not present

## 2020-12-30 DIAGNOSIS — R002 Palpitations: Secondary | ICD-10-CM | POA: Diagnosis not present

## 2020-12-30 DIAGNOSIS — R29818 Other symptoms and signs involving the nervous system: Secondary | ICD-10-CM | POA: Diagnosis not present

## 2020-12-30 DIAGNOSIS — R0602 Shortness of breath: Secondary | ICD-10-CM | POA: Diagnosis not present

## 2020-12-30 DIAGNOSIS — G43909 Migraine, unspecified, not intractable, without status migrainosus: Secondary | ICD-10-CM | POA: Diagnosis not present

## 2021-01-02 DIAGNOSIS — R0602 Shortness of breath: Secondary | ICD-10-CM | POA: Diagnosis not present

## 2021-01-20 DIAGNOSIS — R29818 Other symptoms and signs involving the nervous system: Secondary | ICD-10-CM | POA: Diagnosis not present

## 2021-01-20 DIAGNOSIS — L4050 Arthropathic psoriasis, unspecified: Secondary | ICD-10-CM | POA: Diagnosis not present

## 2021-01-20 DIAGNOSIS — M25552 Pain in left hip: Secondary | ICD-10-CM | POA: Diagnosis not present

## 2021-01-20 DIAGNOSIS — M069 Rheumatoid arthritis, unspecified: Secondary | ICD-10-CM | POA: Diagnosis not present

## 2021-01-20 DIAGNOSIS — R5383 Other fatigue: Secondary | ICD-10-CM | POA: Diagnosis not present

## 2021-02-12 DIAGNOSIS — M25561 Pain in right knee: Secondary | ICD-10-CM | POA: Diagnosis not present

## 2021-02-12 DIAGNOSIS — M25552 Pain in left hip: Secondary | ICD-10-CM | POA: Diagnosis not present

## 2021-02-28 ENCOUNTER — Other Ambulatory Visit: Payer: Self-pay | Admitting: Neurology

## 2021-03-01 ENCOUNTER — Other Ambulatory Visit: Payer: Self-pay | Admitting: Neurology

## 2021-03-03 ENCOUNTER — Telehealth: Payer: Self-pay | Admitting: Neurology

## 2021-03-03 NOTE — Telephone Encounter (Signed)
Medication refused last week. Pt last seen 10/2020.  Will ask if patient okay to fill.

## 2021-03-03 NOTE — Telephone Encounter (Signed)
Pt took last one today

## 2021-03-04 ENCOUNTER — Telehealth: Payer: Self-pay | Admitting: Neurology

## 2021-03-04 ENCOUNTER — Other Ambulatory Visit: Payer: Self-pay

## 2021-03-04 MED ORDER — CITALOPRAM HYDROBROMIDE 40 MG PO TABS: 40.0000 mg | ORAL_TABLET | Freq: Every day | ORAL | 0 refills | Status: DC

## 2021-03-04 NOTE — Telephone Encounter (Signed)
Patient said she still has not received a refill for her citalopram. Is completely out, and she was told not to skip this medication.

## 2021-03-04 NOTE — Telephone Encounter (Signed)
Refill sent in for pt. 

## 2021-03-04 NOTE — Telephone Encounter (Signed)
Pt called refill sent in, seen where [facility] had filled in the past for pt,

## 2021-03-26 DIAGNOSIS — L4050 Arthropathic psoriasis, unspecified: Secondary | ICD-10-CM | POA: Diagnosis not present

## 2021-03-26 DIAGNOSIS — M797 Fibromyalgia: Secondary | ICD-10-CM | POA: Diagnosis not present

## 2021-03-26 DIAGNOSIS — M069 Rheumatoid arthritis, unspecified: Secondary | ICD-10-CM | POA: Diagnosis not present

## 2021-03-26 DIAGNOSIS — M353 Polymyalgia rheumatica: Secondary | ICD-10-CM | POA: Diagnosis not present

## 2021-03-26 DIAGNOSIS — M7062 Trochanteric bursitis, left hip: Secondary | ICD-10-CM | POA: Diagnosis not present

## 2021-03-26 DIAGNOSIS — G9050 Complex regional pain syndrome I, unspecified: Secondary | ICD-10-CM | POA: Diagnosis not present

## 2021-03-26 DIAGNOSIS — M4800 Spinal stenosis, site unspecified: Secondary | ICD-10-CM | POA: Diagnosis not present

## 2021-03-26 DIAGNOSIS — M7550 Bursitis of unspecified shoulder: Secondary | ICD-10-CM | POA: Diagnosis not present

## 2021-03-26 DIAGNOSIS — M1990 Unspecified osteoarthritis, unspecified site: Secondary | ICD-10-CM | POA: Diagnosis not present

## 2021-03-26 DIAGNOSIS — Z79899 Other long term (current) drug therapy: Secondary | ICD-10-CM | POA: Diagnosis not present

## 2021-03-26 DIAGNOSIS — M8580 Other specified disorders of bone density and structure, unspecified site: Secondary | ICD-10-CM | POA: Diagnosis not present

## 2021-03-26 DIAGNOSIS — R5383 Other fatigue: Secondary | ICD-10-CM | POA: Diagnosis not present

## 2021-04-07 DIAGNOSIS — M069 Rheumatoid arthritis, unspecified: Secondary | ICD-10-CM | POA: Diagnosis not present

## 2021-04-07 DIAGNOSIS — Z0000 Encounter for general adult medical examination without abnormal findings: Secondary | ICD-10-CM | POA: Diagnosis not present

## 2021-04-07 DIAGNOSIS — R5383 Other fatigue: Secondary | ICD-10-CM | POA: Diagnosis not present

## 2021-04-07 DIAGNOSIS — R1013 Epigastric pain: Secondary | ICD-10-CM | POA: Diagnosis not present

## 2021-04-07 DIAGNOSIS — R29818 Other symptoms and signs involving the nervous system: Secondary | ICD-10-CM | POA: Diagnosis not present

## 2021-04-07 DIAGNOSIS — L4050 Arthropathic psoriasis, unspecified: Secondary | ICD-10-CM | POA: Diagnosis not present

## 2021-04-21 DIAGNOSIS — M25552 Pain in left hip: Secondary | ICD-10-CM | POA: Diagnosis not present

## 2021-05-12 DIAGNOSIS — M25552 Pain in left hip: Secondary | ICD-10-CM | POA: Diagnosis not present

## 2021-05-15 DIAGNOSIS — G9050 Complex regional pain syndrome I, unspecified: Secondary | ICD-10-CM | POA: Diagnosis not present

## 2021-05-15 DIAGNOSIS — L4050 Arthropathic psoriasis, unspecified: Secondary | ICD-10-CM | POA: Diagnosis not present

## 2021-05-15 DIAGNOSIS — M069 Rheumatoid arthritis, unspecified: Secondary | ICD-10-CM | POA: Diagnosis not present

## 2021-05-15 DIAGNOSIS — E782 Mixed hyperlipidemia: Secondary | ICD-10-CM | POA: Diagnosis not present

## 2021-05-15 DIAGNOSIS — Z0000 Encounter for general adult medical examination without abnormal findings: Secondary | ICD-10-CM | POA: Diagnosis not present

## 2021-05-19 DIAGNOSIS — M25652 Stiffness of left hip, not elsewhere classified: Secondary | ICD-10-CM | POA: Diagnosis not present

## 2021-05-19 DIAGNOSIS — M6281 Muscle weakness (generalized): Secondary | ICD-10-CM | POA: Diagnosis not present

## 2021-05-19 DIAGNOSIS — S76912D Strain of unspecified muscles, fascia and tendons at thigh level, left thigh, subsequent encounter: Secondary | ICD-10-CM | POA: Diagnosis not present

## 2021-05-28 DIAGNOSIS — S76912D Strain of unspecified muscles, fascia and tendons at thigh level, left thigh, subsequent encounter: Secondary | ICD-10-CM | POA: Diagnosis not present

## 2021-05-28 DIAGNOSIS — M25652 Stiffness of left hip, not elsewhere classified: Secondary | ICD-10-CM | POA: Diagnosis not present

## 2021-05-28 DIAGNOSIS — M6281 Muscle weakness (generalized): Secondary | ICD-10-CM | POA: Diagnosis not present

## 2021-05-30 ENCOUNTER — Other Ambulatory Visit: Payer: Self-pay | Admitting: Neurology

## 2021-06-02 ENCOUNTER — Other Ambulatory Visit: Payer: Self-pay | Admitting: Neurology

## 2021-06-02 ENCOUNTER — Telehealth: Payer: Self-pay | Admitting: Neurology

## 2021-06-02 MED ORDER — CITALOPRAM HYDROBROMIDE 40 MG PO TABS: 40.0000 mg | ORAL_TABLET | Freq: Every day | ORAL | 1 refills | Status: DC

## 2021-06-02 NOTE — Telephone Encounter (Signed)
Refilled

## 2021-06-02 NOTE — Telephone Encounter (Signed)
Patient is calling regarding a refill on her citalopram. ? ?[facility] [phone] phone number ?

## 2021-06-03 DIAGNOSIS — M25652 Stiffness of left hip, not elsewhere classified: Secondary | ICD-10-CM | POA: Diagnosis not present

## 2021-06-03 DIAGNOSIS — S76912D Strain of unspecified muscles, fascia and tendons at thigh level, left thigh, subsequent encounter: Secondary | ICD-10-CM | POA: Diagnosis not present

## 2021-06-03 DIAGNOSIS — M6281 Muscle weakness (generalized): Secondary | ICD-10-CM | POA: Diagnosis not present

## 2021-06-05 DIAGNOSIS — M25652 Stiffness of left hip, not elsewhere classified: Secondary | ICD-10-CM | POA: Diagnosis not present

## 2021-06-05 DIAGNOSIS — M6281 Muscle weakness (generalized): Secondary | ICD-10-CM | POA: Diagnosis not present

## 2021-06-05 DIAGNOSIS — S76912D Strain of unspecified muscles, fascia and tendons at thigh level, left thigh, subsequent encounter: Secondary | ICD-10-CM | POA: Diagnosis not present

## 2021-06-09 DIAGNOSIS — M545 Low back pain, unspecified: Secondary | ICD-10-CM | POA: Diagnosis not present

## 2021-06-09 DIAGNOSIS — M25552 Pain in left hip: Secondary | ICD-10-CM | POA: Diagnosis not present

## 2021-06-10 DIAGNOSIS — M25652 Stiffness of left hip, not elsewhere classified: Secondary | ICD-10-CM | POA: Diagnosis not present

## 2021-06-10 DIAGNOSIS — S76912D Strain of unspecified muscles, fascia and tendons at thigh level, left thigh, subsequent encounter: Secondary | ICD-10-CM | POA: Diagnosis not present

## 2021-06-10 DIAGNOSIS — M6281 Muscle weakness (generalized): Secondary | ICD-10-CM | POA: Diagnosis not present

## 2021-06-12 DIAGNOSIS — S76912D Strain of unspecified muscles, fascia and tendons at thigh level, left thigh, subsequent encounter: Secondary | ICD-10-CM | POA: Diagnosis not present

## 2021-06-12 DIAGNOSIS — M25652 Stiffness of left hip, not elsewhere classified: Secondary | ICD-10-CM | POA: Diagnosis not present

## 2021-06-12 DIAGNOSIS — M6281 Muscle weakness (generalized): Secondary | ICD-10-CM | POA: Diagnosis not present

## 2021-06-17 DIAGNOSIS — M25552 Pain in left hip: Secondary | ICD-10-CM | POA: Diagnosis not present

## 2021-06-17 DIAGNOSIS — M25652 Stiffness of left hip, not elsewhere classified: Secondary | ICD-10-CM | POA: Diagnosis not present

## 2021-06-17 DIAGNOSIS — M545 Low back pain, unspecified: Secondary | ICD-10-CM | POA: Diagnosis not present

## 2021-06-17 DIAGNOSIS — M6281 Muscle weakness (generalized): Secondary | ICD-10-CM | POA: Diagnosis not present

## 2021-06-17 DIAGNOSIS — S76912D Strain of unspecified muscles, fascia and tendons at thigh level, left thigh, subsequent encounter: Secondary | ICD-10-CM | POA: Diagnosis not present

## 2021-06-19 DIAGNOSIS — S76912D Strain of unspecified muscles, fascia and tendons at thigh level, left thigh, subsequent encounter: Secondary | ICD-10-CM | POA: Diagnosis not present

## 2021-06-19 DIAGNOSIS — M6281 Muscle weakness (generalized): Secondary | ICD-10-CM | POA: Diagnosis not present

## 2021-06-19 DIAGNOSIS — M25652 Stiffness of left hip, not elsewhere classified: Secondary | ICD-10-CM | POA: Diagnosis not present

## 2021-07-07 DIAGNOSIS — M25552 Pain in left hip: Secondary | ICD-10-CM | POA: Diagnosis not present

## 2021-07-15 DIAGNOSIS — F17210 Nicotine dependence, cigarettes, uncomplicated: Secondary | ICD-10-CM | POA: Diagnosis not present

## 2021-07-15 DIAGNOSIS — M5416 Radiculopathy, lumbar region: Secondary | ICD-10-CM | POA: Diagnosis not present

## 2021-07-30 DIAGNOSIS — M5416 Radiculopathy, lumbar region: Secondary | ICD-10-CM | POA: Diagnosis not present

## 2021-08-02 ENCOUNTER — Emergency Department (HOSPITAL_COMMUNITY): Payer: Medicare Other

## 2021-08-02 ENCOUNTER — Encounter (HOSPITAL_COMMUNITY): Payer: Self-pay | Admitting: *Deleted

## 2021-08-02 ENCOUNTER — Other Ambulatory Visit: Payer: Self-pay

## 2021-08-02 ENCOUNTER — Emergency Department (HOSPITAL_COMMUNITY)
Admission: EM | Admit: 2021-08-02 | Discharge: 2021-08-02 | Disposition: A | Discharge status: Home / Self Care | Payer: Medicare Other | Attending: Student | Admitting: Student

## 2021-08-02 DIAGNOSIS — R251 Tremor, unspecified: Secondary | ICD-10-CM | POA: Insufficient documentation

## 2021-08-02 DIAGNOSIS — F17210 Nicotine dependence, cigarettes, uncomplicated: Secondary | ICD-10-CM | POA: Diagnosis not present

## 2021-08-02 DIAGNOSIS — R0600 Dyspnea, unspecified: Secondary | ICD-10-CM | POA: Diagnosis not present

## 2021-08-02 DIAGNOSIS — R9431 Abnormal electrocardiogram [ECG] [EKG]: Secondary | ICD-10-CM | POA: Diagnosis not present

## 2021-08-02 DIAGNOSIS — G43809 Other migraine, not intractable, without status migrainosus: Secondary | ICD-10-CM

## 2021-08-02 DIAGNOSIS — R519 Headache, unspecified: Secondary | ICD-10-CM | POA: Diagnosis not present

## 2021-08-02 DIAGNOSIS — D72829 Elevated white blood cell count, unspecified: Secondary | ICD-10-CM | POA: Diagnosis not present

## 2021-08-02 DIAGNOSIS — R5383 Other fatigue: Secondary | ICD-10-CM | POA: Diagnosis not present

## 2021-08-02 DIAGNOSIS — R110 Nausea: Secondary | ICD-10-CM | POA: Diagnosis not present

## 2021-08-02 LAB — COMPREHENSIVE METABOLIC PANEL
ALT: 17 U/L (ref 0–44)
AST: 20 U/L (ref 15–41)
Albumin: 3.7 g/dL (ref 3.5–5.0)
Alkaline Phosphatase: 82 U/L (ref 38–126)
Anion gap: 10 (ref 5–15)
BUN: 16 mg/dL (ref 8–23)
CO2: 21 mmol/L — ABNORMAL LOW (ref 22–32)
Calcium: 9.2 mg/dL (ref 8.9–10.3)
Chloride: 110 mmol/L (ref 98–111)
Creatinine, Ser: 0.67 mg/dL (ref 0.44–1.00)
GFR, Estimated: >60 mL/min (ref >60)
Glucose, Bld: 118 mg/dL — ABNORMAL HIGH (ref 70–99)
Potassium: 3.6 mmol/L (ref 3.5–5.1)
Sodium: 141 mmol/L (ref 135–145)
Total Bilirubin: 0.5 mg/dL (ref 0.3–1.2)
Total Protein: 6.7 g/dL (ref 6.5–8.1)

## 2021-08-02 LAB — CBC
HCT: 45.1 % (ref 36.0–46.0)
Hemoglobin: 15.1 g/dL — ABNORMAL HIGH (ref 12.0–15.0)
MCH: 30.4 pg (ref 26.0–34.0)
MCHC: 33.5 g/dL (ref 30.0–36.0)
MCV: 90.9 fL (ref 80.0–100.0)
Platelets: 360 10*3/uL (ref 150–400)
RBC: 4.96 MIL/uL (ref 3.87–5.11)
RDW: 13.3 % (ref 11.5–15.5)
WBC: 11.5 10*3/uL — ABNORMAL HIGH (ref 4.0–10.5)
nRBC: 0 % (ref 0.0–0.2)

## 2021-08-02 LAB — LIPASE, BLOOD: Lipase: 27 U/L (ref 11–51)

## 2021-08-02 MED ORDER — SODIUM CHLORIDE 0.9 % IV BOLUS
1000.0000 mL | Freq: Once | INTRAVENOUS | Status: AC
  Administered 2021-08-02: 1000 mL via INTRAVENOUS

## 2021-08-02 MED ORDER — PROCHLORPERAZINE EDISYLATE 10 MG/2ML IJ SOLN
10.0000 mg | Freq: Once | INTRAMUSCULAR | Status: AC
  Administered 2021-08-02: 10 mg via INTRAVENOUS
  Filled 2021-08-02: qty 2

## 2021-08-02 MED ORDER — LORAZEPAM 2 MG/ML IJ SOLN
0.5000 mg | Freq: Once | INTRAMUSCULAR | Status: AC
  Administered 2021-08-02: 0.5 mg via INTRAVENOUS
  Filled 2021-08-02: qty 1

## 2021-08-02 NOTE — ED Triage Notes (Signed)
Pt received a steroid shot to her back on Wednesday, now has weakness, shaking, anxious and headache.

## 2021-08-02 NOTE — ED Provider Notes (Signed)
[facility]-EMERGENCY DEPT Provider Note  CSN: [id number] Arrival date & time: 08/02/21 1132  Chief Complaint(s) Nausea, Headache, and Shaking  HPI Lacey Salas is a 76 y.o. female with PMH complicated migraines, MVP, Stevens-Johnson's, seizure disorder who presents emergency department for evaluation of nausea, headache and tremors.  Patient states that she received a cortisone injection in her back last week and has had a gradually worsening sensation of headache, tremors and fatigue.  She states that her previous history of hemiplegic migraines felt very similar to the way she feels right now.  She states she has not had a migraine in many years.  She states she spoke with orthopedic surgeon who instructed her to come to the emergency department for evaluation due to concern for possible low pressure headache.  She denies chest pain, shortness of breath, fever, nausea, vomiting or other systemic symptoms.   Past Medical History Past Medical History:  Diagnosis Date   Anxiety    Depression    GERD (gastroesophageal reflux disease)    History of chicken pox    Hyperlipidemia    Migraine    complicated migraines with transient R side facial paralysis and aphasia   Migraine headache with aura    hemiplegic migraine   MVP (mitral valve prolapse)    Osteopenia    Reflex sympathetic dystrophy, unspecified 2011   R hand related to wrist fx, improved s/p nerve blocks   Seizures (HCC)    Stevens-Johnson disease (HCC)    Patient Active Problem List   Diagnosis Date Noted   Osteopenia 06/19/2020   Vitamin D deficiency 10/18/2018   Chronic cough 11/16/2017   Chronic fatigue 11/16/2017   Irregular heart rate 11/16/2017   Polyarthralgia 02/24/2017   Fibromyalgia 02/24/2017   RSD upper limb 09/02/2015   Hemiplegic migraine without status migrainosus, not intractable 12/05/2013   HYPERSOMNIA 04/04/2010   Hyperlipidemia 10/12/2006   Anxiety and depression 10/11/2006    Adjustment disorder with mixed anxiety and depressed mood 10/11/2006   GERD 10/11/2006   Disorder of bone and cartilage 10/11/2006   STEVENS-JOHNSON SYNDROME, HX OF 10/11/2006   Home Medication(s) Prior to Admission medications   Medication Sig Start Date End Date Taking? Authorizing Provider  Acetaminophen (TYLENOL 8 HOUR PO) Take by mouth daily.    [provider]  atenolol (TENORMIN) 25 MG tablet TAKE 1 TABLET BY MOUTH EVERY DAY 10/22/20   Hilts, Casimiro Needle, MD  citalopram (CELEXA) 40 MG tablet Take 1 tablet (40 mg total) by mouth daily. 06/02/21   Drema Dallas, DO  esomeprazole (NEXIUM) 20 MG capsule Take 1 capsule (20 mg total) by mouth daily before breakfast. OVC 09/22/12   Thermon Leyland, NP  folic acid (FOLVITE) 1 MG tablet Take 1 mg by mouth daily. 03/01/19   [provider]  hydroxychloroquine (PLAQUENIL) 200 MG tablet TAKE 2 TABLETS BY MOUTH EVERY DAY (M06.9) 10/10/18   [provider]  Multiple Vitamins-Minerals (VITAMIN D3 COMPLETE PO) Take by mouth daily.    [provider]  predniSONE (DELTASONE) 5 MG tablet Take 5 mg by mouth daily. 08/12/20   [provider]  Past Surgical History Past Surgical History:  Procedure Laterality Date   ABDOMINAL HYSTERECTOMY     BSO   CATARACT EXTRACTION     HEEL SPUR SURGERY  12/1996   right   ORIF DISTAL RADIUS FRACTURE  2004   Family History Family History  Problem Relation Age of Onset   Leukemia Brother        CLL   Cancer Brother    Arthritis Mother    Arthritis Father    Breast cancer Sister    Cancer Sister    Arthritis Other    Ovarian cancer Other    Hyperlipidemia Other    Hypertension Other    Stroke Other    Colon cancer Neg Hx     Social History Social History   Tobacco Use   Smoking status: Every Day    Packs/day: 1.00    Types: Cigarettes    Smokeless tobacco: Never   Tobacco comments:    Quit over 30 years ago( just needs it a bit now ) Stress   Substance Use Topics   Alcohol use: No   Drug use: No   Allergies Cephalexin, Epinephrine, Erythromycin, Lactated ringers, Nortriptyline, Penicillins, Sulfamethoxazole-trimethoprim, Sulfonamide derivatives, Trimethoprim, and Wellbutrin [bupropion]  Review of Systems Review of Systems  Constitutional:  Positive for fatigue.  Neurological:  Positive for tremors, weakness and headaches.   Physical Exam Vital Signs  I have reviewed the triage vital signs BP 139/83 (BP Location: Left Arm)   Pulse (!) 132   Temp 97.7 F (36.5 C) (Oral)   Resp (!) 22   Ht 5' 3.5" (1.613 m)   Wt 65.3 kg   SpO2 97%   BMI 25.11 kg/m   Physical Exam Vitals and nursing note reviewed.  Constitutional:      General: She is not in acute distress.    Appearance: She is well-developed.  HENT:     Head: Normocephalic and atraumatic.  Eyes:     Conjunctiva/sclera: Conjunctivae normal.  Cardiovascular:     Rate and Rhythm: Normal rate and regular rhythm.     Heart sounds: No murmur heard. Pulmonary:     Effort: Pulmonary effort is normal. No respiratory distress.     Breath sounds: Normal breath sounds.  Abdominal:     Palpations: Abdomen is soft.     Tenderness: There is no abdominal tenderness.  Musculoskeletal:        General: No swelling.     Cervical back: Neck supple.  Skin:    General: Skin is warm and dry.     Capillary Refill: Capillary refill takes less than 2 seconds.  Neurological:     Mental Status: She is alert and oriented to person, place, and time.     Cranial Nerves: No cranial nerve deficit or dysarthria.     Sensory: No sensory deficit.     Motor: No weakness.  Psychiatric:        Mood and Affect: Mood normal.    ED Results and Treatments Labs (all labs ordered are listed, but only abnormal results are displayed) Labs Reviewed  COMPREHENSIVE METABOLIC PANEL -  Abnormal; Notable for the following components:      Result Value   CO2 21 (*)    Glucose, Bld 118 (*)    All other components within normal limits  CBC - Abnormal; Notable for the following components:   WBC 11.5 (*)    Hemoglobin 15.1 (*)    All other components within normal limits  LIPASE, BLOOD  URINALYSIS, ROUTINE W REFLEX MICROSCOPIC                                                                                                                          Radiology DG Chest 2 View  Result Date: 08/02/2021 CLINICAL DATA:  Dyspnea EXAM: CHEST - 2 VIEW COMPARISON:  11/16/2017 FINDINGS: Cardiac size is within normal limits. Increase in AP diameter of chest suggests COPD. Lung fields are clear of any pulmonary edema or new focal infiltrates. There is no pleural effusion or pneumothorax. IMPRESSION: COPD. There are no signs of pulmonary edema or focal pulmonary consolidation. Electronically Signed   By: Ernie AvenaPalani  Rathinasamy M.D.   On: 08/02/2021 13:00    Pertinent labs & imaging results that were available during my care of the patient were reviewed by me and considered in my medical decision making (see MDM for details).  Medications Ordered in ED Medications - No data to display                                                                                                                                   Procedures Procedures  (including critical care time)  Medical Decision Making / ED Course   This patient presents to the ED for concern of headache, tremors, this involves an extensive number of treatment options, and is a complaint that carries with it a high risk of complications and morbidity.  The differential diagnosis includes migraine headache, hemiplegic migraine, low pressure headache, steroid side effect  MDM: Patient seen emergency room for evaluation of multiple complaints described above.  Physical exam is largely unremarkable outside of some mild tremoring.   Laboratory evaluation with a mild leukocytosis to 11.5, hemoglobin 15.1, CO2 21 but is otherwise unremarkable.  Patient's presentation appears consistent with her previous complicated migraine as the patient states that this is exactly how she felt previously.  She has no positional component of her headache lowering suspicion for low pressure headache.  Thus we trialed a headache cocktail with Compazine Ativan and lactated Ringer's and on reevaluation her symptoms have dramatically improved.  She was given strict return precautions and instructed to return the emergency department if her symptoms return for further consideration of possible low pressure headache.  However, given that this is exactly the presentation she has had previously with a complicated migraine and this improved with a headache cocktail, suspect  patient's symptoms will continue to improve at home.  Patient then discharged.   Additional history obtained: -Additional history obtained from husband -External records from outside source obtained and reviewed including: Chart review including previous notes, labs, imaging, consultation notes   Lab Tests: -I ordered, reviewed, and interpreted labs.   The pertinent results include:   Labs Reviewed  COMPREHENSIVE METABOLIC PANEL - Abnormal; Notable for the following components:      Result Value   CO2 21 (*)    Glucose, Bld 118 (*)    All other components within normal limits  CBC - Abnormal; Notable for the following components:   WBC 11.5 (*)    Hemoglobin 15.1 (*)    All other components within normal limits  LIPASE, BLOOD  URINALYSIS, ROUTINE W REFLEX MICROSCOPIC      EKG   EKG Interpretation  Date/Time:  Saturday Aug 02 2021 11:55:13 EDT Ventricular Rate:  73 PR Interval:  123 QRS Duration: 89 QT Interval:  418 QTC Calculation: 461 R Axis:   75 Text Interpretation: Sinus rhythm Atrial premature complexes Probable LVH with secondary repol abnrm Baseline wander in  lead(s) V1 Confirmed by Yisell Sprunger ([id number]) on 08/02/2021 2:35:08 PM         Imaging Studies ordered: I ordered imaging studies including chest x-ray I independently visualized and interpreted imaging. I agree with the radiologist interpretation   Medicines ordered and prescription drug management: No orders of the defined types were placed in this encounter.   -I have reviewed the patients home medicines and have made adjustments as needed  Critical interventions none    Cardiac Monitoring: The patient was maintained on a cardiac monitor.  I personally viewed and interpreted the cardiac monitored which showed an underlying rhythm of: NSR  Social Determinants of Health:  Factors impacting patients care include: None   Reevaluation: After the interventions noted above, I reevaluated the patient and found that they have :improved  Co morbidities that complicate the patient evaluation  Past Medical History:  Diagnosis Date   Anxiety    Depression    GERD (gastroesophageal reflux disease)    History of chicken pox    Hyperlipidemia    Migraine    complicated migraines with transient R side facial paralysis and aphasia   Migraine headache with aura    hemiplegic migraine   MVP (mitral valve prolapse)    Osteopenia    Reflex sympathetic dystrophy, unspecified 2011   R hand related to wrist fx, improved s/p nerve blocks   Seizures (HCC)    Stevens-Johnson disease (HCC)       Dispostion: I considered admission for this patient, but with fairly reassuring work-up and significant improvement with ER interventions patient safe for discharge with outpatient follow-up     Final Clinical Impression(s) / ED Diagnoses Final diagnoses:  None     @    Glendora Score, MD 08/02/21 2124

## 2021-08-02 NOTE — ED Provider Triage Note (Signed)
Emergency Medicine Provider Triage Evaluation Note  Lacey Salas , a 76 y.o. female  was evaluated in triage.  Pt complains of tremors, chills, fatigue since last week.  This was following epidural shot that she received for low back/hip pain.  Denies fever, chills, shortness of breath, chest pain.  Did report headache.  Has history of migraine.  Reports significant improvement in headache at this point.  Review of Systems  Positive: As above Negative: As above  Physical Exam  BP 139/83 (BP Location: Left Arm)   Pulse (!) 132   Temp 97.7 F (36.5 C) (Oral)   Resp (!) 22   Ht 5' 3.5" (1.613 m)   Wt 65.3 kg   SpO2 97%   BMI 25.11 kg/m  Gen:   Awake, no distress   Resp:  Normal effort MSK:   Moves extremities without difficulty  Other:    Medical Decision Making  Medically screening exam initiated at 12:09 PM.  Appropriate orders placed.  Lacey Salas was informed that the remainder of the evaluation will be completed by another provider, this initial triage assessment does not replace that evaluation, and the importance of remaining in the ED until their evaluation is complete.    Marita Kansasli, Takita Riecke, PA-C 08/02/21 1210

## 2021-08-02 NOTE — ED Notes (Signed)
Pt d/c home with visitor per MD order. Discharge summary reviewed, pt verbalizes understanding. Off unit via WC- No s/s of acute distress noted. Reports visitor is discharge ride home.

## 2021-08-02 NOTE — ED Notes (Signed)
Pt is aware of need for urine. I have advised the pt when she thinks she can pass urine to call the nurse and we would have someone walk with her to the restroom since she had ativan.

## 2021-08-25 DIAGNOSIS — M48062 Spinal stenosis, lumbar region with neurogenic claudication: Secondary | ICD-10-CM | POA: Diagnosis not present

## 2021-09-23 DIAGNOSIS — M48062 Spinal stenosis, lumbar region with neurogenic claudication: Secondary | ICD-10-CM | POA: Diagnosis not present

## 2021-10-15 NOTE — Progress Notes (Signed)
NEUROLOGY FOLLOW UP OFFICE NOTE  Lacey Salas [id number]  Assessment/Plan:   Hemiplegic migraine - stable Ocular migraine - stable Complex regional pain syndrome of right upper extremity Depression and anxiety Lumbar radiculopathy treated by ortho   Continue citalopram.  Likely needs to be changed due to ongoing depression.  I recommended that she establish care with a new PCP for making adjustment and future management.   Follow up one year     Subjective:  Lacey Salas is a 76 year old right-handed woman with RA, anxiety, depression, migraine, complex regional pain syndrome of right hand, hyperlipidemia, osteopenia, MVP and Stevens-Johnson syndrome who follows up for complicated migraines (hemiplegic migraine and ocular migraine).   UPDATE: Currently without a PCP.  She had a migraine in May following a cortisone injection in the back.  She was treated in the ED.  Otherwise, migraines have been well controlled.  She has lumbar spinal stenosis (L4-5).  At first, it was thought to be secondary to her hip.  If she tries to walk more than 15 feet, she is in pain.  She has left sided hip pain down the leg.  She had two epidurals which were ineffective. Failed physical therapy.  Told she is not a candidate for surgery.  Neuropathy in feet are painful as well.  Her main concern is her depression.      Current NSAIDS:  ASA 81mg  daily Current analgesics:  Tylenol Current triptans:  no Current anti-emetic:  no Current muscle relaxants:  no Current anti-anxiolytic:  none Current sleep aide:  no Current Antihypertensive medications:  atenolol 25mg  Current Antidepressant medications:  citalopram 40mg  Current Anticonvulsant medications:  no Current Vitamins/Herbal/Supplements:  no Current Antihistamines/Decongestants:  no Other therapy:  no Prednisone and Plaquenil for RA   Caffeine:  no Alcohol:  no Smoker:  Quit 34 years ago but started again due to recent tragedy Depression:   Significant depression and anxiety.   HISTORY: She has been evaluated by several neurologists and headache specialists, such as Dr. Avie Echevaria, Dr. Fonnie Jarvis, Dr. Rudene Christians, Dr. Wandra Arthurs, and Dr. Marcelino Freestone. Occurs spontaneously, including while driving. Onset:  Has history of typical migraines (with headache, nausea and vomiting) since age 59.  This subsided and started to have these current spells starting in her early 53s.  70% of the time it is accompanied by headache. Location:  holocephalic Quality:  Non-throbbing pressure Initial intensity:  5/10 Aura:  no Prodrome:  Indescribable "foreboding" feeling Associated symptoms:  Migraines typically present as TIA-like symptoms, including right-sided weakness involving face, arm and leg, photophobia, word-finding difficulties, difficulty getting words out, slurred speech, staggering gait.  She is conscious and alert but doesn't feel like she can function during the spell.  Following episodes, she is fatigued and will sleep for 2-3 hours.  On rare occasions, accompanied by visual field cut. Initial duration:  30 minutes Initial frequency:  Varies.  Usually once a month but may be 3 times a week. Triggers:  Chemical smells, some perfumes, bright lights Relieving factors:  Laying down Activity:  Cannot function   MRI/MRA of head from 06/15/08 were unremarkable. EEGs from 11/02/12 and 09/27/13 were normal.   Past abortive therapy:  none Past preventative therapy:  Amitriptyline (initially for complex regional pain syndrome), venlafaxine (racing thoughts), Sinequan, onriboflavin, Cymbalta, gabapentin   Family history of headache:  Maternal grandmother, sister   Neuropathy: She reports numbness, tingling and burning in the feet.  No weakness or radicular back pain.  Sed rate mildly elevated at 42.  ANA, CRP, SPEP/IFE, TSH and B12 (610) were unremarkable.  She is seeing rheumatology.   Visual Disturbance: In June 2020, she had  episode of sensation that eyes are crossing but for 10-15 seconds but denies actually seeing double vision or blurred vision, occurring once or twice a day and may go several days before it occurs again.  No associated headache.  Sometimes she thinks she sees something in her peripheral vision that isn't there.  One time, she thought she saw a fire on the burner which isn't there..  She also notes uncontrolled twitching of the legs or upper part of her body, lasting a few seconds.  She also has been feeling lightheaded.  This started prior to starting prednisone and Plaquenil.  MRI of brain without contrast was performed on 10/20/2018 demonstrated moderate chronic small vessel ischemic changes in the cerebral white matter and pons.  Due to endorsing myoclonus, MRI of cervical spine was also performed and showed noncompressive degenerative changes but normal cervical spinal cord. She had a formal eye exam which was unremarkable.  No longer having these symptoms.  Unclear if it may have been related to prednisone.  Memory deficits: She is concerned about her memory.  She reports word-finding issues, trouble remembering names.  May forget what she ate for breakfast that morning.  Able to pay bills without difficulty and no significant disorientation driving on familiar routes.  Thyroid level was normal.  PAST MEDICAL HISTORY: Past Medical History:  Diagnosis Date   Anxiety    Depression    GERD (gastroesophageal reflux disease)    History of chicken pox    Hyperlipidemia    Migraine    complicated migraines with transient R side facial paralysis and aphasia   Migraine headache with aura    hemiplegic migraine   MVP (mitral valve prolapse)    Osteopenia    Reflex sympathetic dystrophy, unspecified 2011   R hand related to wrist fx, improved s/p nerve blocks   Seizures ([facility]CC)    Stevens-Johnson disease ([facility])     MEDICATIONS: Current Outpatient Medications on File Prior to Visit  Medication Sig  Dispense Refill   Acetaminophen (TYLENOL 8 HOUR PO) Take by mouth daily.     atenolol (TENORMIN) 25 MG tablet TAKE 1 TABLET BY MOUTH EVERY DAY 90 tablet 2   citalopram (CELEXA) 40 MG tablet Take 1 tablet (40 mg total) by mouth daily. 90 tablet 1   esomeprazole (NEXIUM) 20 MG capsule Take 1 capsule (20 mg total) by mouth daily before breakfast. OVC     folic acid (FOLVITE) 1 MG tablet Take 1 mg by mouth daily.     hydroxychloroquine (PLAQUENIL) 200 MG tablet TAKE 2 TABLETS BY MOUTH EVERY DAY (M06.9)     Multiple Vitamins-Minerals (VITAMIN D3 COMPLETE PO) Take by mouth daily.     predniSONE (DELTASONE) 5 MG tablet Take 5 mg by mouth daily.     No current facility-administered medications on file prior to visit.    ALLERGIES: Allergies  Allergen Reactions   Cephalexin     REACTION: Levonne Spiller   Epinephrine     REACTION: hypertensive reaction   Erythromycin     REACTION: Rash   Lactated Ringers     REACTION: cardiac symptoms   Nortriptyline     Rapid heart rate   Penicillins     REACTION: Rash   Sulfamethoxazole-Trimethoprim     REACTION: Levonne Spiller   Sulfonamide Derivatives  Trimethoprim     REACTION: stevens johnson syndrome   Wellbutrin [Bupropion] Other (See Comments)    tremors    FAMILY HISTORY: Family History  Problem Relation Age of Onset   Leukemia Brother        CLL   Cancer Brother    Arthritis Mother    Arthritis Father    Breast cancer Sister    Cancer Sister    Arthritis Other    Ovarian cancer Other    Hyperlipidemia Other    Hypertension Other    Stroke Other    Colon cancer Neg Hx       Objective:  Blood pressure 132/71, pulse 81, height 5\' 3"  (1.6 m), weight 147 lb 6.4 oz (66.9 kg), SpO2 98 %. General: No acute distress.  Patient appears well-groomed.   Head:  Normocephalic/atraumatic Eyes:  Fundi examined but not visualized Neck: supple, no paraspinal tenderness, full range of motion Heart:  Regular rate and rhythm Lungs:   Clear to auscultation bilaterally Back: No paraspinal tenderness Neurological Exam: alert and oriented to person, place, and time.  Speech fluent and not dysarthric, language intact.  CN II-XII intact. Bulk and tone normal, muscle strength 5/5 throughout.  Sensation to light touch intact.  Deep tendon reflexes 2+ throughout.  Finger to nose testing intact.  Gait normal, Romberg negative.   , DO

## 2021-10-17 ENCOUNTER — Ambulatory Visit: Payer: Medicare Other | Admitting: Neurology

## 2021-10-17 ENCOUNTER — Encounter: Payer: Self-pay | Admitting: Neurology

## 2021-10-17 VITALS — BP 132/71 | HR 81 | Ht 63.0 in | Wt 147.4 lb

## 2021-10-17 DIAGNOSIS — M48062 Spinal stenosis, lumbar region with neurogenic claudication: Secondary | ICD-10-CM | POA: Diagnosis not present

## 2021-10-17 DIAGNOSIS — F32A Depression, unspecified: Secondary | ICD-10-CM

## 2021-10-17 DIAGNOSIS — G43109 Migraine with aura, not intractable, without status migrainosus: Secondary | ICD-10-CM

## 2021-10-17 DIAGNOSIS — F419 Anxiety disorder, unspecified: Secondary | ICD-10-CM

## 2021-10-17 DIAGNOSIS — G43409 Hemiplegic migraine, not intractable, without status migrainosus: Secondary | ICD-10-CM

## 2021-10-17 DIAGNOSIS — G90511 Complex regional pain syndrome I of right upper limb: Secondary | ICD-10-CM

## 2021-10-17 NOTE — Patient Instructions (Signed)
Continue citalopram but will need to establish care with a new PCP to make any changes Follow up one year

## 2021-10-20 DIAGNOSIS — M48062 Spinal stenosis, lumbar region with neurogenic claudication: Secondary | ICD-10-CM | POA: Diagnosis not present

## 2021-10-21 DIAGNOSIS — M48062 Spinal stenosis, lumbar region with neurogenic claudication: Secondary | ICD-10-CM | POA: Diagnosis not present

## 2021-10-21 DIAGNOSIS — M6281 Muscle weakness (generalized): Secondary | ICD-10-CM | POA: Diagnosis not present

## 2021-10-23 DIAGNOSIS — T783XXA Angioneurotic edema, initial encounter: Secondary | ICD-10-CM | POA: Diagnosis not present

## 2021-10-23 DIAGNOSIS — G9050 Complex regional pain syndrome I, unspecified: Secondary | ICD-10-CM | POA: Diagnosis not present

## 2021-10-23 DIAGNOSIS — M069 Rheumatoid arthritis, unspecified: Secondary | ICD-10-CM | POA: Diagnosis not present

## 2021-10-23 DIAGNOSIS — E782 Mixed hyperlipidemia: Secondary | ICD-10-CM | POA: Diagnosis not present

## 2021-10-23 DIAGNOSIS — L4050 Arthropathic psoriasis, unspecified: Secondary | ICD-10-CM | POA: Diagnosis not present

## 2021-10-27 DIAGNOSIS — T783XXD Angioneurotic edema, subsequent encounter: Secondary | ICD-10-CM | POA: Diagnosis not present

## 2021-10-28 DIAGNOSIS — M6281 Muscle weakness (generalized): Secondary | ICD-10-CM | POA: Diagnosis not present

## 2021-10-28 DIAGNOSIS — M48062 Spinal stenosis, lumbar region with neurogenic claudication: Secondary | ICD-10-CM | POA: Diagnosis not present

## 2021-10-29 DIAGNOSIS — H43813 Vitreous degeneration, bilateral: Secondary | ICD-10-CM | POA: Diagnosis not present

## 2021-10-29 DIAGNOSIS — H52203 Unspecified astigmatism, bilateral: Secondary | ICD-10-CM | POA: Diagnosis not present

## 2021-10-30 DIAGNOSIS — M48062 Spinal stenosis, lumbar region with neurogenic claudication: Secondary | ICD-10-CM | POA: Diagnosis not present

## 2021-10-30 DIAGNOSIS — M6281 Muscle weakness (generalized): Secondary | ICD-10-CM | POA: Diagnosis not present

## 2021-11-04 DIAGNOSIS — M48062 Spinal stenosis, lumbar region with neurogenic claudication: Secondary | ICD-10-CM | POA: Diagnosis not present

## 2021-11-04 DIAGNOSIS — M6281 Muscle weakness (generalized): Secondary | ICD-10-CM | POA: Diagnosis not present

## 2021-11-11 DIAGNOSIS — M6281 Muscle weakness (generalized): Secondary | ICD-10-CM | POA: Diagnosis not present

## 2021-11-11 DIAGNOSIS — M48062 Spinal stenosis, lumbar region with neurogenic claudication: Secondary | ICD-10-CM | POA: Diagnosis not present

## 2021-11-18 DIAGNOSIS — M48062 Spinal stenosis, lumbar region with neurogenic claudication: Secondary | ICD-10-CM | POA: Diagnosis not present

## 2021-11-18 DIAGNOSIS — M6281 Muscle weakness (generalized): Secondary | ICD-10-CM | POA: Diagnosis not present

## 2021-11-20 DIAGNOSIS — M48062 Spinal stenosis, lumbar region with neurogenic claudication: Secondary | ICD-10-CM | POA: Diagnosis not present

## 2021-11-20 DIAGNOSIS — M6281 Muscle weakness (generalized): Secondary | ICD-10-CM | POA: Diagnosis not present

## 2021-11-25 ENCOUNTER — Other Ambulatory Visit: Payer: Self-pay | Admitting: Neurology

## 2021-11-25 DIAGNOSIS — M6281 Muscle weakness (generalized): Secondary | ICD-10-CM | POA: Diagnosis not present

## 2021-11-25 DIAGNOSIS — M48062 Spinal stenosis, lumbar region with neurogenic claudication: Secondary | ICD-10-CM | POA: Diagnosis not present

## 2021-11-27 ENCOUNTER — Telehealth: Payer: Self-pay | Admitting: Neurology

## 2021-11-27 DIAGNOSIS — M48062 Spinal stenosis, lumbar region with neurogenic claudication: Secondary | ICD-10-CM | POA: Diagnosis not present

## 2021-11-27 DIAGNOSIS — M6281 Muscle weakness (generalized): Secondary | ICD-10-CM | POA: Diagnosis not present

## 2021-11-27 MED ORDER — CITALOPRAM HYDROBROMIDE 40 MG PO TABS: 40.0000 mg | ORAL_TABLET | Freq: Every day | ORAL | 1 refills | Status: DC

## 2021-11-27 NOTE — Telephone Encounter (Signed)
1. Which medications need refilled? (List name and dosage, if known) citalopram 40 mg  2. Which pharmacy/location is medication to be sent to? (include street and city if local pharmacy) [facility] [address]  3. Do they need a 30 day or 90 day supply? 90 day  Patient only has 3 pills left and was just seen.

## 2021-11-27 NOTE — Telephone Encounter (Signed)
Refill sent in for pt. 

## 2021-12-02 DIAGNOSIS — M48062 Spinal stenosis, lumbar region with neurogenic claudication: Secondary | ICD-10-CM | POA: Diagnosis not present

## 2021-12-02 DIAGNOSIS — M6281 Muscle weakness (generalized): Secondary | ICD-10-CM | POA: Diagnosis not present

## 2021-12-04 DIAGNOSIS — M48062 Spinal stenosis, lumbar region with neurogenic claudication: Secondary | ICD-10-CM | POA: Diagnosis not present

## 2021-12-04 DIAGNOSIS — E782 Mixed hyperlipidemia: Secondary | ICD-10-CM | POA: Diagnosis not present

## 2021-12-04 DIAGNOSIS — M6281 Muscle weakness (generalized): Secondary | ICD-10-CM | POA: Diagnosis not present

## 2021-12-09 DIAGNOSIS — M48062 Spinal stenosis, lumbar region with neurogenic claudication: Secondary | ICD-10-CM | POA: Diagnosis not present

## 2021-12-09 DIAGNOSIS — M6281 Muscle weakness (generalized): Secondary | ICD-10-CM | POA: Diagnosis not present

## 2021-12-11 DIAGNOSIS — E782 Mixed hyperlipidemia: Secondary | ICD-10-CM | POA: Diagnosis not present

## 2021-12-11 DIAGNOSIS — M069 Rheumatoid arthritis, unspecified: Secondary | ICD-10-CM | POA: Diagnosis not present

## 2021-12-11 DIAGNOSIS — G9050 Complex regional pain syndrome I, unspecified: Secondary | ICD-10-CM | POA: Diagnosis not present

## 2021-12-11 DIAGNOSIS — L4050 Arthropathic psoriasis, unspecified: Secondary | ICD-10-CM | POA: Diagnosis not present

## 2022-05-26 ENCOUNTER — Other Ambulatory Visit: Payer: Self-pay | Admitting: Neurology

## 2022-05-28 DIAGNOSIS — R5383 Other fatigue: Secondary | ICD-10-CM | POA: Diagnosis not present

## 2022-05-28 DIAGNOSIS — E782 Mixed hyperlipidemia: Secondary | ICD-10-CM | POA: Diagnosis not present

## 2022-05-28 DIAGNOSIS — Z0000 Encounter for general adult medical examination without abnormal findings: Secondary | ICD-10-CM | POA: Diagnosis not present

## 2022-06-04 DIAGNOSIS — L4050 Arthropathic psoriasis, unspecified: Secondary | ICD-10-CM | POA: Diagnosis not present

## 2022-06-04 DIAGNOSIS — M069 Rheumatoid arthritis, unspecified: Secondary | ICD-10-CM | POA: Diagnosis not present

## 2022-06-04 DIAGNOSIS — G9050 Complex regional pain syndrome I, unspecified: Secondary | ICD-10-CM | POA: Diagnosis not present

## 2022-06-04 DIAGNOSIS — K219 Gastro-esophageal reflux disease without esophagitis: Secondary | ICD-10-CM | POA: Diagnosis not present

## 2022-06-04 DIAGNOSIS — Z0000 Encounter for general adult medical examination without abnormal findings: Secondary | ICD-10-CM | POA: Diagnosis not present

## 2022-06-04 DIAGNOSIS — J309 Allergic rhinitis, unspecified: Secondary | ICD-10-CM | POA: Diagnosis not present

## 2022-06-04 DIAGNOSIS — E782 Mixed hyperlipidemia: Secondary | ICD-10-CM | POA: Diagnosis not present

## 2022-06-04 DIAGNOSIS — R0981 Nasal congestion: Secondary | ICD-10-CM | POA: Diagnosis not present

## 2022-06-04 DIAGNOSIS — L218 Other seborrheic dermatitis: Secondary | ICD-10-CM | POA: Diagnosis not present

## 2022-06-10 NOTE — Progress Notes (Unsigned)
NEUROLOGY FOLLOW UP OFFICE NOTE  Lacey Salas [id number]  Assessment/Plan:   Hemiplegic migraine - stable Ocular migraine - stable Complex regional pain syndrome of right upper extremity Depression and anxiety Lumbar radiculopathy treated by ortho   Continue citalopram.  Likely needs to be changed due to ongoing depression.  I recommended that she establish care with a new PCP for making adjustment and future management.   Follow up one year     Subjective:  Lacey Salas is a 77 year old right-handed woman with RA, anxiety, depression, migraine, complex regional pain syndrome of right hand, hyperlipidemia, osteopenia, MVP and Stevens-Johnson syndrome who follows up for complicated migraines (hemiplegic migraine and ocular migraine) as well as now tremor.     UPDATE: Tremor: ***    Current NSAIDS:  ASA 81mg  daily Current analgesics:  Tylenol Current triptans:  no Current anti-emetic:  no Current muscle relaxants:  no Current anti-anxiolytic:  none Current sleep aide:  no Current Antihypertensive medications:  atenolol 25mg  Current Antidepressant medications:  citalopram 40mg  Current Anticonvulsant medications:  no Current Vitamins/Herbal/Supplements:  no Current Antihistamines/Decongestants:  no Other therapy:  no Prednisone and Plaquenil for RA   Caffeine:  no Alcohol:  no Smoker:  Quit 34 years ago but started again due to recent tragedy Depression:  Significant depression and anxiety.   HISTORY: Migraines: She has been evaluated by several neurologists and headache specialists, such as Dr. Morene Antu, Dr. Coralie Common, Dr. Einar Crow, Dr. Drenda Freeze, and Dr. Marney Setting. Occurs spontaneously, including while driving. Onset:  Has history of typical migraines (with headache, nausea and vomiting) since age 38.  This subsided and started to have these current spells starting in her early 34s.  70% of the time it is accompanied by headache. Location:   holocephalic Quality:  Non-throbbing pressure Initial intensity:  5/10 Aura:  no Prodrome:  Indescribable "foreboding" feeling Associated symptoms:  Migraines typically present as TIA-like symptoms, including right-sided weakness involving face, arm and leg, photophobia, word-finding difficulties, difficulty getting words out, slurred speech, staggering gait.  She is conscious and alert but doesn't feel like she can function during the spell.  Following episodes, she is fatigued and will sleep for 2-3 hours.  On rare occasions, accompanied by visual field cut. Initial duration:  30 minutes Initial frequency:  Varies.  Usually once a month but may be 3 times a week. Triggers:  Chemical smells, some perfumes, bright lights Relieving factors:  Laying down Activity:  Cannot function   MRI/MRA of head from 06/15/08 were unremarkable. EEGs from 11/02/12 and 09/27/13 were normal.   Past abortive therapy:  none Past preventative therapy:  Amitriptyline (initially for complex regional pain syndrome), venlafaxine (racing thoughts), Sinequan, onriboflavin, Cymbalta, gabapentin   Family history of headache:  Maternal grandmother, sister   Neuropathy: She reports numbness, tingling and burning in the feet.  No weakness or radicular back pain.  Sed rate mildly elevated at 42.  ANA, CRP, SPEP/IFE, TSH and B12 (610) were unremarkable.  She has rheumatoid arthritis.  She has lumbar spinal stenosis (L4-5).  At first, it was thought to be secondary to her hip.  If she tries to walk more than 15 feet, she is in pain.  She has left sided hip pain down the leg.  She had two epidurals which were ineffective. Failed physical therapy.  Told she is not a candidate for surgery.     Visual Disturbance: In June 2020, she had episode of sensation that eyes are crossing  but for 10-15 seconds but denies actually seeing double vision or blurred vision, occurring once or twice a day and may go several days before it occurs again.   No associated headache.  Sometimes she thinks she sees something in her peripheral vision that isn't there.  One time, she thought she saw a fire on the burner which isn't there..  She also notes uncontrolled twitching of the legs or upper part of her body, lasting a few seconds.  She also has been feeling lightheaded.  This started prior to starting prednisone and Plaquenil.  MRI of brain without contrast was performed on 10/20/2018 demonstrated moderate chronic small vessel ischemic changes in the cerebral white matter and pons.  Due to endorsing myoclonus, MRI of cervical spine was also performed and showed noncompressive degenerative changes but normal cervical spinal cord. She had a formal eye exam which was unremarkable.  No longer having these symptoms.  Unclear if it may have been related to prednisone.  Memory deficits: She is concerned about her memory.  She reports word-finding issues, trouble remembering names.  May forget what she ate for breakfast that morning.  Able to pay bills without difficulty and no significant disorientation driving on familiar routes.  Thyroid level was normal.  PAST MEDICAL HISTORY: Past Medical History:  Diagnosis Date   Anxiety    Depression    GERD (gastroesophageal reflux disease)    History of chicken pox    Hyperlipidemia    Migraine    complicated migraines with transient R side facial paralysis and aphasia   Migraine headache with aura    hemiplegic migraine   MVP (mitral valve prolapse)    Osteopenia    Reflex sympathetic dystrophy, unspecified 2011   R hand related to wrist fx, improved s/p nerve blocks   Seizures ([facility])    Stevens-Johnson disease ([place])     MEDICATIONS: Current Outpatient Medications on File Prior to Visit  Medication Sig Dispense Refill   Acetaminophen (TYLENOL 8 HOUR PO) Take by mouth daily.     atenolol (TENORMIN) 25 MG tablet TAKE 1 TABLET BY MOUTH EVERY DAY 90 tablet 2   citalopram (CELEXA) 40 MG tablet TAKE 1 TABLET  BY MOUTH EVERY DAY 90 tablet 0   cyanocobalamin (VITAMIN B12) 500 MCG tablet Take 500 mcg by mouth daily.     esomeprazole (NEXIUM) 20 MG capsule Take 1 capsule (20 mg total) by mouth daily before breakfast. OVC     Multiple Vitamins-Minerals (VITAMIN D3 COMPLETE PO) Take by mouth daily.     Turmeric (QC TUMERIC COMPLEX PO) Take by mouth.     No current facility-administered medications on file prior to visit.    ALLERGIES: Allergies  Allergen Reactions   Cephalexin     REACTION: Kathreen Cosier   Epinephrine     REACTION: hypertensive reaction   Erythromycin     REACTION: Rash   Lactated Ringers     REACTION: cardiac symptoms   Nortriptyline     Rapid heart rate   Penicillins     REACTION: Rash   Sulfamethoxazole-Trimethoprim     REACTION: Kathreen Cosier   Sulfonamide Derivatives    Trimethoprim     REACTION: stevens johnson syndrome   Wellbutrin [Bupropion] Other (See Comments)    tremors    FAMILY HISTORY: Family History  Problem Relation Age of Onset   Leukemia Brother        CLL   Cancer Brother    Arthritis Mother    Arthritis  Father    Breast cancer Sister    Cancer Sister    Arthritis Other    Ovarian cancer Other    Hyperlipidemia Other    Hypertension Other    Stroke Other    Colon cancer Neg Hx       Objective:  *** General: No acute distress.  Patient appears well-groomed.   Head:  Normocephalic/atraumatic Eyes:  Fundi examined but not visualized Neck: supple, no paraspinal tenderness, full range of motion Heart:  Regular rate and rhythm Neurological Exam: alert and oriented to person, place, and time.  Speech fluent and not dysarthric, language intact.  CN II-XII intact. Bulk and tone normal, muscle strength 5/5 throughout.  Sensation to light touch intact.  Deep tendon reflexes 2+ throughout.  Finger to nose testing intact.  Gait normal, Romberg negative.   Metta Clines, DO

## 2022-06-11 ENCOUNTER — Encounter: Payer: Self-pay | Admitting: Neurology

## 2022-06-11 ENCOUNTER — Ambulatory Visit: Payer: Medicare Other | Admitting: Neurology

## 2022-06-11 VITALS — BP 126/77 | HR 78 | Ht 63.0 in | Wt 147.0 lb

## 2022-06-11 DIAGNOSIS — G250 Essential tremor: Secondary | ICD-10-CM | POA: Diagnosis not present

## 2022-06-11 DIAGNOSIS — G6289 Other specified polyneuropathies: Secondary | ICD-10-CM

## 2022-06-11 DIAGNOSIS — M48062 Spinal stenosis, lumbar region with neurogenic claudication: Secondary | ICD-10-CM

## 2022-07-08 DIAGNOSIS — L853 Xerosis cutis: Secondary | ICD-10-CM | POA: Diagnosis not present

## 2022-07-08 DIAGNOSIS — L680 Hirsutism: Secondary | ICD-10-CM | POA: Diagnosis not present

## 2022-08-24 ENCOUNTER — Other Ambulatory Visit: Payer: Self-pay | Admitting: Neurology

## 2022-09-30 DIAGNOSIS — M48061 Spinal stenosis, lumbar region without neurogenic claudication: Secondary | ICD-10-CM | POA: Diagnosis not present

## 2022-10-19 DIAGNOSIS — M48062 Spinal stenosis, lumbar region with neurogenic claudication: Secondary | ICD-10-CM | POA: Diagnosis not present

## 2022-10-26 ENCOUNTER — Ambulatory Visit: Payer: Medicare Other | Admitting: Neurology

## 2022-10-26 DIAGNOSIS — M48062 Spinal stenosis, lumbar region with neurogenic claudication: Secondary | ICD-10-CM | POA: Diagnosis not present

## 2022-10-26 DIAGNOSIS — M6281 Muscle weakness (generalized): Secondary | ICD-10-CM | POA: Diagnosis not present

## 2022-11-03 DIAGNOSIS — H52203 Unspecified astigmatism, bilateral: Secondary | ICD-10-CM | POA: Diagnosis not present

## 2022-11-03 DIAGNOSIS — H43813 Vitreous degeneration, bilateral: Secondary | ICD-10-CM | POA: Diagnosis not present

## 2022-11-09 DIAGNOSIS — M797 Fibromyalgia: Secondary | ICD-10-CM | POA: Diagnosis not present

## 2022-11-09 DIAGNOSIS — M2559 Pain in other specified joint: Secondary | ICD-10-CM | POA: Diagnosis not present

## 2022-11-09 DIAGNOSIS — M1991 Primary osteoarthritis, unspecified site: Secondary | ICD-10-CM | POA: Diagnosis not present

## 2022-11-09 DIAGNOSIS — M7989 Other specified soft tissue disorders: Secondary | ICD-10-CM | POA: Diagnosis not present

## 2022-11-09 DIAGNOSIS — M5136 Other intervertebral disc degeneration, lumbar region: Secondary | ICD-10-CM | POA: Diagnosis not present

## 2022-11-09 DIAGNOSIS — L409 Psoriasis, unspecified: Secondary | ICD-10-CM | POA: Diagnosis not present

## 2022-11-09 DIAGNOSIS — R5383 Other fatigue: Secondary | ICD-10-CM | POA: Diagnosis not present

## 2022-11-10 DIAGNOSIS — M48062 Spinal stenosis, lumbar region with neurogenic claudication: Secondary | ICD-10-CM | POA: Diagnosis not present

## 2022-11-10 DIAGNOSIS — M6281 Muscle weakness (generalized): Secondary | ICD-10-CM | POA: Diagnosis not present

## 2022-11-18 DIAGNOSIS — M25552 Pain in left hip: Secondary | ICD-10-CM | POA: Diagnosis not present

## 2022-11-20 ENCOUNTER — Other Ambulatory Visit: Payer: Self-pay | Admitting: Neurology

## 2022-11-23 DIAGNOSIS — M5136 Other intervertebral disc degeneration, lumbar region: Secondary | ICD-10-CM | POA: Diagnosis not present

## 2022-11-23 DIAGNOSIS — M797 Fibromyalgia: Secondary | ICD-10-CM | POA: Diagnosis not present

## 2022-11-23 DIAGNOSIS — R768 Other specified abnormal immunological findings in serum: Secondary | ICD-10-CM | POA: Diagnosis not present

## 2022-11-23 DIAGNOSIS — M1991 Primary osteoarthritis, unspecified site: Secondary | ICD-10-CM | POA: Diagnosis not present

## 2022-11-23 DIAGNOSIS — L409 Psoriasis, unspecified: Secondary | ICD-10-CM | POA: Diagnosis not present

## 2022-11-30 DIAGNOSIS — M25552 Pain in left hip: Secondary | ICD-10-CM | POA: Diagnosis not present

## 2022-11-30 DIAGNOSIS — M48061 Spinal stenosis, lumbar region without neurogenic claudication: Secondary | ICD-10-CM | POA: Diagnosis not present

## 2022-11-30 DIAGNOSIS — I499 Cardiac arrhythmia, unspecified: Secondary | ICD-10-CM | POA: Diagnosis not present

## 2022-11-30 DIAGNOSIS — R61 Generalized hyperhidrosis: Secondary | ICD-10-CM | POA: Diagnosis not present

## 2022-11-30 DIAGNOSIS — G43109 Migraine with aura, not intractable, without status migrainosus: Secondary | ICD-10-CM | POA: Diagnosis not present

## 2022-11-30 DIAGNOSIS — K219 Gastro-esophageal reflux disease without esophagitis: Secondary | ICD-10-CM | POA: Diagnosis not present

## 2022-12-03 DIAGNOSIS — M6281 Muscle weakness (generalized): Secondary | ICD-10-CM | POA: Diagnosis not present

## 2022-12-03 DIAGNOSIS — M7062 Trochanteric bursitis, left hip: Secondary | ICD-10-CM | POA: Diagnosis not present

## 2022-12-03 DIAGNOSIS — M48062 Spinal stenosis, lumbar region with neurogenic claudication: Secondary | ICD-10-CM | POA: Diagnosis not present

## 2022-12-04 NOTE — Progress Notes (Signed)
NEUROLOGY FOLLOW UP OFFICE NOTE  Lacey Salas [id number]  Assessment/Plan:   Essential tremor Memory deficits Gait instability secondary to polyneuropathy and lumbar spinal stenosis Hemiplegic migraine - stable Ocular migraine - stable Complex regional pain syndrome of right upper extremity Depression and anxiety    For memory workup: Check TSH and B12 Check MRI of brain without contrast Neuropsychological evaluation Monitor tremor for now.  If needed, would start primidone as she is already on a beta blocker  Continue citalopram.   Follow up 6 months.      Subjective:  Lacey Salas is a 77 year old right-handed woman with RA, anxiety, depression, migraine, complex regional pain syndrome of right hand, hyperlipidemia, osteopenia, MVP and Stevens-Johnson syndrome who follows up for complicated migraines (hemiplegic migraine and ocular migraine) as well as now tremor.     UPDATE: She is dealing with pain related to lumbar stenosis and left hip.  Has problems with her dentures causing discomfort.  She overall hurts.  She notes some memory problems such as forgetting some recipes.  She sometimes has trouble remembering how to drive to familiar places but will get it after a couple of minutes.  Still independent.  No family history of Alzheimer's but she said her mother had cognitive difficulties after taking prednisone.    Tremor: Stable.  Does cause difficulty with writing but does not feel it warrants starting a medication.  Migraines: She seldom has a migraine.  She may have a more dull non-pressure headache at times.       Current NSAIDS:  ASA 81mg  daily Current analgesics:  Tylenol Current triptans:  no Current anti-emetic:  no Current muscle relaxants:  no Current anti-anxiolytic:  none Current sleep aide:  no Current Antihypertensive medications:  atenolol 25mg  Current Antidepressant medications:  citalopram 40mg  Current Anticonvulsant medications:  no Current  Vitamins/Herbal/Supplements:  no Current Antihistamines/Decongestants:  no Other therapy:  no Prednisone and Plaquenil for RA   Caffeine:  no Alcohol:  no Smoker:  Quit 34 years ago but started again due to recent tragedy Depression:  Significant depression and anxiety.   HISTORY: Migraines: She has been evaluated by several neurologists and headache specialists, such as Dr. Avie Echevaria, Dr. Fonnie Jarvis, Dr. Rudene Christians, Dr. Wandra Arthurs, and Dr. Marcelino Freestone. Occurs spontaneously, including while driving. Onset:  Has history of typical migraines (with headache, nausea and vomiting) since age 31.  This subsided and started to have these current spells starting in her early 79s.  70% of the time it is accompanied by headache. Location:  holocephalic Quality:  Non-throbbing pressure Initial intensity:  5/10 Aura:  no Prodrome:  Indescribable "foreboding" feeling Associated symptoms:  Migraines typically present as TIA-like symptoms, including right-sided weakness involving face, arm and leg, photophobia, word-finding difficulties, difficulty getting words out, slurred speech, staggering gait.  She is conscious and alert but doesn't feel like she can function during the spell.  Following episodes, she is fatigued and will sleep for 2-3 hours.  On rare occasions, accompanied by visual field cut. Initial duration:  30 minutes Initial frequency:  Varies.  Usually once a month but may be 3 times a week. Triggers:  Chemical smells, some perfumes, bright lights Relieving factors:  Laying down Activity:  Cannot function   MRI/MRA of head from 06/15/08 were unremarkable. EEGs from 11/02/12 and 09/27/13 were normal.   Past abortive therapy:  none Past preventative therapy:  Amitriptyline (initially for complex regional pain syndrome), venlafaxine (racing thoughts), Sinequan, onriboflavin, Cymbalta, gabapentin  Family history of headache:  Maternal grandmother, sister   Neuropathy: She  reports numbness, tingling and burning in the feet.  No weakness or radicular back pain.  Sed rate mildly elevated at 42.  ANA, CRP, SPEP/IFE, TSH and B12 (610) were unremarkable.  She has rheumatoid arthritis.  She has lumbar spinal stenosis (L4-5).  At first, it was thought to be secondary to her hip.  If she tries to walk more than 15 feet, she is in pain.  She has left sided hip pain down the leg.  She had two epidurals which were ineffective. Failed physical therapy.  Told she is not a candidate for surgery.    Tremor: Started noticing tremors in 2023.  She notices it with use, particularly when holding objects such as writing, holding utensils.  It shakes when she is holding the bible up at church.  Not at rest.  Notices it slightly in the left hand.  She has complex regional pain syndrome in the right hand.  It has progressively gotten worse.  Balance is poor.  She has pain an discoloration in the toes.  She has spinal stenosis in the lumbar spine.  No family history of tremor.     Visual Disturbance: In June 2020, she had episode of sensation that eyes are crossing but for 10-15 seconds but denies actually seeing double vision or blurred vision, occurring once or twice a day and may go several days before it occurs again.  No associated headache.  Sometimes she thinks she sees something in her peripheral vision that isn't there.  One time, she thought she saw a fire on the burner which isn't there..  She also notes uncontrolled twitching of the legs or upper part of her body, lasting a few seconds.  She also has been feeling lightheaded.  This started prior to starting prednisone and Plaquenil.  MRI of brain without contrast was performed on 10/20/2018 demonstrated moderate chronic small vessel ischemic changes in the cerebral white matter and pons.  Due to endorsing myoclonus, MRI of cervical spine was also performed and showed noncompressive degenerative changes but normal cervical spinal cord. She had  a formal eye exam which was unremarkable.  No longer having these symptoms.  Unclear if it may have been related to prednisone.  Memory deficits: She is concerned about her memory.  She reports word-finding issues, trouble remembering names.  May forget what she ate for breakfast that morning.  Able to pay bills without difficulty and no significant disorientation driving on familiar routes.  Thyroid level was normal.  PAST MEDICAL HISTORY: Past Medical History:  Diagnosis Date   Anxiety    Depression    GERD (gastroesophageal reflux disease)    History of chicken pox    Hyperlipidemia    Migraine    complicated migraines with transient R side facial paralysis and aphasia   Migraine headache with aura    hemiplegic migraine   MVP (mitral valve prolapse)    Osteopenia    Reflex sympathetic dystrophy, unspecified 2011   R hand related to wrist fx, improved s/p nerve blocks   Seizures (HCC)    Stevens-Johnson disease (HCC)     MEDICATIONS: Current Outpatient Medications on File Prior to Visit  Medication Sig Dispense Refill   Acetaminophen (TYLENOL 8 HOUR PO) Take by mouth daily.     atenolol (TENORMIN) 25 MG tablet TAKE 1 TABLET BY MOUTH EVERY DAY 90 tablet 2   citalopram (CELEXA) 40 MG tablet TAKE 1 TABLET  BY MOUTH EVERY DAY 90 tablet 0   esomeprazole (NEXIUM) 20 MG capsule Take 1 capsule (20 mg total) by mouth daily before breakfast. OVC     Multiple Vitamins-Minerals (VITAMIN D3 COMPLETE PO) Take by mouth daily.     Turmeric (QC TUMERIC COMPLEX PO) Take by mouth.     No current facility-administered medications on file prior to visit.    ALLERGIES: Allergies  Allergen Reactions   Cephalexin     REACTION: Levonne Spiller   Epinephrine     REACTION: hypertensive reaction   Erythromycin     REACTION: Rash   Lactated Ringers     REACTION: cardiac symptoms   Nortriptyline     Rapid heart rate   Penicillins     REACTION: Rash   Sulfamethoxazole-Trimethoprim      REACTION: Levonne Spiller   Sulfonamide Derivatives    Trimethoprim     REACTION: stevens johnson syndrome   Wellbutrin [Bupropion] Other (See Comments)    tremors    FAMILY HISTORY: Family History  Problem Relation Age of Onset   Leukemia Brother        CLL   Cancer Brother    Arthritis Mother    Arthritis Father    Breast cancer Sister    Cancer Sister    Arthritis Other    Ovarian cancer Other    Hyperlipidemia Other    Hypertension Other    Stroke Other    Colon cancer Neg Hx       Objective:  Blood pressure 120/60, pulse 81, height 5\' 3"  (1.6 m), weight 143 lb 6.4 oz (65 kg), SpO2 96%. General: No acute distress.  Patient appears well-groomed.   Head:  Normocephalic/atraumatic Eyes:  Fundi examined but not visualized Neck: supple, no paraspinal tenderness, full range of motion Heart:  Regular rate and rhythm Neurological Exam: alert and oriented.  Speech fluent and not dysarthric.  Language intact.      12/14/2022    2:00 PM 03/06/2014    2:28 PM  Montreal Cognitive Assessment   Visuospatial/ Executive (0/5) 4 4  Naming (0/3) 3 3  Attention: Read list of digits (0/2) 2 2  Attention: Read list of letters (0/1) 1 1  Attention: Serial 7 subtraction starting at 100 (0/3) 2 2  Language: Repeat phrase (0/2) 1 2  Language : Fluency (0/1) 1 1  Abstraction (0/2) 1 2  Delayed Recall (0/5) 2 2  Orientation (0/6) 6 6  Total 23 25  Adjusted Score (based on education) 23 25    Postural and kinetic tremor in hands.  Shon Millet, DO   CC:  Jasper Loser, MD

## 2022-12-14 ENCOUNTER — Ambulatory Visit: Payer: Medicare Other | Admitting: Neurology

## 2022-12-14 ENCOUNTER — Other Ambulatory Visit (INDEPENDENT_AMBULATORY_CARE_PROVIDER_SITE_OTHER): Payer: Medicare Other

## 2022-12-14 ENCOUNTER — Encounter: Payer: Self-pay | Admitting: Neurology

## 2022-12-14 VITALS — BP 120/60 | HR 81 | Ht 63.0 in | Wt 143.4 lb

## 2022-12-14 DIAGNOSIS — G43109 Migraine with aura, not intractable, without status migrainosus: Secondary | ICD-10-CM | POA: Diagnosis not present

## 2022-12-14 DIAGNOSIS — G43409 Hemiplegic migraine, not intractable, without status migrainosus: Secondary | ICD-10-CM | POA: Diagnosis not present

## 2022-12-14 DIAGNOSIS — G250 Essential tremor: Secondary | ICD-10-CM

## 2022-12-14 DIAGNOSIS — R413 Other amnesia: Secondary | ICD-10-CM

## 2022-12-14 NOTE — Patient Instructions (Addendum)
Continue citalopram Monitor tremor Check B12 and TSH Check MRI of brain without contrast Refer to neuropsychological testing Follow up 6 months.

## 2022-12-15 ENCOUNTER — Telehealth: Payer: Self-pay | Admitting: Neurology

## 2022-12-15 LAB — TSH: TSH: 1.79 u[IU]/mL (ref 0.35–5.50)

## 2022-12-15 LAB — VITAMIN B12: Vitamin B-12: 276 pg/mL (ref 211–911)

## 2022-12-15 NOTE — Telephone Encounter (Signed)
Called patient and gave b-12 results and instructed her to start 1000 mcg

## 2022-12-15 NOTE — Telephone Encounter (Signed)
Pt called in and left a message. She is returning a call about her lab results

## 2022-12-15 NOTE — Progress Notes (Signed)
LMOVM for patient to call the office back.

## 2022-12-28 DIAGNOSIS — M1612 Unilateral primary osteoarthritis, left hip: Secondary | ICD-10-CM | POA: Diagnosis not present

## 2022-12-30 DIAGNOSIS — M48061 Spinal stenosis, lumbar region without neurogenic claudication: Secondary | ICD-10-CM | POA: Diagnosis not present

## 2022-12-30 DIAGNOSIS — K219 Gastro-esophageal reflux disease without esophagitis: Secondary | ICD-10-CM | POA: Diagnosis not present

## 2022-12-30 DIAGNOSIS — I499 Cardiac arrhythmia, unspecified: Secondary | ICD-10-CM | POA: Diagnosis not present

## 2023-01-03 ENCOUNTER — Ambulatory Visit
Admission: RE | Admit: 2023-01-03 | Discharge: 2023-01-03 | Disposition: A | Discharge status: Home / Self Care | Payer: Medicare Other | Source: Ambulatory Visit | Attending: Neurology | Admitting: Neurology

## 2023-01-03 DIAGNOSIS — R413 Other amnesia: Secondary | ICD-10-CM | POA: Diagnosis not present

## 2023-01-03 DIAGNOSIS — G939 Disorder of brain, unspecified: Secondary | ICD-10-CM | POA: Diagnosis not present

## 2023-01-03 DIAGNOSIS — I6782 Cerebral ischemia: Secondary | ICD-10-CM | POA: Diagnosis not present

## 2023-01-05 ENCOUNTER — Telehealth: Payer: Self-pay

## 2023-01-05 ENCOUNTER — Telehealth: Payer: Self-pay | Admitting: Neurology

## 2023-01-05 DIAGNOSIS — D329 Benign neoplasm of meninges, unspecified: Secondary | ICD-10-CM

## 2023-01-05 NOTE — Telephone Encounter (Signed)
Pt called in and left a message. She is returning a call about results

## 2023-01-05 NOTE — Progress Notes (Signed)
LMOVM for patient to call the office back.

## 2023-01-05 NOTE — Telephone Encounter (Signed)
-----   Message from Cira Servant sent at 01/05/2023 12:06 PM EDT ----- MRI does not reveal any concerning cause for memory problems.  There is an incidental finding - there is a small mass in the back of the brain, likely a meningioma which is benign.  However, for better visualization, would like to get MRI of brain WITH contrast.

## 2023-01-05 NOTE — Telephone Encounter (Signed)
Patient advised of Results. Patient agreed to the MRI with Contrast.

## 2023-01-05 NOTE — Telephone Encounter (Signed)
[facility] called in with a call report  1. 15 mm extra-axial mass along the inferolateral aspect of the right cerebellar hemisphere. This finding likely reflects a meningioma, but is incompletely assessed on this non-contrast examination. Post-contrast MR imaging of the brain is recommended for further evaluation. The mass mildly indents the right cerebellar hemisphere without underlying parenchymal edema. 2. Moderate chronic small vessel ischemic changes within the cerebral white matter and pons, similar to the prior brain MRI of 11/19/2018. 3. Otherwise unremarkable MRI appearance of the brain. No age advanced or lobar predominant parenchymal atrophy.

## 2023-01-13 DIAGNOSIS — M1612 Unilateral primary osteoarthritis, left hip: Secondary | ICD-10-CM | POA: Diagnosis not present

## 2023-01-19 ENCOUNTER — Telehealth: Payer: Self-pay | Admitting: Neurology

## 2023-01-19 ENCOUNTER — Telehealth: Payer: Self-pay

## 2023-01-19 NOTE — Telephone Encounter (Signed)
Surgical clarence received from [facility].

## 2023-01-19 NOTE — Telephone Encounter (Signed)
Received Preoperative Risk Assessment from [facility] via fax. Form is in Dr. Moises Blood box

## 2023-01-27 DIAGNOSIS — F17200 Nicotine dependence, unspecified, uncomplicated: Secondary | ICD-10-CM | POA: Diagnosis not present

## 2023-01-27 DIAGNOSIS — Z01818 Encounter for other preprocedural examination: Secondary | ICD-10-CM | POA: Diagnosis not present

## 2023-01-27 DIAGNOSIS — M25552 Pain in left hip: Secondary | ICD-10-CM | POA: Diagnosis not present

## 2023-01-27 DIAGNOSIS — K219 Gastro-esophageal reflux disease without esophagitis: Secondary | ICD-10-CM | POA: Diagnosis not present

## 2023-02-08 DIAGNOSIS — R918 Other nonspecific abnormal finding of lung field: Secondary | ICD-10-CM | POA: Diagnosis not present

## 2023-02-08 DIAGNOSIS — J984 Other disorders of lung: Secondary | ICD-10-CM | POA: Diagnosis not present

## 2023-02-08 DIAGNOSIS — Z01818 Encounter for other preprocedural examination: Secondary | ICD-10-CM | POA: Diagnosis not present

## 2023-02-15 DIAGNOSIS — J948 Other specified pleural conditions: Secondary | ICD-10-CM | POA: Diagnosis not present

## 2023-02-20 ENCOUNTER — Ambulatory Visit
Admission: RE | Admit: 2023-02-20 | Discharge: 2023-02-20 | Disposition: A | Discharge status: Home / Self Care | Payer: Medicare Other | Source: Ambulatory Visit | Attending: Neurology | Admitting: Neurology

## 2023-02-20 DIAGNOSIS — D329 Benign neoplasm of meninges, unspecified: Secondary | ICD-10-CM

## 2023-02-20 DIAGNOSIS — D320 Benign neoplasm of cerebral meninges: Secondary | ICD-10-CM | POA: Diagnosis not present

## 2023-02-20 DIAGNOSIS — I6782 Cerebral ischemia: Secondary | ICD-10-CM | POA: Diagnosis not present

## 2023-02-20 DIAGNOSIS — R410 Disorientation, unspecified: Secondary | ICD-10-CM | POA: Diagnosis not present

## 2023-02-20 MED ORDER — GADOPICLENOL 0.5 MMOL/ML IV SOLN
6.5000 mL | Freq: Once | INTRAVENOUS | Status: AC | PRN
  Administered 2023-02-20: 6.5 mL via INTRAVENOUS

## 2023-02-24 ENCOUNTER — Other Ambulatory Visit: Payer: Self-pay | Admitting: Neurology

## 2023-03-01 ENCOUNTER — Telehealth: Payer: Self-pay | Admitting: Neurology

## 2023-03-01 DIAGNOSIS — D329 Benign neoplasm of meninges, unspecified: Secondary | ICD-10-CM

## 2023-03-01 NOTE — Telephone Encounter (Signed)
Pt left a message on the Voicemail wanting to see if we got the results back from the MRI she had done please call

## 2023-03-01 NOTE — Telephone Encounter (Signed)
LMOVM for patient imaging not read yet.

## 2023-03-03 NOTE — Telephone Encounter (Signed)
Reading room will have MRI read.

## 2023-03-11 NOTE — Telephone Encounter (Signed)
Pt returning call for [facility]

## 2023-03-11 NOTE — Addendum Note (Signed)
Addended by: Leida Lauth on: 03/11/2023 01:05 PM   Modules accepted: Orders

## 2023-03-11 NOTE — Progress Notes (Signed)
Tried calling patient no answer. LMOVM for patient to call the office back.  

## 2023-03-11 NOTE — Telephone Encounter (Signed)
Patient advised of her MRI.   Patient wanted to make sure the Meningioma wasn't the cause of her Dizziness, gait instability

## 2023-03-12 NOTE — Telephone Encounter (Signed)
LMOVM for patient to call back,  I don't think it is the cause.

## 2023-03-22 ENCOUNTER — Telehealth: Payer: Self-pay | Admitting: Neurology

## 2023-03-22 NOTE — Telephone Encounter (Signed)
 Pt called in and left a message with the access nurse. She stated she got a call from the MRI place to schedule another one. She says she just had one and is wondering why she needs to do another one?

## 2023-03-23 NOTE — Telephone Encounter (Signed)
 Called pt and informed her of results per [facility] that the MRI  in a year is to kept a check on the meningioma, which is benign. She was okay with it.

## 2023-05-26 ENCOUNTER — Other Ambulatory Visit: Payer: Self-pay | Admitting: Neurology

## 2023-06-10 NOTE — Progress Notes (Signed)
 NEUROLOGY FOLLOW UP OFFICE NOTE  Lacey Salas [id number]  Assessment/Plan:   Essential tremor Memory deficits - may be secondary to stress/pain, cerebrovascular disease, or underlying neurodegenerative disease Gait instability secondary to polyneuropathy and lumbar spinal stenosis Hemiplegic migraine - stable Ocular migraine - stable Complex regional pain syndrome of right upper extremity Depression and anxiety    Migraines have been well-controlled on citalopram.  However, her depression and anxiety has not bee well-controlled.  Will switch patient to [facility] and her PCP may then switch citalopram to another antidepressant for her depression and anxiety For migraine prevention: Plan to start Emgality.  Once she started, she may work with her PCP to change citalopram to another antidepressant. Monitor tremor for now.  If needed, would start primidone as she is already on a beta blocker  Neuropsychological evaluation scheduled on 4/7 Follow up 6 months.      Subjective:  Lacey Salas is a 78 year old right-handed woman with RA, anxiety, depression, migraine, complex regional pain syndrome of right hand, hyperlipidemia, osteopenia, MVP and Stevens-Johnson syndrome who follows up for complicated migraines (hemiplegic migraine and ocular migraine) as well as now tremor.     UPDATE:  Tremor: Stable.  Does cause difficulty with writing but does not feel it warrants starting a medication.  Migraines: She seldom has a migraine.  She may have a more dull non-pressure headache at times.    Nightmares everynight, dizziness, lightheadedness  Memory: 12/14/2022 LABS:  B12 276 and TSH 1.79.  Started supplement.  No change.  Dealing with stress and chronic pain.  Advised to start B12 daily.    MRI of brain with and without contrast on 02/20/2023 personally reviewed showed moderate-severe chronic small vessel ischemic changes as well as 2 cm posterior fossa meningioma slightly  indenting the cerebellum without associated brain edema. Neuropsychological evaluation is scheduled on 4/7.   Current NSAIDS:  ASA 81mg  daily Current analgesics:  Tylenol Current triptans:  no Current anti-emetic:  no Current muscle relaxants:  no Current anti-anxiolytic:  none Current sleep aide:  no Current Antihypertensive medications:  atenolol 25mg  Current Antidepressant medications:  citalopram 40mg  Current Anticonvulsant medications:  no Current Vitamins/Herbal/Supplements:  no Current Antihistamines/Decongestants:  no Other therapy:  no Prednisone and Plaquenil for RA   Caffeine:  no Alcohol:  no Smoker:  Quit 34 years ago but started again due to recent tragedy Depression:  Significant depression and anxiety.   HISTORY: Migraines: She has been evaluated by several neurologists and headache specialists, such as Dr. Avie Echevaria, Dr. Fonnie Jarvis, Dr. Rudene Christians, Dr. Wandra Arthurs, and Dr. Marcelino Freestone. Occurs spontaneously, including while driving. Onset:  Has history of typical migraines (with headache, nausea and vomiting) since age 63.  This subsided and started to have these current spells starting in her early 31s.  70% of the time it is accompanied by headache. Location:  holocephalic Quality:  Non-throbbing pressure Initial intensity:  5/10 Aura:  no Prodrome:  Indescribable "foreboding" feeling Associated symptoms:  Migraines typically present as TIA-like symptoms, including right-sided weakness involving face, arm and leg, photophobia, word-finding difficulties, difficulty getting words out, slurred speech, staggering gait.  She is conscious and alert but doesn't feel like she can function during the spell.  Following episodes, she is fatigued and will sleep for 2-3 hours.  On rare occasions, accompanied by visual field cut. Initial duration:  30 minutes Initial frequency:  Varies.  Usually once a month but may be 3 times a week. Triggers:  Chemical smells,  some perfumes, bright lights Relieving factors:  Laying down Activity:  Cannot function   MRI/MRA of head from 06/15/08 were unremarkable. EEGs from 11/02/12 and 09/27/13 were normal.   Past abortive therapy:  none Past preventative therapy:  Amitriptyline (initially for complex regional pain syndrome), venlafaxine (racing thoughts), Sinequan, onriboflavin, Cymbalta, gabapentin   Family history of headache:  Maternal grandmother, sister   Neuropathy: She reports numbness, tingling and burning in the feet.  No weakness or radicular back pain.  Sed rate mildly elevated at 42.  ANA, CRP, SPEP/IFE, TSH and B12 (610) were unremarkable.  She has rheumatoid arthritis.  She has lumbar spinal stenosis (L4-5).  At first, it was thought to be secondary to her hip.  If she tries to walk more than 15 feet, she is in pain.  She has left sided hip pain down the leg.  She had two epidurals which were ineffective. Failed physical therapy.  Told she is not a candidate for surgery.    Tremor: Started noticing tremors in 2023.  She notices it with use, particularly when holding objects such as writing, holding utensils.  It shakes when she is holding the bible up at church.  Not at rest.  Notices it slightly in the left hand.  She has complex regional pain syndrome in the right hand.  It has progressively gotten worse.  Balance is poor.  She has pain an discoloration in the toes.  She has spinal stenosis in the lumbar spine.  No family history of tremor.     Visual Disturbance: In June 2020, she had episode of sensation that eyes are crossing but for 10-15 seconds but denies actually seeing double vision or blurred vision, occurring once or twice a day and may go several days before it occurs again.  No associated headache.  Sometimes she thinks she sees something in her peripheral vision that isn't there.  One time, she thought she saw a fire on the burner which isn't there..  She also notes uncontrolled twitching of  the legs or upper part of her body, lasting a few seconds.  She also has been feeling lightheaded.  This started prior to starting prednisone and Plaquenil.  MRI of brain without contrast was performed on 10/20/2018 demonstrated moderate chronic small vessel ischemic changes in the cerebral white matter and pons.  Due to endorsing myoclonus, MRI of cervical spine was also performed and showed noncompressive degenerative changes but normal cervical spinal cord. She had a formal eye exam which was unremarkable.  No longer having these symptoms.  Unclear if it may have been related to prednisone.  Memory deficits: She is concerned about her memory.  She reports word-finding issues, trouble remembering names.  May forget what she ate for breakfast that morning.    She also is forgetting some recipes.  She sometimes has trouble remembering how to drive to familiar places but will get it after a couple of minutes.  Still independent.  Able to pay bills without difficulty.No family history of Alzheimer's but she said her mother had cognitive difficulties after taking prednisone.  Thyroid level was normal.  She does suffer from chronic back pain.  PAST MEDICAL HISTORY: Past Medical History:  Diagnosis Date   Anxiety    Depression    GERD (gastroesophageal reflux disease)    History of chicken pox    Hyperlipidemia    Migraine    complicated migraines with transient R side facial paralysis and aphasia   Migraine  headache with aura    hemiplegic migraine   MVP (mitral valve prolapse)    Osteopenia    Reflex sympathetic dystrophy, unspecified 2011   R hand related to wrist fx, improved s/p nerve blocks   Seizures (HCC)    Stevens-Johnson disease (HCC)     MEDICATIONS: Current Outpatient Medications on File Prior to Visit  Medication Sig Dispense Refill   Acetaminophen (TYLENOL 8 HOUR PO) Take by mouth daily.     atenolol (TENORMIN) 25 MG tablet TAKE 1 TABLET BY MOUTH EVERY DAY (Patient taking  differently: Take 25 mg by mouth 2 (two) times daily. 1/2 tab in the morning, and 1/2 tab at night) 90 tablet 2   citalopram (CELEXA) 40 MG tablet TAKE 1 TABLET BY MOUTH EVERY DAY 90 tablet 0   esomeprazole (NEXIUM) 20 MG capsule Take 1 capsule (20 mg total) by mouth daily before breakfast. OVC     Multiple Vitamins-Minerals (VITAMIN D3 COMPLETE PO) Take by mouth daily. (Patient not taking: Reported on 12/14/2022)     Turmeric (QC TUMERIC COMPLEX PO) Take by mouth.     No current facility-administered medications on file prior to visit.    ALLERGIES: Allergies  Allergen Reactions   Cephalexin     REACTION: Levonne Spiller   Epinephrine     REACTION: hypertensive reaction   Erythromycin     REACTION: Rash   Lactated Ringers     REACTION: cardiac symptoms   Nortriptyline     Rapid heart rate   Penicillins     REACTION: Rash   Sulfamethoxazole-Trimethoprim     REACTION: Levonne Spiller   Sulfonamide Derivatives    Trimethoprim     REACTION: stevens johnson syndrome   Wellbutrin [Bupropion] Other (See Comments)    tremors    FAMILY HISTORY: Family History  Problem Relation Age of Onset   Leukemia Brother        CLL   Cancer Brother    Arthritis Mother    Arthritis Father    Breast cancer Sister    Cancer Sister    Arthritis Other    Ovarian cancer Other    Hyperlipidemia Other    Hypertension Other    Stroke Other    Colon cancer Neg Hx       Objective:  Blood pressure 116/68, pulse 80, height 5\' 3"  (1.6 m), weight 142 lb 9.6 oz (64.7 kg), SpO2 97%. General: No acute distress.  Patient appears well-groomed.   Head:  Normocephalic/atraumatic Neck:  Supple.  No paraspinal tenderness.  Full range of motion. Heart:  Regular rate and rhythm. Neuro:  Alert and oriented.  Speech fluent and not dysarthric.  Language intact.  CN II-XII intact.  Bulk and tone normal.  Muscle strength 5/5 throughout.  Deep tendon reflexes 2+ throughout.  Gait normal.  Romberg  negative.   Shon Millet, DO   CC:  Jasper Loser, MD

## 2023-06-14 ENCOUNTER — Ambulatory Visit: Payer: Medicare Other | Admitting: Neurology

## 2023-06-14 ENCOUNTER — Encounter: Payer: Self-pay | Admitting: Neurology

## 2023-06-14 VITALS — BP 116/68 | HR 80 | Ht 63.0 in | Wt 142.6 lb

## 2023-06-14 DIAGNOSIS — D329 Benign neoplasm of meninges, unspecified: Secondary | ICD-10-CM

## 2023-06-14 DIAGNOSIS — R413 Other amnesia: Secondary | ICD-10-CM | POA: Diagnosis not present

## 2023-06-14 DIAGNOSIS — G43409 Hemiplegic migraine, not intractable, without status migrainosus: Secondary | ICD-10-CM | POA: Diagnosis not present

## 2023-06-14 MED ORDER — EMGALITY 120 MG/ML ~~LOC~~ SOAJ
240.0000 mg | Freq: Once | SUBCUTANEOUS | 0 refills | Status: AC
Start: 2023-06-14 — End: 2023-06-14

## 2023-06-14 NOTE — Patient Instructions (Signed)
 Start Emgality - 2 injections for first dose, then 1 injection every 28 days thereafter.  When you pick up the first dose, contact me and I will send in standing order. Continue citalopram until then

## 2023-06-16 ENCOUNTER — Telehealth: Payer: Self-pay | Admitting: Pharmacist

## 2023-06-16 NOTE — Telephone Encounter (Signed)
 Pharmacy Patient Advocate Encounter  Received notification from [facility] that Prior Authorization for Emgality 120MG /ML auto-injectors (migraine) has been APPROVED from 06/15/2023 to 03/15/2024   PA #/Case ID/Reference #: [id number]

## 2023-06-16 NOTE — Telephone Encounter (Signed)
 Pharmacy Patient Advocate Encounter   Received notification from Patient Pharmacy that prior authorization for Emgality 120MG /ML auto-injectors (migraine) is required/requested.   Insurance verification completed.   The patient is insured through [facility] .   Per test claim: PA required; PA submitted to above mentioned insurance via CoverMyMeds Key/confirmation #/EOC [id number] Status is pending

## 2023-06-17 NOTE — Telephone Encounter (Signed)
 LMOVM for patient of the approval, Pharmacy aware as well copay $300

## 2023-06-21 ENCOUNTER — Ambulatory Visit: Payer: Medicare Other | Admitting: Psychology

## 2023-06-21 DIAGNOSIS — R4189 Other symptoms and signs involving cognitive functions and awareness: Secondary | ICD-10-CM

## 2023-06-21 DIAGNOSIS — F418 Other specified anxiety disorders: Secondary | ICD-10-CM | POA: Diagnosis not present

## 2023-06-21 NOTE — Progress Notes (Unsigned)
 NEUROPSYCHOLOGICAL EVALUATION [place]. [facility]  [place] Department of Neurology  Date of Evaluation: 06/21/2023  REASON FOR REFERRAL   Lacey Salas is a 78 year old, right-handed, White female with 14 years of formal education. She was referred for neuropsychological evaluation by her neurologist, Shon Millet, D.O., to assess current neurocognitive functioning, document potential cognitive deficits, and assist with treatment planning. This is her first neuropsychological evaluation.  SUMMARY OF RESULTS   Premorbid cognitive abilities are estimated to be in the average range based on word reading and sociodemographic factors. On testing today, performance was broadly within age-related expectations, with the exceptions of a low score on a line orientation judgment task, errors in shifting and sequencing on an alternating attention task, and relatively low initial encoding on a visual memory task. Regarding the latter, her recall and recognition of the shapes was within normal limits despite low immediate recall. Learning/memory was otherwise intact for a word list and short stories. Remaining scores in the domains of attention/working memory, processing speed, executive functioning, language, visuoconstruction, and fine motor dexterity were intact. On self-report questionnaires, she endorsed severe levels of anxiety. She did not endorse clinically-elevated levels of depression or excessive daytime sleepiness.  DIAGNOSTIC IMPRESSION   Results of the current evaluation were broadly within expectations, aside from a few isolated low scores that did not cluster to any single domain. She does not show evidence of a neurocognitive disorder at this time. Isolated weaknesses identified today are not overly concerning in the broader context of her assessment. Etiology of subjective cognitive concerns is most likely related to cerebrovascular changes, chronic pain, anxiety, and situational  stressors. To the extent that modifiable factors can be ameliorated, and she can improve how she feels overall, she may find that her subjective cognitive concerns improve. Results further serve as a baseline for future comparison, should it ever become necessary to re-evaluate the patient. Recommendations are listed below.  ICD-10 Codes: R41.89 Cognitive changes; F41.8 Mixed anxiety and depressive disorder  RECOMMENDATIONS   In consultation with your doctor, schedule cognitive reevaluation on an as-needed basis to assess for cognitive decline and update treatment recommendations.  Patient is encouraged to prioritize smoking cessation given the implications to overall health, particularly in the context of vascular risk factors. The following websites provide a range of supportive resources that may assist in the quitting process. [employer]  Continue treatment for depression and anxiety, especially given that emotional distress can exacerbate cognitive difficulties. Discuss current medication regimen with your prescribing provider to ensure you are receiving maximum benefit. Having a counselor may also be beneficial while you are navigating multiple complex medical conditions and considering smoking cessation.  Continue managing vascular risk factors through a heart healthy diet, physician-approved physical activity, and medication adherence.   Participate in activities that you find enjoyable and fulfilling, whether that be hobbies, socializing with loved ones, or being outdoors. This can improve mood, increase motivation, and offer cognitive stimulation.  Patient should consider implementing compensatory strategies to maximize independence and maintain daily functioning. Examples include:  -Adhere to routine. Compensatory strategies work best when they are used consistently. Use a planner, calendar, or white board  that has the schedule and important events for the day clearly listed to reference and cross off when tasks are complete.  -Ask for written information, especially if it is new or unfamiliar (e.g., information provided at a doctor's appointment).  -Create an organized environment. Keep items that can be easily misplaced in a sensible location and get  into the habit of always returning the items to those places.  -Pay attention and reduce distractions. Make a point of focusing attention on information you want to remember. One-on-one interaction is more likely to facilitate attention and minimize distraction. Make eye contact and repeat the information out loud after you hear it. Reduce interruptions or distractions especially when attempting to learn new information.  -Create associations. When learning something new, think about and understand the information. Explain it in your own words or try to associate it with something you already know. Take notes to help remember important details. -Evaluate goals and plan accordingly. When confronted by many different tasks, begin by making a list that prioritizes each task and estimates the time it will take to complete. Break down complicated tasks into smaller, more manageable steps.  -Focus on one task at a time and complete each task before starting another. Avoid multitasking.   DISPOSITION   Patient will follow up with the referring provider, Dr. Everlena Cooper. No follow-up neuropsychological testing was scheduled at this time. Please feel free to refer the patient for repeated evaluation if she shows a significant change in neurocognitive status. She will be provided verbal feedback in approximately one week regarding the findings and impression during this visit.  The remainder of the report includes the details of the patient's background and a table of results from the current evaluation, which support the summary and recommendations described  above.  BACKGROUND   History of Presenting Illness: The following information was obtained from a review of medical records and an interview with the patient. Patient has been followed by Dr. Everlena Cooper since 2015 for the management of multiple neurological conditions. Currently, this includes essential tremor, gait instability secondary to polyneuropathy and lumbar spinal stenosis, hemiplegic migraine, ocular migraine, complex regional pain syndrome of right upper extremity, and memory changes. Regarding the latter, she noted general forgetfulness (e.g., what she ate for breakfast, recipes, travel routes) as well as word- and name-finding difficulties. MoCA (12/14/2022) = 23/30. Functional independence is maintained. During her last neurology visit on 06/14/2023, it was documented that migraines are well-controlled with medication, while anxiety and depression remain ongoing concerns. Etiology of cognitive concerns is thought to be related at least in part to mood/stress, chronic pain, and cerebrovascular change.  Cognitive Functioning: During today's appointment, the patient reported an insidious onset of cognitive changes that have worsened since November 2024, which she attributed to stress related to her own health as well as concerns about the health of her close friends and their family members. Today, she denies significant memory concerns but acknowledges that she has "a lot going on" and believes this has contributed to lapses in memory and attention (e.g., messing up a recipe, relying more heavily on her GPS). She endorsed occasional word finding difficulties but otherwise did not report problems with processing speed or executive functioning.  Physical Functioning: Patient denied difficulties with sleep initiation and maintenance but reported frequent nightmares. She experiences low energy levels throughout the day. Appetite is stable. No changes to sense of taste or smell were reported. Vision and  hearing are stable. She continues to experience balance difficulties and sustained a fall approximately two weeks ago, though she did not hit her head. She has tremors in her hands (R>L).  Emotional Functioning: Patient reported her current mood as "more anxious and depressed than usual." As previously mentioned, she attributes this to her own health concerns as well as to the health of her close friends and their  family members. She denied suicidal ideation. She acknowledged questioning the purpose of life when experiencing significant pain but denied any thoughts that life is not worth living as well as any intent or plan to harm herself. She cited her faith as a primary coping source. She enjoys gardening.  Imaging: MRI of the brain (02/20/2023) documented moderate-to-severe chronic small vessel ischemic disease and an enhancing extra-axial mass in the posterior fossa consistent with a meningioma.  Other Relevant Medical History: Remarkable for hyperlipidemia, fibromyalgia, osteopenia, and GERD. She reported a history of a few head injuries but denied any associated loss of consciousness or lasting cognitive effects. No history of stroke, CNS infection, or seizure was reported.  Current Medications and Supplements/Vitamins: Per patient, acetaminophen, atenolol, citalopram, esomeprazole, magnesium, multiple vitamins-minerals, turmeric, and vitamin B12.   Functional Status: Patient independently performs all ADLs and IADLs. She continues to drive without reported accidents or tickets. She co-manages the household finances with her husband and denied making any errors. She independently manages her medications without difficulty.  Family Neurological History: Patient noted that her mother developed cognitive impairment following significant prednisone use. Family neurological history is otherwise unremarkable.  Psychiatric History: Remarkable for depression and anxiety managed with medication. She  previously participated in counseling for situational stressors, including the loss of family members and marital difficulties, but is not currently in therapy. She reported one prior psychiatric hospitalization but maintained that she was not suicidal at the time. She explained that a religious comment about being spiritually prepared for death was misinterpreted as an expression of suicidal intent. History of hallucinations was denied.  Substance Use History: Patient denied current use of alcohol, marijuana, and illicit substances. She previously quit smoking cigarettes approximately 35 years ago but resumed about 10 years ago due to stress. She currently averages one pack per day.  Social and Developmental History: Patient was born in [place], [place]. History of perinatal complications and developmental delays was not reported. She is married and has two children. She lives with her husband in their private residence.  Educational and Occupational History: No history of childhood learning disability, special education services, or grade retention was reported. Patient earned an associate's degree in business and later began nursing training. She was two quarters short of completing the program but became pregnant at the time and did not return to finish. 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 She retired approximately 12-13 years ago.  BEHAVIORAL OBSERVATIONS   Patient arrived on time and was unaccompanied. She ambulated independently and without gait disturbance. She was alert and fully oriented. She was appropriately groomed and dressed for the setting. Bilateral hand tremor was observed. Vision and hearing were adequate for testing purposes. Speech was of normal rate, prosody, and volume. No conversational word-finding difficulties, paraphasic errors, or dysarthria were observed. Comprehension was conversationally intact. Thought processes were linear,  logical, and coherent. Thought content was organized and devoid of delusions. Insight appeared intact. Affect was even and congruent with euthymic mood. She was cooperative and gave adequate effort during testing, including on standalone and embedded measures of performance validity. Results are thought to accurately reflect her cognitive functioning at this time.  NEUROPSYCHOLOGICAL TESTING RESULTS   Tests Administered: Animal Naming Test; Brief Visuospatial Memory Test-Revised (BVMT-R) - Form 1; Controlled Oral Word Association Test (COWAT): FAS; Delis-Kaplan Executive Function System (D-KEFS) - Subtest(s): Color-Word Interference Test; Epworth Sleepiness Scale (ESS); Geriatric Anxiety Scale-10 Item (GAS-10); Geriatric Depression Scale Short Form (  GDS-SF); Grooved Pegboard Test; [employer] Verbal Learning Test Revised (HVLT-R) - From 1; Judgment of Line Orientation (JLO) - Form H; Neuropsychological Assessment Battery (NAB) - Subtest(s): Naming Form 1; Standalone performance validity test (PVT); Test of Premorbid Functioning (TOPF); Trail Making Test (TMT); Wechsler Adult Intelligence Scale Fourth Edition (WAIS-IV) - Subtest(s): Clinical cytogeneticist, Matrix Reasoning, Similarities, Digit Span, Symbol Search, Coding; and Wechsler Memory Scale Fourth Edition (WMS-IV) - Subtest(s): Logical Memory (LM).  Test results are provided in the table below. Whenever possible, the patient's scores were compared against age-, sex-, and education-corrected normative samples. Interpretive descriptions are based on the AACN consensus conference statement on uniform labeling (Guilmette et al., 2020).  PREMORBID FUNCTIONING RAW  RANGE  TOPF 50 StdS=108 Average  ATTENTION & WORKING MEMORY RAW  RANGE  WAIS-IV Digit Span -- ss=11 Average  Forward -- ss=12 High Average  Backward -- ss=9 Average  Sequencing -- ss=10 Average  PROCESSING SPEED RAW  RANGE  Trails A 32''0e T=54 Average  WAIS-IV Symbol Search -- ss=11 Average  WAIS-IV  Coding  -- ss=12 High Average  DKEFS CWIT Color Naming 25''0e ss=13 High Average  DKEFS CWIT Word Reading 17''0e ss=14 High Average  EXECUTIVE FUNCTION RAW  RANGE  Trails B 152''3e T=40 Low Average  WAIS-IV Matrix Reasoning -- ss=11 Average  WAIS-IV Similarities -- ss=10 Average  COWAT Letter Fluency 14+12+17 T=53 Average  DKEFS CWIT Inhibition 93''0e ss=7 Low Average  DKEFS CWIT Inhibition/Switching 74''1e ss=11 Average  LANGUAGE RAW  RANGE  COWAT Letter Fluency 14+12+17 T=53 Average  Animal Naming Test 18 T=49 Average  NAB Naming Test 30/31 T=60 WNL  VISUOSPATIAL RAW  RANGE  WAIS-IV Block Design -- ss=7 Low Average  JLO C/S=16/30 1.5%ile Exceptionally Low  BVMT-R Copy Trial 12/12 -- WNL  VERBAL LEARNING & MEMORY RAW  RANGE  HVLT Learning Trials (7+8+9)/36 T=52 Average  HVLT Delayed Recall 8/12 T=49 Average  HVLT Recognition Hits 9 -- --  HVLT Recognition False Positives 1 -- --  HVLT Discrimination Index 8 T=37 Low Average  WMS-IV LM-I  (7+8+9)/53 ss=8 Average  WMS-IV LM-II  (4+9)/39 ss=9 Average  WMS-IV LM Recognition  (7+10)/23 26-50%ile Average  VISUOSPATIAL LEARNING & MEMORY RAW  RANGE  BVMT-R Total Recall (1+6+5)/36 T=36 Below Average  BVMT-R Delayed Recall 5/12 T=39 Low Average  BVMT-R Percent Retained 83 >16%ile WNL  BVMT-R Recognition Hits 5 >16%ile WNL  BVMT-R Recognition False Alarms 0 >16%ile WNL  BVMT-R Recognition Discrimination Index 5 >16%ile WNL  FINE MOTOR DEXTERITY RAW  RANGE  Grooved Pegboard (Dominant Hand) 94''1d T=44 Average  Grooved Pegboard (Non-Dominant Hand) 120''0d T=40 Low Average  QUESTIONNAIRES RAW  RANGE  GDS-SF 4 -- Minimal  GAS-10 15 -- Severe  ESS 7 -- WNL  *Note: ss = scaled score; StdS = standard score; T = t-score; C/S = corrected raw score; WNL = within normal limits; BNL= below normal limits; D/C = discontinued. Scores from skewed distributions are typically interpreted as WNL (>=16th %ile) or BNL (<16th %ile).   INFORMED CONSENT    Patient was provided with a verbal description of the nature and purpose of the neuropsychological evaluation. Also reviewed were the foreseeable risks and/or discomforts and benefits of the procedure, limits of confidentiality, and mandatory reporting requirements of this provider. Patient was given the opportunity to have their questions answered. Oral consent to participate was provided by the patient.   This report was prepared as part of a clinical evaluation and is not intended for forensic use.  SERVICE  This evaluation was conducted by Annice Pih, Psy.D. In addition to time spent directly with the patient, total professional time includes record review, integration of relevant medical history, test selection, interpretation of findings, and report preparation. A technician, Wallace Keller, B.S., provided testing and scoring assistance for 115 minutes.  Psychiatric Diagnostic Evaluation Services (Professional): 16109 x 1 Neuropsychological Testing Evaluation Services (Professional): 60454 x 1 Neuropsychological Testing Evaluation Services (Professional): [id number] x 1 Neuropsychological Test Administration and Scoring Radiographer, therapeutic): [phone] x 1 Neuropsychological Test Administration and Scoring (Technician): [phone] x 3  This report was generated using voice recognition software. While this document has been carefully reviewed, transcription errors may be present. I apologize in advance for any inconvenience. Please contact me if further clarification is needed.            Annice Pih, Psy.D.             Neuropsychologist

## 2023-06-21 NOTE — Progress Notes (Signed)
   Psychometrician Note   Cognitive testing was administered to Lacey Salas by Lacey Salas, B.S. (psychometrist) under the supervision of Lacey Salas, Psy.D., licensed psychologist on 06/21/2023. Lacey Salas did not appear overtly distressed by the testing session per behavioral observation or responses across self-report questionnaires. Rest breaks were offered.    The battery of tests administered was selected by Lacey Salas, Psy.D. with consideration to Lacey Salas's current level of functioning, the nature of her symptoms, emotional and behavioral responses during interview, level of literacy, observed level of motivation/effort, and the nature of the referral question. This battery was communicated to the psychometrist. Communication between Lacey Salas, Psy.D. and the psychometrist was ongoing throughout the evaluation and Lacey Salas, Psy.D. was immediately accessible at all times. Lacey Salas, Psy.D. provided supervision to the psychometrist on the date of this service to the extent necessary to assure the quality of all services provided.    Lacey Salas will return within approximately 1-2 weeks for an interactive feedback session with Lacey Salas at which time her test performances, clinical impressions, and treatment recommendations will be reviewed in detail. Lacey Salas understands she can contact our office should she require our assistance before this time.  A total of 115 minutes of billable time were spent face-to-face with Lacey Salas by the psychometrist. This includes both test administration and scoring time. Billing for these services is reflected in the clinical report generated by Lacey Salas, Psy.D.  This note reflects time spent with the psychometrician and does not include test scores or any clinical interpretations made by Lacey Salas. The full report will follow in a separate note.

## 2023-06-28 ENCOUNTER — Ambulatory Visit: Payer: Medicare Other | Admitting: Psychology

## 2023-06-28 DIAGNOSIS — R4189 Other symptoms and signs involving cognitive functions and awareness: Secondary | ICD-10-CM

## 2023-06-28 DIAGNOSIS — F418 Other specified anxiety disorders: Secondary | ICD-10-CM | POA: Diagnosis not present

## 2023-06-28 NOTE — Progress Notes (Signed)
   NEUROPSYCHOLOGY FEEDBACK SESSION [place]. [facility]  [place] Department of Neurology  Date of Feedback Session: 06/28/2023  REASON FOR REFERRAL   Renee Erb is a 78 year old, right-handed, White female with 14 years of formal education. She was referred for neuropsychological evaluation by her neurologist, Janne Members, D.O., to assess current neurocognitive functioning, document potential cognitive deficits, and assist with treatment planning. This is her first neuropsychological evaluation.   FEEDBACK   Patient completed a comprehensive neuropsychological evaluation on 06/21/2023. Please refer to that encounter for the full report and recommendations. Briefly, results were broadly within expectations, aside from a few isolated low scores that did not cluster to any single domain. She does not show evidence of a neurocognitive disorder at this time. Isolated weaknesses identified are not overly concerning in the broader context of her assessment. Etiology of subjective cognitive concerns is most likely related to cerebrovascular changes, chronic pain, anxiety, and situational stressors. To the extent that modifiable factors can be ameliorated, and she can improve how she feels overall, she may find that her subjective cognitive concerns improve. Results further serve as a baseline for future comparison, should it ever become necessary to re-evaluate the patient   Today, the patient was unaccompanied. She was provided verbal feedback regarding the findings and impression during this visit, and her questions were answered. A copy of the report was provided at the conclusion of the visit.  DISPOSITION   Patient will follow up with the referring provider, Dr. Festus Hubert. No follow-up neuropsychological testing was scheduled at this time. Please feel free to refer the patient for repeated evaluation if she shows a significant change in neurocognitive status.  SERVICE   This feedback  session was conducted by Janice Meeter, Psy.D. One unit of 16109 was billed for Dr. Donavon Fudge' time spent in preparing, conducting, and documenting the current feedback session.  This report was generated using voice recognition software. While this document has been carefully reviewed, transcription errors may be present. I apologize in advance for any inconvenience. Please contact me if further clarification is needed.

## 2023-07-13 ENCOUNTER — Encounter (HOSPITAL_COMMUNITY): Payer: Self-pay | Admitting: Emergency Medicine

## 2023-07-13 ENCOUNTER — Other Ambulatory Visit: Payer: Self-pay

## 2023-07-13 ENCOUNTER — Emergency Department (HOSPITAL_COMMUNITY)
Admission: EM | Admit: 2023-07-13 | Discharge: 2023-07-13 | Disposition: A | Discharge status: Home / Self Care | Attending: Emergency Medicine | Admitting: Emergency Medicine

## 2023-07-13 DIAGNOSIS — R42 Dizziness and giddiness: Secondary | ICD-10-CM | POA: Diagnosis not present

## 2023-07-13 DIAGNOSIS — R6889 Other general symptoms and signs: Secondary | ICD-10-CM | POA: Diagnosis not present

## 2023-07-13 DIAGNOSIS — R6883 Chills (without fever): Secondary | ICD-10-CM | POA: Diagnosis not present

## 2023-07-13 DIAGNOSIS — R110 Nausea: Secondary | ICD-10-CM | POA: Diagnosis not present

## 2023-07-13 DIAGNOSIS — R404 Transient alteration of awareness: Secondary | ICD-10-CM | POA: Diagnosis not present

## 2023-07-13 DIAGNOSIS — R5383 Other fatigue: Secondary | ICD-10-CM | POA: Insufficient documentation

## 2023-07-13 DIAGNOSIS — R4701 Aphasia: Secondary | ICD-10-CM | POA: Diagnosis not present

## 2023-07-13 DIAGNOSIS — R531 Weakness: Secondary | ICD-10-CM | POA: Diagnosis not present

## 2023-07-13 DIAGNOSIS — R251 Tremor, unspecified: Secondary | ICD-10-CM | POA: Insufficient documentation

## 2023-07-13 LAB — BASIC METABOLIC PANEL WITH GFR
Anion gap: 10 (ref 5–15)
BUN: 9 mg/dL (ref 8–23)
CO2: 20 mmol/L — ABNORMAL LOW (ref 22–32)
Calcium: 8.9 mg/dL (ref 8.9–10.3)
Chloride: 108 mmol/L (ref 98–111)
Creatinine, Ser: 0.6 mg/dL (ref 0.44–1.00)
GFR, Estimated: >60 mL/min (ref >60)
Glucose, Bld: 149 mg/dL — ABNORMAL HIGH (ref 70–99)
Potassium: 4.1 mmol/L (ref 3.5–5.1)
Sodium: 138 mmol/L (ref 135–145)

## 2023-07-13 LAB — URINALYSIS, ROUTINE W REFLEX MICROSCOPIC
Bacteria, UA: NONE SEEN
Bilirubin Urine: NEGATIVE
Glucose, UA: NEGATIVE mg/dL
Ketones, ur: NEGATIVE mg/dL
Leukocytes,Ua: NEGATIVE
Nitrite: NEGATIVE
Protein, ur: NEGATIVE mg/dL
Specific Gravity, Urine: 1.011 (ref 1.005–1.030)
pH: 6 (ref 5.0–8.0)

## 2023-07-13 LAB — RESP PANEL BY RT-PCR (RSV, FLU A&B, COVID)  RVPGX2
Influenza A by PCR: NEGATIVE
Influenza B by PCR: NEGATIVE
Resp Syncytial Virus by PCR: NEGATIVE
SARS Coronavirus 2 by RT PCR: NEGATIVE

## 2023-07-13 LAB — CBC
HCT: 42.6 % (ref 36.0–46.0)
Hemoglobin: 13.8 g/dL (ref 12.0–15.0)
MCH: 30.9 pg (ref 26.0–34.0)
MCHC: 32.4 g/dL (ref 30.0–36.0)
MCV: 95.3 fL (ref 80.0–100.0)
Platelets: 273 10*3/uL (ref 150–400)
RBC: 4.47 MIL/uL (ref 3.87–5.11)
RDW: 12.8 % (ref 11.5–15.5)
WBC: 9.1 10*3/uL (ref 4.0–10.5)
nRBC: 0 % (ref 0.0–0.2)

## 2023-07-13 LAB — HEPATIC FUNCTION PANEL
ALT: 14 U/L (ref 0–44)
AST: 22 U/L (ref 15–41)
Albumin: 3.4 g/dL — ABNORMAL LOW (ref 3.5–5.0)
Alkaline Phosphatase: 84 U/L (ref 38–126)
Bilirubin, Direct: 0.2 mg/dL (ref 0.0–0.2)
Indirect Bilirubin: 0.5 mg/dL (ref 0.3–0.9)
Total Bilirubin: 0.7 mg/dL (ref 0.0–1.2)
Total Protein: 6.5 g/dL (ref 6.5–8.1)

## 2023-07-13 MED ORDER — PROCHLORPERAZINE EDISYLATE 10 MG/2ML IJ SOLN
5.0000 mg | Freq: Once | INTRAMUSCULAR | Status: AC
  Administered 2023-07-13: 5 mg via INTRAVENOUS
  Filled 2023-07-13: qty 2

## 2023-07-13 NOTE — ED Provider Notes (Signed)
 I was asked to follow-up on the patient's labs and reassess for ultimate disposition.  Patient presents today with lightheadedness that has since resolved.  She states that she has been here in the emergency department she is feeling much better.  She denies any focal weakness, numbness, or tingling.  Denies any headache.  Denies any chest pain.  The patient's EKG did show some borderline QT prolongation.  I did call and discussed this with Dr. Otho Blitz.  Looking back it does appear that this is been the case for quite some time.  She does recommend outpatient follow-up.  The patient is discharged and does have a primary care doctor tomorrow.  She is discharged with return precautions.  Physical Exam  BP (!) 149/64 (BP Location: Right Arm)   Pulse 69   Temp 97.9 F (36.6 C) (Oral)   Resp 20   Ht 5\' 3"  (1.6 m)   Wt 63.5 kg   SpO2 96%   BMI 24.80 kg/m   Physical Exam Gen: NAD  Procedures  Procedures  ED Course / MDM    Medical Decision Making Amount and/or Complexity of Data Reviewed Labs: ordered.  Risk Prescription drug management.          Carin Charleston, MD 07/13/23 2130

## 2023-07-13 NOTE — ED Triage Notes (Signed)
 Pt BIB EMS with complaints of weakness and dizziness that started a few days ago. Pt states dizziness has been getting worse and then started to have head and neck twitch/tremor. Pt has a hx of same and states usually these symptoms present before a migraine and extreme fatigue will follow. Denies any pain.

## 2023-07-13 NOTE — Discharge Instructions (Signed)
 Your workup today was reassuring.  Please follow-up with your doctor to have an EKG read done.  Your QTc interval was mildly elevated here.  Our cardiology team felt that she could follow-up regarding this.  Talk to them about the Celexa  to see if they would like to change to something different.  Return to the ER for worsening symptoms.

## 2023-07-13 NOTE — ED Notes (Signed)
 Spoke with Tiffany in lab to add on hepatic function panel

## 2023-07-13 NOTE — ED Provider Notes (Signed)
 [place] EMERGENCY DEPARTMENT AT [facility] Provider Note   CSN: [id number] Arrival date & time: 07/13/23  1435     History  Chief Complaint  Patient presents with   Weakness   Dizziness    Lacey Salas is a 78 y.o. female.   Weakness Associated symptoms: dizziness   Dizziness Associated symptoms: weakness   Patient presents with dizziness weakness and feels as if her head is tremulous.  Has had for the last few days.  Also has been much more fatigued. States she usually feels like this when she gets her migraine but has not gotten a migraine.  No cough.  No fevers.  States she has had some chills.    Past Medical History:  Diagnosis Date   Anxiety    Depression    GERD (gastroesophageal reflux disease)    History of chicken pox    Hyperlipidemia    Migraine    complicated migraines with transient R side facial paralysis and aphasia   Migraine headache with aura    hemiplegic migraine   MVP (mitral valve prolapse)    Osteopenia    Reflex sympathetic dystrophy, unspecified 2011   R hand related to wrist fx, improved s/p nerve blocks   Seizures (HCC)    Stevens-Johnson disease (HCC)     Home Medications Prior to Admission medications   Medication Sig Start Date End Date Taking? Authorizing Provider  Acetaminophen  (TYLENOL  8 HOUR PO) Take by mouth daily.    [provider]  atenolol  (TENORMIN ) 25 MG tablet TAKE 1 TABLET BY MOUTH EVERY DAY Patient taking differently: Take 25 mg by mouth 2 (two) times daily. 1/2 tab in the morning, and 1/2 tab at night 10/22/20   Hilts, Michael, MD  citalopram  (CELEXA ) 40 MG tablet TAKE 1 TABLET BY MOUTH EVERY DAY 05/26/23   Merriam Abbey, DO  cyanocobalamin  (VITAMIN B12) 500 MCG tablet Take 500 mcg by mouth daily.    [provider]  esomeprazole  (NEXIUM ) 20 MG capsule Take 1 capsule (20 mg total) by mouth daily before breakfast. OVC 09/22/12   Ferrel Hsu, NP  magnesium  30 MG tablet Take 30 mg by mouth  2 (two) times daily.    [provider]  Multiple Vitamins-Minerals (VITAMIN D3 COMPLETE PO) Take by mouth daily.    [provider]  Turmeric (QC TUMERIC COMPLEX PO) Take by mouth.    [provider]      Allergies    Cephalexin, Epinephrine, Erythromycin, Lactated ringers, Nortriptyline , Penicillins, Sulfamethoxazole-trimethoprim, Sulfonamide derivatives, Trimethoprim, and Wellbutrin  [bupropion ]    Review of Systems   Review of Systems  Neurological:  Positive for dizziness and weakness.    Physical Exam Updated Vital Signs BP 132/71   Pulse 75   Temp 98.2 F (36.8 C) (Oral)   Resp 15   Ht 5\' 3"  (1.6 m)   Wt 63.5 kg   SpO2 98%   BMI 24.80 kg/m  Physical Exam Vitals reviewed.  Constitutional:      Appearance: Normal appearance.  Eyes:     Pupils: Pupils are equal, round, and reactive to light.  Cardiovascular:     Rate and Rhythm: Regular rhythm.  Abdominal:     Tenderness: There is no abdominal tenderness.  Musculoskeletal:     Cervical back: Neck supple.  Neurological:     Mental Status: She is alert and oriented to person, place, and time.     ED Results / Procedures / Treatments  Labs (all labs ordered are listed, but only abnormal results are displayed) Labs Reviewed  BASIC METABOLIC PANEL WITH GFR - Abnormal; Notable for the following components:      Result Value   CO2 20 (*)    Glucose, Bld 149 (*)    All other components within normal limits  HEPATIC FUNCTION PANEL - Abnormal; Notable for the following components:   Albumin 3.4 (*)    All other components within normal limits  RESP PANEL BY RT-PCR (RSV, FLU A&B, COVID)  RVPGX2  CBC  URINALYSIS, ROUTINE W REFLEX MICROSCOPIC    EKG None  Radiology No results found.  Procedures Procedures    Medications Ordered in ED Medications  prochlorperazine  (COMPAZINE ) injection 5 mg (5 mg Intravenous Given 07/13/23 1613)    ED Course/ Medical Decision Making/ A&P                                  Medical Decision Making Amount and/or Complexity of Data Reviewed Labs: ordered.  Risk Prescription drug management.   Patient with complaining of fatigue some tremulousness and some dizziness.  Differential diagnosis long but does include her previous migraine which was complicated.  Also will check basic blood work and for infection.  Do not think we need head CT at this time.  Care turned over to Dr. Charlee Conine.        Final Clinical Impression(s) / ED Diagnoses Final diagnoses:  None    Rx / DC Orders ED Discharge Orders     None         Mozell Arias, MD 07/13/23 [phone]

## 2023-07-15 DIAGNOSIS — D329 Benign neoplasm of meninges, unspecified: Secondary | ICD-10-CM | POA: Diagnosis not present

## 2023-07-15 DIAGNOSIS — R2689 Other abnormalities of gait and mobility: Secondary | ICD-10-CM | POA: Diagnosis not present

## 2023-07-15 DIAGNOSIS — G252 Other specified forms of tremor: Secondary | ICD-10-CM | POA: Diagnosis not present

## 2023-07-26 ENCOUNTER — Telehealth: Payer: Self-pay

## 2023-07-26 NOTE — Telephone Encounter (Signed)
 Per patient she was seen in the ED last week. She was sent home after 8 hours.   Per patient she was seen by her PCP afterwards was advise that this could be the meningoma and wanted to order a MRI. And she was given Valium. That did not help.  Per patient she called nothing was ordered. Patient refuses to go back to [facility].  Per patient it was awful.   Advise patient if the symptoms are worse then they were last week and when She saw her PCP then she needs to go the ED.  She may ask for a MRI to be ordered at that time. But it the shaking is consent and you are now having vomiting and Nausea. Please do wait around. Go to he ED.

## 2023-07-27 DIAGNOSIS — R2689 Other abnormalities of gait and mobility: Secondary | ICD-10-CM | POA: Diagnosis not present

## 2023-07-27 DIAGNOSIS — G252 Other specified forms of tremor: Secondary | ICD-10-CM | POA: Diagnosis not present

## 2023-07-27 DIAGNOSIS — D329 Benign neoplasm of meninges, unspecified: Secondary | ICD-10-CM | POA: Diagnosis not present

## 2023-07-27 DIAGNOSIS — R9082 White matter disease, unspecified: Secondary | ICD-10-CM | POA: Diagnosis not present

## 2023-07-28 ENCOUNTER — Telehealth: Payer: Self-pay | Admitting: Neurology

## 2023-07-28 NOTE — Telephone Encounter (Signed)
 Pt called in stating she was recently diagnosed with a meningioma. She just did an MRI, but the results are not back. She is shaking all over uncontrollably, bad nausea, can't eat, and has lost 6 pounds. She has been given Xanax to try and help it. Says she is in "neurological hell". She wants to see if Dr. Festus Hubert thinks it could be coming from the meningioma?

## 2023-07-28 NOTE — Telephone Encounter (Signed)
 Per her balance is off ( Looks like a drunk person) No headaches.    Per patient she has tried chewing CBD.

## 2023-07-28 NOTE — Telephone Encounter (Signed)
 I don't know what this MRI shows that was just performed, but based on the MRI from December, that would not be the cause of her symptoms.  I don't have an explanation for sudden constant full body shaking with nausea.  We will need to get her in so that I can examine her.

## 2023-07-30 ENCOUNTER — Encounter (HOSPITAL_BASED_OUTPATIENT_CLINIC_OR_DEPARTMENT_OTHER): Payer: Self-pay | Admitting: Emergency Medicine

## 2023-07-30 ENCOUNTER — Other Ambulatory Visit: Payer: Self-pay

## 2023-07-30 ENCOUNTER — Emergency Department (HOSPITAL_BASED_OUTPATIENT_CLINIC_OR_DEPARTMENT_OTHER)
Admission: EM | Admit: 2023-07-30 | Discharge: 2023-07-30 | Disposition: A | Discharge status: Home / Self Care | Attending: Emergency Medicine | Admitting: Emergency Medicine

## 2023-07-30 DIAGNOSIS — R251 Tremor, unspecified: Secondary | ICD-10-CM | POA: Diagnosis not present

## 2023-07-30 LAB — CBC WITH DIFFERENTIAL/PLATELET
Abs Immature Granulocytes: 0.03 10*3/uL (ref 0.00–0.07)
Basophils Absolute: 0.1 10*3/uL (ref 0.0–0.1)
Basophils Relative: 1 %
Eosinophils Absolute: 0.1 10*3/uL (ref 0.0–0.5)
Eosinophils Relative: 1 %
HCT: 43.7 % (ref 36.0–46.0)
Hemoglobin: 14.8 g/dL (ref 12.0–15.0)
Immature Granulocytes: 0 %
Lymphocytes Relative: 29 %
Lymphs Abs: 3 10*3/uL (ref 0.7–4.0)
MCH: 31.3 pg (ref 26.0–34.0)
MCHC: 33.9 g/dL (ref 30.0–36.0)
MCV: 92.4 fL (ref 80.0–100.0)
Monocytes Absolute: 0.9 10*3/uL (ref 0.1–1.0)
Monocytes Relative: 8 %
Neutro Abs: 6.3 10*3/uL (ref 1.7–7.7)
Neutrophils Relative %: 61 %
Platelets: 293 10*3/uL (ref 150–400)
RBC: 4.73 MIL/uL (ref 3.87–5.11)
RDW: 12.9 % (ref 11.5–15.5)
WBC: 10.3 10*3/uL (ref 4.0–10.5)
nRBC: 0 % (ref 0.0–0.2)

## 2023-07-30 LAB — BASIC METABOLIC PANEL WITH GFR
Anion gap: 13 (ref 5–15)
BUN: 12 mg/dL (ref 8–23)
CO2: 21 mmol/L — ABNORMAL LOW (ref 22–32)
Calcium: 9.6 mg/dL (ref 8.9–10.3)
Chloride: 106 mmol/L (ref 98–111)
Creatinine, Ser: 0.59 mg/dL (ref 0.44–1.00)
GFR, Estimated: >60 mL/min (ref >60)
Glucose, Bld: 97 mg/dL (ref 70–99)
Potassium: 4.5 mmol/L (ref 3.5–5.1)
Sodium: 140 mmol/L (ref 135–145)

## 2023-07-30 LAB — MAGNESIUM: Magnesium: 2.4 mg/dL (ref 1.7–2.4)

## 2023-07-30 NOTE — Progress Notes (Signed)
 NEUROLOGY FOLLOW UP OFFICE NOTE  Lacey Salas [id number]  Assessment/Plan:   Psychogenic tremor - arrhythmic shaking with comes and goes with intermittent stuttering speech and astasia abasia Cognitive changes- may be secondary to stress/pain, cerebrovascular disease, or underlying neurodegenerative disease Hemiplegic migraine - stable Ocular migraine - stable Complex regional pain syndrome of right upper extremity Depression and anxiety Posterior fossa meningioma, incidental finding    Treatment needs to be focused on her underlying depression and anxiety.  Will refer to psychiatry Follow up with me in 6 months.  Total time spent in chart and face to face with patient and her husband:  46 minutes.     Subjective:  Lacey Salas is a 78 year old right-handed woman with RA, anxiety, depression, migraine, complex regional pain syndrome of right hand, hyperlipidemia, osteopenia, MVP and Stevens-Johnson syndrome who follows up for complicated migraines (hemiplegic migraine and ocular migraine) as well as now tremor.  She is accompanied by her husband who supplements history.   UPDATE: Tremor: Beginning 3 weeks ago, she developed sudden onset diffuse tremors.  She initially felt "funny" and then suddenly started exhibiting full body tremors of head, arms, hands and legs.  She cannot control it.  She also has trouble speaking and will start stuttering.  She also reports generalized fatigue and weakness.  She felt lightheaded and nauseous.  She was seen in the ED on 07/13/2023.  Labs including CBC, BMP, hepatic panel, UA and respiratory panel, were unremarkable.  She followed up with her PCP who ordered a repeat MRI of the brain without contrast performed on 07/27/2023 which again revealed the meningioma along the inferior lateral margin of the right cerebellar hemisphere, comparable in size to the previous imaging in Dec 2024 (14.7 x 5.7 x 8.0 mm) but no acute intracranial abnormality.  Imaging  not available to me to compare.  Her husband notes that she doesn't shake in her sleep.  For the past several months, she has been experiencing nightmares almost nightly.  Sometimes, she will be sleeping on the couch and suddenly awake and scream but then fall back asleep.  She has anxiety and depression but denies any new or worsening stressors.  No new medications.    Memory: She had neuropsychological evaluation on 06/21/2023.  Aside from a few isolated low scores that did not cluster to any single domain, she did not show evidence of a neurocognitive disorder.  Her subjective cognitive concerns felt most likely to be related to cerebrovascular changes, chronic pain, anxiety and situational stressors.  Current NSAIDS:  ASA 81mg  daily Current analgesics:  Tylenol  Current triptans:  no Current anti-emetic:  no Current muscle relaxants:  no Current anti-anxiolytic:  none Current sleep aide:  no Current Antihypertensive medications:  atenolol  25mg  Current Antidepressant medications:  citalopram  40mg  Current Anticonvulsant medications:  no Current Vitamins/Herbal/Supplements:  no Current Antihistamines/Decongestants:  no Other therapy:  no Prednisone  and Plaquenil for RA   Caffeine:  no Alcohol:  no Smoker:  Quit 34 years ago but started again due to recent tragedy Depression:  Significant depression and anxiety.   HISTORY: Migraines: She has been evaluated by several neurologists and headache specialists, such as Dr. Manus Sellers, Dr. Gunnar Leff, Dr. Arno Bibles, Dr. Garey Jung, and Dr. Norwood Beets. Occurs spontaneously, including while driving. Onset:  Has history of typical migraines (with headache, nausea and vomiting) since age 41.  This subsided and started to have these current spells starting in her early 78s.  70% of the time  it is accompanied by headache. Location:  holocephalic Quality:  Non-throbbing pressure Initial intensity:  5/10 Aura:  no Prodrome:   Indescribable "foreboding" feeling Associated symptoms:  Migraines typically present as TIA-like symptoms, including right-sided weakness involving face, arm and leg, photophobia, word-finding difficulties, difficulty getting words out, slurred speech, staggering gait.  She is conscious and alert but doesn't feel like she can function during the spell.  Following episodes, she is fatigued and will sleep for 2-3 hours.  On rare occasions, accompanied by visual field cut. Initial duration:  30 minutes Initial frequency:  Varies.  Usually once a month but may be 3 times a week. Triggers:  Chemical smells, some perfumes, bright lights Relieving factors:  Laying down Activity:  Cannot function   MRI/MRA of head from 06/15/08 were unremarkable. EEGs from 11/02/12 and 09/27/13 were normal.   Past abortive therapy:  none Past preventative therapy:  Amitriptyline  (initially for complex regional pain syndrome), venlafaxine  (racing thoughts), Sinequan, onriboflavin, Cymbalta , gabapentin    Family history of headache:  Maternal grandmother, sister   Neuropathy: She reports numbness, tingling and burning in the feet.  No weakness or radicular back pain.  Sed rate mildly elevated at 42.  ANA, CRP, SPEP/IFE, TSH and B12 (610) were unremarkable.  She has rheumatoid arthritis.  She has lumbar spinal stenosis (L4-5).  At first, it was thought to be secondary to her hip.  If she tries to walk more than 15 feet, she is in pain.  She has left sided hip pain down the leg.  She had two epidurals which were ineffective. Failed physical therapy.  Told she is not a candidate for surgery.    Tremor: Started noticing tremors in 2023.  She notices it with use, particularly when holding objects such as writing, holding utensils.  It shakes when she is holding the bible up at church.  Not at rest.  Notices it slightly in the left hand.  She has complex regional pain syndrome in the right hand.  It has progressively gotten worse.   Balance is poor.  She has pain an discoloration in the toes.  She has spinal stenosis in the lumbar spine.  No family history of tremor.     Visual Disturbance: In June 2020, she had episode of sensation that eyes are crossing but for 10-15 seconds but denies actually seeing double vision or blurred vision, occurring once or twice a day and may go several days before it occurs again.  No associated headache.  Sometimes she thinks she sees something in her peripheral vision that isn't there.  One time, she thought she saw a fire on the burner which isn't there..  She also notes uncontrolled twitching of the legs or upper part of her body, lasting a few seconds.  She also has been feeling lightheaded.  This started prior to starting prednisone  and Plaquenil.  MRI of brain without contrast was performed on 10/20/2018 demonstrated moderate chronic small vessel ischemic changes in the cerebral white matter and pons.  Due to endorsing myoclonus, MRI of cervical spine was also performed and showed noncompressive degenerative changes but normal cervical spinal cord. She had a formal eye exam which was unremarkable.  No longer having these symptoms.  Unclear if it may have been related to prednisone .  Memory deficits: She is concerned about her memory.  She reports word-finding issues, trouble remembering names.  May forget what she ate for breakfast that morning.    She also is forgetting some recipes.  She  sometimes has trouble remembering how to drive to familiar places but will get it after a couple of minutes.  Still independent.  Able to pay bills without difficulty.No family history of Alzheimer's but she said her mother had cognitive difficulties after taking prednisone .  Thyroid  level was normal.  She does suffer from emotional stress and chronic back pain.  12/14/2022 LABS:  B12 276 and TSH 1.79.  Started supplement.  Advised to start B12 1000mcg daily but no change. MRI of brain with and without contrast on  02/20/2023 showed moderate-severe chronic small vessel ischemic changes as well as 2 cm posterior fossa meningioma slightly indenting the cerebellum without associated brain edema.   PAST MEDICAL HISTORY: Past Medical History:  Diagnosis Date   Anxiety    Depression    GERD (gastroesophageal reflux disease)    History of chicken pox    Hyperlipidemia    Migraine    complicated migraines with transient R side facial paralysis and aphasia   Migraine headache with aura    hemiplegic migraine   MVP (mitral valve prolapse)    Osteopenia    Reflex sympathetic dystrophy, unspecified 2011   R hand related to wrist fx, improved s/p nerve blocks   Seizures (HCC)    Stevens-Johnson disease (HCC)     MEDICATIONS: Current Outpatient Medications on File Prior to Visit  Medication Sig Dispense Refill   Acetaminophen  (TYLENOL  8 HOUR PO) Take by mouth daily.     atenolol  (TENORMIN ) 25 MG tablet TAKE 1 TABLET BY MOUTH EVERY DAY (Patient taking differently: Take 25 mg by mouth 2 (two) times daily. 1/2 tab in the morning, and 1/2 tab at night) 90 tablet 2   citalopram  (CELEXA ) 40 MG tablet TAKE 1 TABLET BY MOUTH EVERY DAY 90 tablet 0   cyanocobalamin  (VITAMIN B12) 500 MCG tablet Take 500 mcg by mouth daily.     esomeprazole  (NEXIUM ) 20 MG capsule Take 1 capsule (20 mg total) by mouth daily before breakfast. OVC     magnesium  30 MG tablet Take 30 mg by mouth 2 (two) times daily.     Multiple Vitamins-Minerals (VITAMIN D3 COMPLETE PO) Take by mouth daily.     Turmeric (QC TUMERIC COMPLEX PO) Take by mouth.     No current facility-administered medications on file prior to visit.    ALLERGIES: Allergies  Allergen Reactions   Cephalexin     REACTION: Derrel Flies   Epinephrine     REACTION: hypertensive reaction   Erythromycin     REACTION: Rash   Lactated Ringers     REACTION: cardiac symptoms   Nortriptyline      Rapid heart rate   Penicillins     REACTION: Rash    Sulfamethoxazole-Trimethoprim     REACTION: Derrel Flies   Sulfonamide Derivatives    Trimethoprim     REACTION: stevens johnson syndrome   Wellbutrin  [Bupropion ] Other (See Comments)    tremors    FAMILY HISTORY: Family History  Problem Relation Age of Onset   Leukemia Brother        CLL   Cancer Brother    Arthritis Mother    Arthritis Father    Breast cancer Sister    Cancer Sister    Arthritis Other    Ovarian cancer Other    Hyperlipidemia Other    Hypertension Other    Stroke Other    Colon cancer Neg Hx       Objective:  Blood pressure 120/68, pulse 100, weight 137  lb (62.1 kg), SpO2 96%. General: Emotional.  Patient appears well-groomed.   Head:  Normocephalic/atraumatic Neck:  Supple.  No paraspinal tenderness.  Full range of motion. Heart:  Regular rate and rhythm. Neuro:  Alert and oriented.  Speech fluent and not dysarthric but with intermittent stuttering.  Language intact.  CN II-XII intact.  Bulk and tone normal.  Inconsistent and arrhythmic shaking of head and tremor of extremities that may suppress when distracted.  No rigidity.  Muscle strength 5/5 throughout.  Sensation to pinprick and vibration intact.  Deep tendon reflexes 2+ throughout, toes downgoing.  Cautious gait with exaggerated swaying of torso and lower extremities without losing balance.      Janne Members, DO   CC:  Parnell Bologna, MD

## 2023-07-30 NOTE — ED Notes (Signed)
 Discharge paperwork given and verbally understood.

## 2023-07-30 NOTE — ED Triage Notes (Signed)
 Pt caox4 c/o increased consistency in tremor x2 wks. Dx with meningioma in 2024 and states she is concerned it has worsened. Recent MRI done with PCP. Pt states she was prescribed xanax with minimal relief.

## 2023-07-30 NOTE — Discharge Instructions (Signed)
 You have been seen and discharged from the emergency department.  Your blood work and electrolytes were normal.  It is possible that the tremors you are having are separate issue from the meningioma.  This is a question to ask neurology.  Avoid caffeine, try to get good sleep. There is further workup to be done outpatient by neurology.  Follow-up with your primary provider for further evaluation and further care. Take home medications as prescribed. If you have any worsening symptoms or further concerns for your health please return to an emergency department for further evaluation.

## 2023-07-30 NOTE — ED Provider Notes (Signed)
 [place] EMERGENCY DEPARTMENT AT [facility] Provider Note   CSN: [id number] Arrival date & time: 07/30/23  1224     History  Chief Complaint  Patient presents with   Tremors    Lacey Salas is a 78 y.o. female.  HPI   78 year old female presents the emergency department with worsening ongoing tremor.  Patient follows with Dr. Festus Hubert for outpatient neurology.  This is when a meningioma in the posterior fossa was found.  Since this diagnosis she has been having increasing tremors.  They are whole body tremors, worse with intention, relieved during sleep, usually not present at rest.  They do sometimes affect her speech and ambulation but she has no other focal neurologic complaint.  Home Medications Prior to Admission medications   Medication Sig Start Date End Date Taking? Authorizing Provider  Acetaminophen  (TYLENOL  8 HOUR PO) Take by mouth daily.    [provider]  atenolol  (TENORMIN ) 25 MG tablet TAKE 1 TABLET BY MOUTH EVERY DAY Patient taking differently: Take 25 mg by mouth 2 (two) times daily. 1/2 tab in the morning, and 1/2 tab at night 10/22/20   Hilts, Michael, MD  citalopram  (CELEXA ) 40 MG tablet TAKE 1 TABLET BY MOUTH EVERY DAY 05/26/23   Festus Hubert, Adam R, DO  cyanocobalamin  (VITAMIN B12) 500 MCG tablet Take 500 mcg by mouth daily.    [provider]  esomeprazole  (NEXIUM ) 20 MG capsule Take 1 capsule (20 mg total) by mouth daily before breakfast. OVC 09/22/12   Ferrel Hsu, NP  magnesium  30 MG tablet Take 30 mg by mouth 2 (two) times daily.    [provider]  Multiple Vitamins-Minerals (VITAMIN D3 COMPLETE PO) Take by mouth daily.    [provider]  Turmeric (QC TUMERIC COMPLEX PO) Take by mouth.    [provider]      Allergies    Cephalexin, Epinephrine, Erythromycin, Lactated ringers, Nortriptyline , Penicillins, Sulfamethoxazole-trimethoprim, Sulfonamide derivatives, Trimethoprim, and Wellbutrin  [bupropion ]     Review of Systems   Review of Systems  Constitutional:  Negative for fever.  Respiratory:  Negative for shortness of breath.   Cardiovascular:  Negative for chest pain.  Gastrointestinal:  Negative for abdominal pain, diarrhea and vomiting.  Skin:  Negative for rash.  Neurological:  Positive for tremors. Negative for seizures, syncope, facial asymmetry, numbness and headaches.    Physical Exam Updated Vital Signs BP 137/63 (BP Location: Right Arm)   Pulse 63   Temp 98.2 F (36.8 C) (Oral)   Resp 18   Ht 5\' 3"  (1.6 m)   Wt 62.7 kg   SpO2 100%   BMI 24.50 kg/m  Physical Exam Vitals and nursing note reviewed.  Constitutional:      Appearance: Normal appearance.  HENT:     Head: Normocephalic.     Mouth/Throat:     Mouth: Mucous membranes are moist.  Cardiovascular:     Rate and Rhythm: Normal rate.  Pulmonary:     Effort: Pulmonary effort is normal. No respiratory distress.  Abdominal:     Palpations: Abdomen is soft.     Tenderness: There is no abdominal tenderness.  Skin:    General: Skin is warm.  Neurological:     Mental Status: She is alert and oriented to person, place, and time.     Comments: What appears to be essential versus intention tremor on exam, some postural components that sometimes affects her speech and gait but she has no focal neurologic deficit  Psychiatric:  Mood and Affect: Mood normal.     ED Results / Procedures / Treatments   Labs (all labs ordered are listed, but only abnormal results are displayed) Labs Reviewed  BASIC METABOLIC PANEL WITH GFR - Abnormal; Notable for the following components:      Result Value   CO2 21 (*)    All other components within normal limits  CBC WITH DIFFERENTIAL/PLATELET  MAGNESIUM     EKG None  Radiology No results found.  Procedures Procedures    Medications Ordered in ED Medications - No data to display  ED Course/ Medical Decision Making/ A&P                                  Medical Decision Making Amount and/or Complexity of Data Reviewed Labs: ordered.   78 year old female presents emergency department with tremors that are worsening over the last 2 weeks.  She does have history of a small meningioma in the posterior fossa, being followed by outpatient neurology with Dr. Festus Hubert.  Here asking for relief of tremors.  No focal neurologic deficit on exam.  Vitals are normal and stable.  Blood work is reassuring with no acute electrolyte abnormality.  Consulted with neurology, Dr. Lindzen.  She is already on Xanax and a low-dose beta-blocker.  Outside of this he recommends dietary modifications and caffeine avoidance.  But otherwise we do not feel that her symptoms would be related to the previous finding of a meningioma.  We do not feel she warrants emergent repeat MRI or admission at this time.  She has a follow-up appointment with her outpatient neurologist next week and I discussed with her the process of addressing her complaints with him.  Patient at this time appears safe and stable for discharge and close outpatient follow up. Discharge plan and strict return to ED precautions discussed, patient verbalizes understanding and agreement.        Final Clinical Impression(s) / ED Diagnoses Final diagnoses:  Tremor    Rx / DC Orders ED Discharge Orders     None         Flonnie Humphrey, DO 07/30/23 1555

## 2023-08-02 ENCOUNTER — Ambulatory Visit: Admitting: Neurology

## 2023-08-02 ENCOUNTER — Encounter: Payer: Self-pay | Admitting: Neurology

## 2023-08-02 VITALS — BP 120/68 | HR 100 | Wt 137.0 lb

## 2023-08-02 DIAGNOSIS — G43409 Hemiplegic migraine, not intractable, without status migrainosus: Secondary | ICD-10-CM

## 2023-08-02 DIAGNOSIS — D329 Benign neoplasm of meninges, unspecified: Secondary | ICD-10-CM | POA: Diagnosis not present

## 2023-08-02 DIAGNOSIS — R4189 Other symptoms and signs involving cognitive functions and awareness: Secondary | ICD-10-CM

## 2023-08-02 DIAGNOSIS — F444 Conversion disorder with motor symptom or deficit: Secondary | ICD-10-CM | POA: Diagnosis not present

## 2023-08-02 DIAGNOSIS — F418 Other specified anxiety disorders: Secondary | ICD-10-CM

## 2023-08-02 NOTE — Patient Instructions (Addendum)
 I think the tremors are related to anxiety.  We need to get you with a psychiatrist so that this can be appropriately treated.    Would repeat MRI of brain with and without contrast in one year to follow up on meningioma (not cause of your symptoms)  Follow up with me in 6 months.

## 2023-08-03 ENCOUNTER — Telehealth: Payer: Self-pay | Admitting: Neurology

## 2023-08-03 DIAGNOSIS — F444 Conversion disorder with motor symptom or deficit: Secondary | ICD-10-CM

## 2023-08-03 NOTE — Telephone Encounter (Signed)
 Pt called in stating someone wrote down 2 phone numbers for her referral to [facility] and they are both wrong. She would like the correct number.

## 2023-08-03 NOTE — Telephone Encounter (Signed)
 Advised patient of correct phone number [phone].  Per patient she wanted to let Dr.Jaffe know her PCP sent in Depakote to the pharmacy to see if that would help.     Per patient please advise Dr.Jaffe she can not take much more of this shaking. " She feels hopeless"

## 2023-08-03 NOTE — Telephone Encounter (Signed)
 Order re added.

## 2023-08-03 NOTE — Telephone Encounter (Signed)
 Patient spoke with Dr Herby Lolling  office and they do not a have a referral for patient. She really needs to get in to see someone please resend the referral to them.

## 2023-08-05 ENCOUNTER — Telehealth: Payer: Self-pay | Admitting: Neurology

## 2023-08-05 NOTE — Telephone Encounter (Signed)
 The patient states she was supposed to have a referral sent somewhere for her shaking but she has not heard back from anyone. She doesn't know what else she can do but she needs some help for this

## 2023-08-05 NOTE — Telephone Encounter (Signed)
 Patient has to wait for the Referral to approve the referral.

## 2023-08-06 ENCOUNTER — Ambulatory Visit: Admitting: Neurology

## 2023-08-07 ENCOUNTER — Ambulatory Visit (HOSPITAL_COMMUNITY)
Admission: EM | Admit: 2023-08-07 | Discharge: 2023-08-07 | Disposition: A | Discharge status: Home / Self Care | Attending: Nurse Practitioner | Admitting: Nurse Practitioner

## 2023-08-07 DIAGNOSIS — F411 Generalized anxiety disorder: Secondary | ICD-10-CM | POA: Diagnosis present

## 2023-08-07 DIAGNOSIS — G250 Essential tremor: Secondary | ICD-10-CM | POA: Insufficient documentation

## 2023-08-07 MED ORDER — MAGNESIUM 30 MG PO TABS: 30.0000 mg | ORAL_TABLET | Freq: Every day | ORAL | Status: DC

## 2023-08-07 MED ORDER — NICOTINE 14 MG/24HR TD PT24: 14.0000 mg | MEDICATED_PATCH | TRANSDERMAL | 0 refills | Status: DC

## 2023-08-07 MED ORDER — PROPRANOLOL HCL 10 MG PO TABS: 10.0000 mg | ORAL_TABLET | Freq: Two times a day (BID) | ORAL | Status: DC

## 2023-08-07 MED ORDER — PROPRANOLOL HCL 10 MG PO TABS: 10.0000 mg | ORAL_TABLET | Freq: Two times a day (BID) | ORAL | 0 refills | Status: DC

## 2023-08-07 NOTE — Progress Notes (Signed)
   08/07/23 1142  [facility] Triage Screening (Walk-ins at [facility] only)  How Did You Hear About Us ? Family/Friend  What Is the Reason for Your Visit/Call Today? Antonucci is a 78 year old female presenting to [facility] accompanied by her husband. Pt reports she has severe trimmers and has not been able to sleep or eat for a few nights. Pt also mentions she is severely weak at this week. Pt was also at [facility] a month ago and was referred to come here. PT states, "I am feeling hopeless and unable to do the things I want to do". Pt reports she is looking for any type of help available. Pt denies substance use, Si, HI and AVH.  How Long Has This Been Causing You Problems? <Week  Have You Recently Had Any Thoughts About Hurting Yourself? No  Are You Planning to Commit Suicide/Harm Yourself At This time? No  Have you Recently Had Thoughts About Hurting Someone Marigene Shoulder? No  Are You Planning To Harm Someone At This Time? No  Physical Abuse Denies  Verbal Abuse Denies  Sexual Abuse Denies  Exploitation of patient/patient's resources Denies  Self-Neglect Denies  Possible abuse reported to: Other (Comment)  Are you currently experiencing any auditory, visual or other hallucinations? No  Have You Used Any Alcohol or Drugs in the Past 24 Hours? No  Do you have any current medical co-morbidities that require immediate attention? No  Clinician description of patient physical appearance/behavior: shaky, cooperative, anxious  What Do You Feel Would Help You the Most Today? Medication(s)  If access to [facility] was not available, would you have sought care in the Emergency Department? No  Determination of Need Routine (7 days)  Options For Referral Medication Management

## 2023-08-07 NOTE — ED Provider Notes (Cosign Needed Addendum)
 Behavioral Health Urgent Care Medical Screening Exam  Patient Name: Lacey Salas MRN: [id number] Date of Evaluation: 08/07/23 Chief Complaint:  Worsening tremors with anxiety Diagnosis:  Final diagnoses:  GAD (generalized anxiety disorder)   History of Present illness: Lacey Salas is a 78 y.o. Caucasian female with a prior reported mental health history of MDD, GAD, who presents to the [facility], for worsening depressive symptoms, as well as for anxiety.  Assessment: Patient is accompanied today [facility] by her husband, who does not take prior and assessment.  Patient reports that over the past at least 1 month, she has been experiencing trouble sleeping, anhedonia; reports trouble watching TV, which she typically enjoys.  She reports poor motivation levels when doing her unable to get out of bed in the mornings, reports "I am a worrier", reports worrying most of the feeling on edge, with muscle tension, reports that symptoms have been ongoing for at least the past month, worsening over the past few days.  She reports a poor appetite, and shares that she has lost 10 pounds over the past month.  Patient reports that her symptoms first began in November of last year, reports that she was told that she has a meningioma, but the back of her brain that her neurologist, also had to extractions of her teeth at the time.  Reports being worried about some of her friends children, "who are not doing good", reports that her friend had breast cancer, which also bothered her.  Patient reports having bad dreams about the people who have passed in her life, asking for help.  She reports multiple losses over the past 10 years, including her parents, her sister, a dog.  Reports that husband is alive 10 years ago, but returned a few years ago, and is very supportive.  Reports that she has 2 children, 1 in [place] , one in [place] , as well as 4 grandchildren, who  are also very supportive.  Patient denies any suicide attempts in the past, reports one mental health related hospitalization in the past, patient shares that it was after her husband left her >10 yrs ago & she had passive SI.  Patient denies having mental health services on the outpatient at this time, denies having a therapist.  Reports current medications as Depakote 500 mg daily in the morning started within the last 5 days.  States that she takes citalopram  40 mg x 30 years, as well as atenolol  12.5 mg twice daily x 30 years, states that this medication was prescribed for tachycardia and anxiety.  Reports that her medications are prescribed by her PCP, reports trials of Xanax last week, and it was effective for management of her anxiety, and her outpatient provider stopped this medication.  Patient denies substance use, denies alcohol use, reports that the only thing that she uses at this time is nicotine, reports willingness to stop using nicotine, we talked about the fact that the nicotine use might be worsening her anxiety, and talked about a nicotine patch for smoking cessation to which pt is receptive. Nicotine transdermal patch daily x 7 days sent to her pharmacy, pt educated on the importance of not smoking with patch on, and to quit smoking entirely with initiation of the patch.  We discussed medication adjustments to more adequately target pt's anxiety including the following; start Depakote 500 mg daily, as has only been taking x 5 days, states that medication is not yielding any benefits.  We also discussed  stopping atenolol , and starting propranolol 10 mg twice daily to better manage anxiety as well as tremors.  We talked about starting Seroquel 25 mg nightly, but given the possible side effects of antipsychotic medications, we will defer this medication for the outpatient provider to consider starting nightly for sleep if the Inderal is not helping with anxiety and the night dose helping  with sleep.    We discussed outpatient behavioral health management for both medications and therapy; educated patient on the open access clinic, and on the need to present back to this location on Monday, 05/26 for medication management and therapy establishment of care. We discussed for pt to present to the second floor of this location, take elevators to the second floor, pick a clip board and complete it and wait to be called.   Education provided on the fact that if experiencing worsening of psychiatry symptoms including suicidal ideations, homicidal ideations, or having auditory/visual hallucinations, etc, to call 911, [phone], come back to this location, or go to the nearest ER. Pt verbalized understanding.   Prescribed 7 days worth of the nicotine patch 14 mg and Inderal 10 mg BID for essential tremors and GAD. Educated on the need to return to open access for f/u. Pt with flat affect and depressed mood, attention to personal hygiene and grooming is fair, eye contact is good, speech is clear & coherent. Thought contents are organized and logical, and pt currently denies SI/HI/AVH or paranoia. There is no evidence of delusional thoughts.  Denies first rank symptoms and there are no overt signs of psychosis. Left facility in no acute distress. Education on benefits, rationales and possible side effects of medications explained, agreeable to trials.  Suicide Risk Assessment: Minimal: No identifiable suicidal ideation.  Patients presenting with no risk factors but with morbid ruminations; may be classified as minimal risk based on the severity of the depressive symptoms   Flowsheet Row ED from 08/07/2023 in [facility] ED from 07/13/2023 in [facility] Emergency Department at [facility] ED from 08/02/2021 in [facility] Emergency Department at [facility]  C-SSRS RISK CATEGORY No Risk No Risk No Risk       Psychiatric Specialty Exam  Presentation   General Appearance:Fairly Groomed  Eye Contact:Fair  Speech:Clear and Coherent  Speech Volume:Normal  Handedness:Right   Mood and Affect  Mood:Depressed; Anxious  Affect:Congruent   Thought Process  Thought Processes:Coherent  Descriptions of Associations:Intact  Orientation:Full (Time, Place and Person)  Thought Content:Logical    Hallucinations:None  Ideas of Reference:None  Suicidal Thoughts:No  Homicidal Thoughts:No   Sensorium  Memory:Immediate Good  Judgment:Good  Insight:Good   Executive Functions  Concentration:Fair  Attention Span:Good  Recall:Fair  Fund of Knowledge:Good  Language:Good   Psychomotor Activity  Psychomotor Activity:Normal   Assets  Assets:Resilience; Social Support   Sleep  Sleep:Poor  Number of hours: No data recorded  Physical Exam: Physical Exam Vitals and nursing note reviewed.  HENT:     Head: Normocephalic.  Neurological:     General: No focal deficit present.     Mental Status: She is oriented to person, place, and time.  Psychiatric:        Mood and Affect: Mood normal.        Behavior: Behavior normal.        Thought Content: Thought content normal.        Judgment: Judgment normal.    Review of Systems  Psychiatric/Behavioral:  Positive for depression. Negative for  hallucinations, memory loss, substance abuse and suicidal ideas. The patient is nervous/anxious and has insomnia.   All other systems reviewed and are negative.  Blood pressure 114/71, pulse 72, temperature 97.9 F (36.6 C), temperature source Oral, resp. rate 19, SpO2 99%. There is no height or weight on file to calculate BMI.  Musculoskeletal: Strength & Muscle Tone: within normal limits Gait & Station: normal Patient leans: N/A   [facility] MSE Discharge Disposition for Follow up and Recommendations: Based on my evaluation the patient does not appear to have an emergency medical condition and can be discharged with resources  and follow up care in outpatient services for Medication Management and Individual Therapy  Follow up with [facility] Residents Only  Walk-in hours for open access (medication management and therapy) are Monday - Friday 8 am to 11 am. Appointments are limited, so please arrive at 0645 am. Upon arrival, please complete the form on the clipboard located at the front desk. If there are no clipboards available, all appointments have been filled for that day.  [facility] [address] [place]  [address] [phone]  Robet Chiquito, NP 08/07/2023, 3:10 PM

## 2023-08-07 NOTE — Discharge Instructions (Addendum)
 Follow up with [facility] Residents Only  Walk-in hours for open access (medication management and therapy) are Monday - Friday 8 am to 11 am. Appointments are limited, so please arrive at 06:45am. Upon arrival, please complete the form on the clipboard located at the front desk. If there are no clipboards available, all appointments have been filled for that day.  [facility] [address] [place]  [address] [phone]  Stop taking Depakote. Education provided on the fact that if experiencing worsening of psychiatry symptoms including suicidal ideations, homicidal ideations, or having auditory/visual hallucinations, etc, to call 911, [phone], come back to this location, or go to the nearest ER. Pt verbalized understanding.

## 2023-08-12 DIAGNOSIS — R251 Tremor, unspecified: Secondary | ICD-10-CM | POA: Diagnosis not present

## 2023-08-12 DIAGNOSIS — R110 Nausea: Secondary | ICD-10-CM | POA: Diagnosis not present

## 2023-08-24 ENCOUNTER — Other Ambulatory Visit: Payer: Self-pay | Admitting: Neurology

## 2023-08-27 DIAGNOSIS — M2559 Pain in other specified joint: Secondary | ICD-10-CM | POA: Diagnosis not present

## 2023-08-27 DIAGNOSIS — M797 Fibromyalgia: Secondary | ICD-10-CM | POA: Diagnosis not present

## 2023-08-27 DIAGNOSIS — Z6823 Body mass index (BMI) 23.0-23.9, adult: Secondary | ICD-10-CM | POA: Diagnosis not present

## 2023-08-27 DIAGNOSIS — L409 Psoriasis, unspecified: Secondary | ICD-10-CM | POA: Diagnosis not present

## 2023-08-27 DIAGNOSIS — D329 Benign neoplasm of meninges, unspecified: Secondary | ICD-10-CM | POA: Diagnosis not present

## 2023-08-27 DIAGNOSIS — M1991 Primary osteoarthritis, unspecified site: Secondary | ICD-10-CM | POA: Diagnosis not present

## 2023-08-27 DIAGNOSIS — R768 Other specified abnormal immunological findings in serum: Secondary | ICD-10-CM | POA: Diagnosis not present

## 2023-08-27 DIAGNOSIS — M5136 Other intervertebral disc degeneration, lumbar region with discogenic back pain only: Secondary | ICD-10-CM | POA: Diagnosis not present

## 2023-09-20 DIAGNOSIS — R251 Tremor, unspecified: Secondary | ICD-10-CM | POA: Diagnosis not present

## 2023-09-20 DIAGNOSIS — D329 Benign neoplasm of meninges, unspecified: Secondary | ICD-10-CM | POA: Diagnosis not present

## 2023-09-27 DIAGNOSIS — D329 Benign neoplasm of meninges, unspecified: Secondary | ICD-10-CM | POA: Diagnosis not present

## 2023-10-04 DIAGNOSIS — R251 Tremor, unspecified: Secondary | ICD-10-CM | POA: Diagnosis not present

## 2023-10-04 DIAGNOSIS — K219 Gastro-esophageal reflux disease without esophagitis: Secondary | ICD-10-CM | POA: Diagnosis not present

## 2023-10-15 DIAGNOSIS — M545 Low back pain, unspecified: Secondary | ICD-10-CM | POA: Diagnosis not present

## 2023-10-27 DIAGNOSIS — K219 Gastro-esophageal reflux disease without esophagitis: Secondary | ICD-10-CM | POA: Diagnosis not present

## 2023-10-27 DIAGNOSIS — F17200 Nicotine dependence, unspecified, uncomplicated: Secondary | ICD-10-CM | POA: Diagnosis not present

## 2023-10-27 DIAGNOSIS — M797 Fibromyalgia: Secondary | ICD-10-CM | POA: Diagnosis not present

## 2023-11-04 DIAGNOSIS — H52203 Unspecified astigmatism, bilateral: Secondary | ICD-10-CM | POA: Diagnosis not present

## 2023-11-04 DIAGNOSIS — H04123 Dry eye syndrome of bilateral lacrimal glands: Secondary | ICD-10-CM | POA: Diagnosis not present

## 2023-11-04 DIAGNOSIS — H16101 Unspecified superficial keratitis, right eye: Secondary | ICD-10-CM | POA: Diagnosis not present

## 2023-11-11 DIAGNOSIS — S39012D Strain of muscle, fascia and tendon of lower back, subsequent encounter: Secondary | ICD-10-CM | POA: Diagnosis not present

## 2023-11-11 DIAGNOSIS — M25652 Stiffness of left hip, not elsewhere classified: Secondary | ICD-10-CM | POA: Diagnosis not present

## 2023-11-11 DIAGNOSIS — R262 Difficulty in walking, not elsewhere classified: Secondary | ICD-10-CM | POA: Diagnosis not present

## 2023-11-24 ENCOUNTER — Institutional Professional Consult (permissible substitution): Payer: Medicare Other | Admitting: Psychology

## 2023-11-24 ENCOUNTER — Ambulatory Visit: Payer: Medicare Other

## 2023-12-01 ENCOUNTER — Encounter: Payer: Medicare Other | Admitting: Psychology

## 2023-12-27 ENCOUNTER — Ambulatory Visit: Admitting: Neurology

## 2024-02-04 NOTE — Progress Notes (Signed)
 Subjective   Patient ID:  Lacey Salas is a 78 y.o. (DOB [birth year]) female.      Patient presents with   Follow-up    Follow up medication not working. 2 pills was to much, it makes her fall.  She is taken 1. Only wants to eat at night. She is very weak.     She continues to report feelings of sadness and negative thinking that seem to get remarkably better each evening. She is taking xanax BID which is helping with anxiety. She is sleeping well on olanzapine and has no appetite until she takes it each night. She worries that her depressed condition will be obvious to her children around the holidays  Short Social History[1] Family History  Problem Relation Name Age of Onset   Arthritis Father      Mother          rheumatoid   Cancer Brother          leukemia    Sister          breast   Multiple myeloma Brother     Pneumonia Father     Past Medical History:  Diagnosis Date   Fibromyalgia    GERD (gastroesophageal reflux disease)    Migraine    Osteoarthritis    Spinal stenosis at L4-L5 level    Review of Systems is complete and negative except as noted.  Objective   BP 133/66 (BP Location: Right Upper Arm, Patient Position: Sitting)   Pulse 93   Temp 97.7 F (36.5 C) (Oral)   Resp 16   Ht 5' 3 (1.6 m)   Wt 135 lb 6.4 oz (61.4 kg)   SpO2 97%   BMI 23.99 kg/m  General:  Well developed, Well nourished, No distress HEENT: Normocephalic, atraumatic;  Neck:  Supple  Skin:  No focal rashes  Extremities:  No clubbing, cyanosis or edema.   Neuro:  No focal deficits; Cranial nerves II-XII intact. Mild diffuse tremor at rest Psych. Alert. Good eye contact. Flattened affect. Denies s/h I/p    Impression   1. Mood disorder     Plan  Continue meds' Increase xanax. She can do 1mg  bid or 1/2mg  tid Consider changing olanzapine to abilify Recheck 4 weeks   Patient's Medications       * Accurate as of February 04, 2024 11:59 PM. Reflects encounter med  changes as of last refresh          Continued Medications      Instructions  acetaminophen  650 MG CR tablet Commonly known as: TYLENOL  ARTHRITIS,MAPAP  Daily   atenolol  25 mg tablet Commonly known as: TENORMIN   25 mg, Oral, Daily   cyanocobalamin  500 MCG tablet  500 mcg, Daily   DULoxetine  HCl 30 mg capsule Commonly known as: CYMBALTA  Start taking on: January 17, 2024  Take one capsule (30 mg dose) by mouth daily for 7 days, THEN two capsules (60 mg dose) daily for 23 days.   esomeprazole  magnesium  40 mg capsule Commonly known as: NEXIUM   40 mg, As needed   LORAzepam  0.5 mg tablet Commonly known as: ATIVAN   0.5 mg, Oral, Daily as needed   magnesium  30 MG tablet  30 mg, 2 times a day   * OLANZapine 10 mg tablet Commonly known as: ZYPREXA  10 mg, Oral, At bedtime   * OLANZapine 5 mg tablet Commonly known as: ZYPREXA  5 mg, Oral, Every morning      * * This  list has 2 medication(s) that are the same as other medications prescribed for you. Read the directions carefully, and ask your doctor or other care provider to review them with you.           No orders of the defined types were placed in this encounter.   Risks, benefits, and alternatives of the medications and treatment plan prescribed today were discussed, and patient expressed understanding. Plan follow-up as discussed or as needed if any worsening symptoms or change in condition.             [1] Social History Tobacco Use   Smoking status: Every Day    Current packs/day: 0.50    Average packs/day: 0.5 packs/day for 41.9 years (20.9 ttl pk-yrs)    Types: Cigarettes    Start date: 03/16/1982    Passive exposure: Current   Smokeless tobacco: Never  Vaping Use   Vaping status: Never Used  Substance Use Topics   Alcohol use: Never   Drug use: Never  *Some images could not be shown.

## 2024-02-22 NOTE — Progress Notes (Signed)
 Subjective   Patient ID:  Lacey Salas is a 78 y.o. (DOB March 14, 1946) female.      Patient presents with   Dizziness   Fatigue   Depression    Pt presents today for depression, dizziness and fatigue.     Continues to feel very depressed and very anxious Anxiety better with lorazepam  but trying to take it sparingly Sleeping well on olanzapine No appetite No interest of pleasure in doing things Short Social History[1] Family History[2] Past Medical History:  Diagnosis Date   Fibromyalgia    GERD (gastroesophageal reflux disease)    Migraine    Osteoarthritis    Spinal stenosis at L4-L5 level    Review of Systems is complete and negative except as noted.  Objective   BP 129/72 (BP Location: Left Upper Arm, Patient Position: Sitting)   Pulse 96   Temp 98.6 F (37 C) (Oral)   Resp 16   Ht 5' 3 (1.6 m)   Wt 134 lb 6.4 oz (61 kg)   SpO2 98%   BMI 23.81 kg/m  General:  Well developed, Well nourished, No distress HEENT: Normocephalic, atraumatic;  Neck:  Supple No lymphadenopathy  Lungs:  normal effort Skin:  No focal rashes  Extremities:  No clubbing, cyanosis or edema.   Neuro:  No focal deficits; Cranial nerves II-XII intact  Psychiatric. Alert. Pleasant. Good eye contact. Psychomotor retarded. Denies s/h I/p   Impression   1. Mood disorder     Plan  Rx per after visit summary Schedule lorazepam  Continue cymbalta  Stop olanzapine Trial abilify Phone report in 3 days Psychiatry consult    Patient's Medications       * Accurate as of February 22, 2024  1:58 PM. Reflects encounter med changes as of last refresh          Continued Medications      Instructions  acetaminophen  650 MG CR tablet Commonly known as: TYLENOL  ARTHRITIS,MAPAP  Daily   atenolol  25 mg tablet Commonly known as: TENORMIN   25 mg, Oral, Daily   cyanocobalamin  500 MCG tablet  500 mcg, Daily   DULoxetine  HCl 30 mg capsule Commonly known as: CYMBALTA   30 mg, Oral,  Daily   esomeprazole  magnesium  40 mg capsule Commonly known as: NEXIUM   40 mg, As needed   LORAzepam  0.5 mg tablet Commonly known as: ATIVAN   0.5 mg, Oral, Every 6 hours as needed   magnesium  30 MG tablet  30 mg, 2 times a day   * OLANZapine 10 mg tablet Commonly known as: ZYPREXA  10 mg, Oral, At bedtime   * OLANZapine 5 mg tablet Commonly known as: ZYPREXA  5 mg, Oral, Every morning      * * This list has 2 medication(s) that are the same as other medications prescribed for you. Read the directions carefully, and ask your doctor or other care provider to review them with you.           No orders of the defined types were placed in this encounter.   Risks, benefits, and alternatives of the medications and treatment plan prescribed today were discussed, and patient expressed understanding. Plan follow-up as discussed or as needed if any worsening symptoms or change in condition.             [1] Social History Tobacco Use   Smoking status: Every Day    Current packs/day: 0.50    Average packs/day: 0.5 packs/day for 41.9 years (21.0 ttl pk-yrs)  Types: Cigarettes    Start date: 03/16/1982    Passive exposure: Current   Smokeless tobacco: Never  Vaping Use   Vaping status: Never Used  Substance Use Topics   Alcohol use: Never   Drug use: Never  [2] Family History Problem Relation Name Age of Onset   Arthritis Father      Mother          rheumatoid   Cancer Brother          leukemia    Sister          breast   Multiple myeloma Brother     Pneumonia Father    *Some images could not be shown.

## 2024-03-01 NOTE — Progress Notes (Signed)
 Subjective   Patient ID:  Lacey Salas is a 79 y.o. (DOB 12/05/1945) female.      Patient presents with   Follow-up    Follow up- she is feeling better- she would like referral to gastro and derm. No other concerns.     Doing much better with depression and anxiety Taking olanzapine and lorazepam  BID. Did not tolerate abilify Referral has been placed to psychiatry Just got back from seeing family and doing well  Has solid dysphagia. Has GERD. On ppi  Needs to see dermatologist for skin exam, dry scaly skin. Concerned about hirsutism  Short Social History[1] Family History[2] Past Medical History:  Diagnosis Date   Fibromyalgia    GERD (gastroesophageal reflux disease)    Migraine    Osteoarthritis    Spinal stenosis at L4-L5 level    Review of Systems is complete and negative except as noted.  Objective   BP 138/79 (BP Location: Left Upper Arm, Patient Position: Sitting)   Pulse 75   Temp 97.8 F (36.6 C) (Oral)   Resp 16   Ht 5' 3 (1.6 m)   Wt 133 lb 3.2 oz (60.4 kg)   SpO2 96%   BMI 23.60 kg/m  General:  Well developed, Well nourished, No distress HEENT: Normocephalic, atraumatic Neck:  Supple  CV:  S1S2, Regular rate and rhythm, without murmur, gallops or rubs Lungs:  Clear to auscultation bilaterally with normal effort Skin:  No focal rashes  Extremities:  No clubbing, cyanosis or edema.   Neuro:  very mild tremor    Impression   1. Mood disorder   2. Skin exam, screening for cancer   3. Dysphagia when swallowing solid food     Plan  Continue olanzapine and lorazepam . Doing much better. Follow up with psychiatry as scheduled Derm referral GI referral   Patient's Medications       * Accurate as of March 01, 2024 12:53 PM. Reflects encounter med changes as of last refresh          Continued Medications      Instructions  acetaminophen  650 MG CR tablet Commonly known as: TYLENOL  ARTHRITIS,MAPAP  Daily   atenolol  25 mg  tablet Commonly known as: TENORMIN   25 mg, Oral, Daily   cyanocobalamin  500 MCG tablet  500 mcg, Daily   DULoxetine  HCl 30 mg capsule Commonly known as: CYMBALTA   30 mg, Oral, Daily   esomeprazole  magnesium  40 mg capsule Commonly known as: NEXIUM   40 mg, As needed   LORAzepam  0.5 mg tablet Commonly known as: ATIVAN   0.5 mg, Oral, Every 6 hours as needed   magnesium  30 MG tablet  30 mg, 2 times a day       Discontinued Medications    ARIPiprazole 10 mg tablet Commonly known as: ABILIFY Stopped by: Lamar Cornet        Orders Placed This Encounter  Procedures   Ambulatory referral to Gastroenterology   Ambulatory referral to Dermatology    Risks, benefits, and alternatives of the medications and treatment plan prescribed today were discussed, and patient expressed understanding. Plan follow-up as discussed or as needed if any worsening symptoms or change in condition.             [1] Social History Tobacco Use   Smoking status: Every Day    Current packs/day: 0.50    Average packs/day: 0.5 packs/day for 42.0 years (21.0 ttl pk-yrs)    Types: Cigarettes    Start date: 03/16/1982  Passive exposure: Current   Smokeless tobacco: Never  Vaping Use   Vaping status: Never Used  Substance Use Topics   Alcohol use: Never   Drug use: Never  [2] Family History Problem Relation Name Age of Onset   Arthritis Father      Mother          rheumatoid   Cancer Brother          leukemia    Sister          breast   Multiple myeloma Brother     Pneumonia Father    *Some images could not be shown.

## 2024-03-13 ENCOUNTER — Ambulatory Visit (HOSPITAL_COMMUNITY)
Admission: EM | Admit: 2024-03-13 | Discharge: 2024-03-13 | Disposition: A | Discharge status: Home / Self Care | Attending: Psychiatry | Admitting: Psychiatry

## 2024-03-13 DIAGNOSIS — F3181 Bipolar II disorder: Secondary | ICD-10-CM

## 2024-03-13 DIAGNOSIS — F411 Generalized anxiety disorder: Secondary | ICD-10-CM

## 2024-03-13 MED ORDER — NICOTINE 14 MG/24HR TD PT24: 14.0000 mg | MEDICATED_PATCH | Freq: Every day | TRANSDERMAL | 0 refills | Status: AC

## 2024-03-13 MED ORDER — OLANZAPINE 5 MG PO TABS: ORAL_TABLET | ORAL | 0 refills | Status: DC

## 2024-03-13 NOTE — ED Notes (Signed)
 AVS printed and reviewed with patient. Personal belongings returned to patient at time of discharge. All question answered for patient by care team.
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
## 2024-03-13 NOTE — Discharge Instructions (Addendum)
 To summarize: I think you are suffering from Bipolar II Disorder. You are currently in what we would describe as a mixed episode with features of both depression and elevated energy.  To that end, we will STOP the duloxetine . We will INCREASE the olanzapine (from 10 mg at night to 5 mg in the morning, 10 mg at night) I recommend that we get you back into a regular office appointment schedule with the folks at our [address] office.  I would recommend you look into adding a therapist to your healthcare team, as your anxiety is significant.  I think we should hold off on changing your beta blocker (the atenolol ) for a while until we can try to resolve the current episode.  -Dr Delsie  ____ Bipolar Disorder Resources (English): There is a lot of information and misinformation about bipolar disorder on the internet and in popular culture. I encourage you to learn as much as possible about it so that you can play an active role in managing it and keeping it from controlling your life. The more you know, the more you will be in control.  Here are some reliable resources for educating yourself and your family members about bipolar disorder.  Financial Risk Analyst on Mental Illness: http://ryan-bowers.net/  [employer] of Mental Health: https://curry.com/  [facility]: transmissioncleaner.no  The Substance Abuse and Mental Health Services Administration: fooddevelopers.ch  The American Psychiatric Association: https://www.psychiatry.org/patients-families/bipolar-disorders/what-are-bipolar-disorders  Here is a QR code that links you to those resources on your phone.  Bipolar Disorder Reliable Information Sources   https://qr-codes.io/tkZBMC ___ Psychotherapy: What to Expect  If youve never done  therapy before, youre not alone--and youre not expected to know how it all works right away. The word therapy can mean many different things. There are lots of types of psychotherapy out there: some are short-term and focused on specific goals, while others are more open-ended and longer-term.  What matters most is that you feel heard and supported. A good therapist will take the time to understand your concerns, help you set clear goals, and work with you to develop a treatment plan that fits your needs--regardless of the type of therapy they practice.  Think of finding a therapist like finding the right pair of shoes. Not every pair fits the same person or situation. A good therapist should help you feel supported, safe, and ready to grow. And sometimes, just like with new shoes, it takes a few sessions to feel comfortable. But if it still doesnt feel right after a few visits, its perfectly okay to try someone else. You havent done anything wrong--this is about finding the right fit for you.  Dont give up if the first match isnt ideal. Therapy works best when you feel comfortable enough to speak openly and work through occidental petroleum bothering you.  The term therapy can mean many things. There are many different types of psychotherapy, some are short-term, others take a longer period of time. A good therapist will meet with you, discuss your goals, and help you set a treatment plan that addresses your major concerns, regardless of what type of therapy they practice. If the first therapist is not a great fit, try another one.   The American Psychological Association (the APA) has resources to learn about different kinds of therapy.  Here is a link to learn more about the different kinds of psychotherapy:   gymville.si ___ Outpatient Therapy and Psychiatry Resources for Patients: Your psychiatric needs would be well-served by consultation and regular  meetings with an outpatient therapist to assist you with your mood-related conditions. Here are a series of links for finding a therapist.    Includes links to the following: [facility] (http://wilson-mayo.com/) (only for [facility] and please reserve for uninsured) [facility] (http://blankenship-martinez.net/) Psychology Today Special Educational Needs Teacher (https://www.psychologytoday.com/us lendell) Psychology Today Support Group Tax Inspector (https://www.psychologytoday.com/us /groups/) [employer] - [employer] Location (https://carolinabehavioralcare.com/staff-location/[place]/) Mental [facility] - Support Group Finder - (recorddebt.fi) Family Services of the [employer] - Lexicographer (https://fspcares.org/contact/) The [employer] for Mental Health [place] - NAMI (https://namiguilford.org/support-and-education/support-groups/) [employer] Spatial [employer]er [facility] - Affiliated with [facility] (https://www.[place].com/lb/locations/profile/[facility]-behavioral-medicine-at-[address]/) Dept of Health and Human Services - Find a mental health facility (http://lester.info/) _____ Closing notes from your doctor: It was a pleasure taking care of you while you were with us . I want to stress once more that medications are part of, but not ALL of what we must do to take care of ourselves mentally.   We all need: - Physical activity - at least 3, but preferably 5 days per week where we exercise for 30 minutes at a level where we get out of breath. That will vary by person and health conditions, but it will improve your mental health as well as your physical health. - Good restful sleep - avoid using a phone or screens in bed, sleep in a  dark room, use a white noise machine or app and if you are having trouble sleeping, consult your doctor. - To reduce use of substances - alcohol and tobacco are shown to be harmful to many parts of our health, if you need help, ask for it. The same is true for illegal drugs, if you need help, ask for it. - To find purpose - whether it is caring for loved ones, volunteering, pursuing hobbies, or doing work that means something to you, try and live in accordance with your values. (We discussed the book - Man's Search for Meaning by Roxana Murrain, I highly recommend it to you.) - Healthy relationships - seek healthy relationships where the other person helps you grow and challenges you to be your best. If it does not feel good, seek assistance.  JINNY Morene GORMAN Delsie, MD [facility] Psychiatry Resident

## 2024-03-13 NOTE — ED Provider Notes (Signed)
 Behavioral Health Urgent Care Medical Screening Exam  Patient Name: Lacey Salas MRN: [id number] Date of Evaluation: 03/13/2024 Chief Complaint:   Diagnosis:  Final diagnoses:  None    History of Present illness: Lacey Salas is a 78 y.o. female with complicated past medical and psychiatric history. She reports history of bipolar disorder as diagnosed by her primary care physician, as well as possible previous diagnoses of major depressive order, generalized anxiety disorder, and complex regional pain syndrome.  She reports that since approximately April 2025, she has been having significant worsening of depression symptoms.  She has worked extensively with her PCP and a few psychiatrists in order to try and get on a stable regimen.  She is currently on duloxetine  30 mg daily, olanzapine 10 mg daily, lorazepam  0.5 mg twice daily as needed and daily atenolol .  She reports low mood almost every day, decrease in sleep, anhedonia, intense feelings of guilt and worthlessness along with difficulties concentration and a decrease in appetite leading to a 10 pound weight loss, she also reports that she has had increasing memory problems in this timeframe.  She reports anxiety symptoms including claustrophobia, muscle tension, paresthesias she reports distant history of panic attacks none during this last 70-month timeframe. She reports a number of symptoms consistent with a hypomanic episode including: Between 3 to 4 days of minimal sleep with increased energy, goal-directed activity, impulsive, irritable behavior, low frustration tolerance.  She reports that she has been just downright mean to people.  She denies a history of psychotic symptoms other than temporary visual hallucinations that occurred in approximately May of this year when one of her medications was switched.  She denies recent history of hallucinations.  Patient denies any history of suicidal attempts, serious ideation.  She reports 1  inpatient hospitalization 10 years ago at [facility] when she was going through a number of significant deaths in her family, the loss of her marriage, loss of a pet.  Past Psychiatric Hx: Previous Psych Diagnoses: bipolar disorder, mdd, gad, complex regional pain syndrome Prior inpatient treatment: 1x hospitalization at [facility] 10 years ago Current/prior outpatient treatment: has seen a number of psychiatrists including Dr Geoffry, Dr Tasia Prior rehab hx: denies Psychotherapy hx: denies History of suicide: none History of homicide or aggression: no  Psychiatric medication history: duloxetine  (ineffective), olanzapine (effective), lorazepam  (questionable), cyanocoalbumin, aripiprazole (ineffective), citalopram  (very stable on this med for many years),  Psychiatric medication compliance history: generally good. Neuromodulation history: none Current Psychiatrist: none, wants one. Current therapist: none, wants one.  Substance Abuse Hx: Alcohol: does not use Tobacco: 1 pack per day 34 years Illicit drugs: Denies all   Past Medical History: Medical Diagnoses: Home Rx: Prior Hosp: Prior Surgeries/Trauma: Head trauma, LOC, concussions, seizures:  Allergies: Long list of medication allergies including nortriptyline , penicillins, sulfa drugs, bupropion , epinephrine cephalexin, erythromycin LMP: Postmenopausal PCP: Dr. Dorcus  Family History: Medical:  Family History  Problem Relation Age of Onset   Leukemia Brother        CLL   Cancer Brother    Arthritis Mother    Arthritis Father    Breast cancer Sister    Cancer Sister    Arthritis Other    Ovarian cancer Other    Hyperlipidemia Other    Hypertension Other    Stroke Other    Colon cancer Neg Hx    Psych: Patient and husband report numerous family members with bipolar disorder including her brother Psych Rx: Denies knowledge SA/HA: Completed suicide in maternal aunt  and uncle Substance use family hx: Nephew with a cocaine  problem  Social History: Marital Status: Currently married Sexual orientation: Did not assess Children: 2 adult children 1 lives in [place] , 1 lives in [place]  Employment: Retired Research Scientist (life Sciences) Group: Limited social group Housing: Stable housing lives [place] in [place] Finances: Not strained Legal: No legal issues Military: No Corporate Treasurer Row ED from 08/07/2023 in [facility] ED from 07/13/2023 in [facility] Emergency Department at [facility] ED from 08/02/2021 in [facility] Emergency Department at [facility]  C-SSRS RISK CATEGORY No Risk No Risk No Risk    Psychiatric Specialty Exam Mental Status Exam:  Appearance: Elderly Caucasian female, neat appearance, well-dressed  Behavior: Wringing hands together, fidgeting,  Attitude: Cooperative, anxious  Speech: Soft volume, rapid somewhat pressured speech  Mood:  I am so depressed doc I cannot stand it  Affect: Incongruent, somewhat elevated, very anxious  Thought Process: circumstantial  Thought Content: denies paranoia, delusions  SI/HI: Denies all, denies any history  Perceptions: reports no recent hallucinations, only a few days worth in her entire history  Judgment: Fair  Insight: Shallow  Fund of Knowledge: WNL     Number of hours: No data recorded  Physical Exam: Physical Exam Constitutional:      General: She is not in acute distress.    Appearance: Normal appearance.  HENT:     Head: Normocephalic and atraumatic.     Nose: Nose normal.  Pulmonary:     Effort: Pulmonary effort is normal.  Skin:    General: Skin is dry.  Neurological:     Mental Status: She is alert and oriented to person, place, and time.  Psychiatric:        Attention and Perception: Attention and perception normal.        Mood and Affect: Mood is anxious, depressed and elated.        Speech: Speech is rapid and pressured.        Behavior: Behavior is agitated and  hyperactive. Behavior is cooperative.        Thought Content: Thought content is not paranoid or delusional. Thought content does not include homicidal or suicidal ideation.        Cognition and Memory: Cognition is impaired. Memory is impaired.        Judgment: Judgment is impulsive.    Review of Systems  Constitutional:  Positive for weight loss. Negative for chills and fever.  Gastrointestinal:  Negative for abdominal pain, constipation, diarrhea, heartburn, nausea and vomiting.  Skin:  Positive for rash.  Neurological:  Positive for tremors and headaches. Negative for seizures.  Psychiatric/Behavioral:  Positive for depression and memory loss. Negative for hallucinations, substance abuse and suicidal ideas. The patient is nervous/anxious and has insomnia.    Blood pressure 129/78, pulse 74, temperature 98.6 F (37 C), temperature source Oral, resp. rate 20, SpO2 99%. There is no height or weight on file to calculate BMI.  Musculoskeletal: Strength & Muscle Tone: within normal limits Gait & Station: normal Patient leans: N/A   [facility] MSE Discharge Disposition for Follow up and Recommendations: Based on my evaluation the patient does not appear to have an emergency medical condition and can be discharged with resources and follow up care in outpatient services for Medication Management and Individual Therapy  Patient presenting what is most likely a bipolar 2 mixed hypomanic episode.  It is also possible that she is having a hypomanic response to  the recently started duloxetine  on top of the existing depressive episode. Patient is a poor candidate for inpatient management at this time, thus this must be managed outpatient.  Due to the patient's complex history of medication intolerances (which may be a reflection of her anxious nature), wish to make minimal changes until she can get established with a regular psychiatrist and ideally psychotherapist.    Believe that the most prudent  course is to discontinue possibly detrimental duloxetine  and increase the beneficial olanzapine.  Attempted to get patient quicker follow-up in our [facility] office.  Placed recommendations for therapists.   Lynwood Morene Lavone Delsie, MD 03/13/2024, 1:26 PM

## 2024-03-14 ENCOUNTER — Telehealth (HOSPITAL_COMMUNITY): Payer: Self-pay

## 2024-03-20 ENCOUNTER — Telehealth: Payer: Self-pay | Admitting: Neurology

## 2024-03-20 NOTE — Telephone Encounter (Signed)
 Pt would like a referral to psychiatrist she is feeling like this is the worst she has felt mentally in her entire life

## 2024-03-20 NOTE — Telephone Encounter (Signed)
 Per patient she is in a deep depression and feeling at her lowest.

## 2024-03-21 ENCOUNTER — Telehealth: Payer: Self-pay | Admitting: Neurology

## 2024-03-21 NOTE — Telephone Encounter (Signed)
 Pt called in this afternoon and she wants a return call . Pt has questions. Thanks

## 2024-03-21 NOTE — Telephone Encounter (Signed)
 Patient advised of Dr.Jaffe note,    We can. She was just seen in the ED for her depression where she was seen by one of the psychiatry residents who stated that they will try to get her in for a follow up appointment. She may want to follow up and contact them, presuming they provided her with a number. If she does not want to go that route, we can send a referral as urgent.

## 2024-03-23 ENCOUNTER — Encounter: Payer: Self-pay | Admitting: Psychiatry

## 2024-03-23 ENCOUNTER — Other Ambulatory Visit: Payer: Self-pay

## 2024-03-23 ENCOUNTER — Ambulatory Visit: Admitting: Psychiatry

## 2024-03-23 VITALS — BP 126/72 | HR 98 | Temp 97.8°F | Ht 63.0 in | Wt 133.4 lb

## 2024-03-23 DIAGNOSIS — F331 Major depressive disorder, recurrent, moderate: Secondary | ICD-10-CM | POA: Diagnosis not present

## 2024-03-23 DIAGNOSIS — F411 Generalized anxiety disorder: Secondary | ICD-10-CM

## 2024-03-23 DIAGNOSIS — Z79899 Other long term (current) drug therapy: Secondary | ICD-10-CM | POA: Diagnosis not present

## 2024-03-23 DIAGNOSIS — Z1321 Encounter for screening for nutritional disorder: Secondary | ICD-10-CM

## 2024-03-23 MED ORDER — MIRTAZAPINE 15 MG PO TABS: ORAL_TABLET | ORAL | 1 refills | Status: DC

## 2024-03-23 NOTE — Progress Notes (Signed)
 " Psychiatric Initial Adult Assessment   Patient Identification: Selenia Mihok MRN:  [id number] Date of Evaluation:  03/23/2024 Referral Source: Beam, Lamar POUR, MD  Chief Complaint:   Chief Complaint  Patient presents with   Establish Care   Visit Diagnosis:    ICD-10-CM   1. MDD (major depressive disorder), recurrent episode, moderate (HCC)  F33.1 TSH    2. GAD (generalized anxiety disorder)  F41.1 TSH    3. High risk medication use  Z79.899 EKG 12-Lead    4. Encounter for vitamin deficiency screening  Z13.21 VITAMIN D  25 Hydroxy (Vit-D Deficiency, Fractures)      History of Present Illness:   Ariona Deschene is a 79 y.o.  female with a history of mood disorder, depression, anxiety, dysphagia, GERD, migraine, complex regional pain syndrome of right hand, who is referred for bipolar disorder.   According to the chart review, she was seen at [facility] Dx bipolar 2 mixed hypomanic episode.  She has been on olanzapine  since 07/2023, prescribed by PCP.   Head MRI 02/2023  IMPRESSION: 1. 2 cm enhancing extra-axial mass in the posterior fossa consistent with a meningioma. 2. No acute intracranial abnormality. 3. Moderate to severe chronic small vessel ischemic disease.  She states that she has been feeling depressed and anxious since April 29th 2025.  She was seeing her firefighter.   She states that although she suffers from migraine, she did not have migraine that day. She lumped in a chair. Ambulance was called as there was concern of stroke. She is not doing well since then.  Her life ended that day. My life is gone. She feels jittery and anxious.  Although she used to enjoy gardening crafts, meeting with her friends, she has no interest in these.  Although she loves people interaction, she cannot stay longer than 30 minutes at the restaurant as she feels overstimulated.   Depression- The patient has mood symptoms as in PHQ-9/GAD-7.  She was diagnosed with depression when she lost her  baby in uterine over 40 years ago.  She denies thinking about this as much as the baby is in [place].  However, she also reports that it was traumatic to her as she just moved to [place]  and has not limited support.  She states that the Christmas was just another day.  She sleeps 7 hours with olanzapine .  She was unable to sleep at all before taking his olanzapine .  She feels guilty that she is unable to do things for her husband.  She is unable to do cooking as she used to. She denies SI, Hi, hallucinations.   Appetite-she has significant appetite loss and weight loss.  She has nausea and vomiting when she tries to eat.  She drinks boost in AM, smoothie, and may eat popcorn at night as she feels hungry after taking olanzapine . She agrees to switch this to other non caffeine drinks such as ensure.   Bipolar-she was diagnosed with bipolar disorder by Dr. Dorcus in 2025.  She has a brother with bipolar disorder.  She was shocked with this diagnosis as well as her friends.  Although she admits thinking about her's when she has insomnia, she denies decreased need for sleep, euphoria, increased goal-directed activities.  When she was asked about the assessment in [facility],  She denies any of these symptoms.  She admits to being extremely irritable when she was on TCA years ago, subsided after discontinuation of this medication.   Toribio, her husband presents to the  visit with the patient consent.  He states that she used to be very active.  She was not like this.  Although she used to enjoy Christmas declaration, she did not do anything last year.  He  acknowledges this significant change since she visited firefighter.  He thinks her mental state has been the same, although her physical condition/tremors might be slightly improving. He is not aware of any manic symptoms except marked irritability while she was on TCA years ago.   Physical-she reports hearing loss.  She feels some sensation of electric shock.    Medication- duloxetine  30 mg daily, olanzapine  10 mg at night (helped for insomnia but may have worsened jitteriness) , lorazepam  0.5 mg twice daily as needed and daily atenolol .   Household:  husband Marital status: married.  Number of children: 2 adult children 1 lives in [place] , 1 lives in [place]   Employment: retired. Used to love teaching Pre K  Education:     Substance use  Tobacco Alcohol Other substances/  Current denies denies Denies. Drinks boost in AM  Past denies denies denies  Past Treatment       143 lbs Wt Readings from Last 3 Encounters:  03/23/24 133 lb 6.4 oz (60.5 kg)  08/02/23 137 lb (62.1 kg)  07/30/23 138 lb 4.8 oz (62.7 kg)  Wt 63.5 kg  06/2022  Associated Signs/Symptoms: Depression Symptoms:  depressed mood, anhedonia, insomnia, fatigue, anxiety, (Hypo) Manic Symptoms:  denies decreased need for sleep, euphoria Anxiety Symptoms:  Excessive Worry, Psychotic Symptoms:  denies AH, VH, paranoia PTSD Symptoms: NA  Past Psychiatric History:  Outpatient:  Psychiatry admission: [facility] in 2014 for passive SI in the context of her husband left, loss of her dog. She commented that she denies SI, although she is fine if the lord wants to call me home  Previous suicide attempt: denies  Past trials of medication: citalopram  (very stable on this med for many years),venlafaxine  (racing thoughts per chart),  Amitriptyline  (initially for complex regional pain syndrome), duloxetine  (ineffective), olanzapine  (effective), aripiprazole (ineffective), lorazepam  (questionable),  History of violence:  History of head injury:   Previous Psychotropic Medications: Yes   Substance Abuse History in the last 12 months:  No.  Consequences of Substance Abuse: NA  Past Medical History:  Past Medical History:  Diagnosis Date   Anxiety    Depression    GERD (gastroesophageal reflux disease)    History of chicken pox    Hyperlipidemia    Migraine    complicated  migraines with transient R side facial paralysis and aphasia   Migraine headache with aura    hemiplegic migraine   MVP (mitral valve prolapse)    Osteopenia    Reflex sympathetic dystrophy, unspecified 2011   R hand related to wrist fx, improved s/p nerve blocks   Seizures (HCC)    Stevens-Johnson disease     Past Surgical History:  Procedure Laterality Date   ABDOMINAL HYSTERECTOMY     BSO   CATARACT EXTRACTION     HEEL SPUR SURGERY  12/1996   right   ORIF DISTAL RADIUS FRACTURE  2004    Family Psychiatric History: as below  Family History:  Family History  Problem Relation Age of Onset   Arthritis Mother    Arthritis Father    Breast cancer Sister    Cancer Sister    Depression Brother    Leukemia Brother        CLL   Cancer Brother  Bipolar disorder Brother    Suicidality Paternal Aunt    Suicidality Paternal Uncle    Arthritis Other    Ovarian cancer Other    Hyperlipidemia Other    Hypertension Other    Stroke Other    Colon cancer Neg Hx     Social History:   Social History   Socioeconomic History   Marital status: Legally Separated    Spouse name: Not on file   Number of children: 2   Years of education: Not on file   Highest education level: Associate degree: academic program  Occupational History    Employer: RETIRED  Tobacco Use   Smoking status: Every Day    Current packs/day: 1.00    Types: Cigarettes   Smokeless tobacco: Never   Tobacco comments:    Quit over 30 years ago( just needs it a bit now ) Stress   Substance and Sexual Activity   Alcohol use: No   Drug use: No   Sexual activity: Never  Other Topics Concern   Not on file  Social History Narrative   Right handed   Lives with alone in a two story home   Social Drivers of Health   Tobacco Use: High Risk (03/23/2024)   Patient History    Smoking Tobacco Use: Every Day    Smokeless Tobacco Use: Never    Passive Exposure: Not on file  Financial Resource Strain: Low Risk  (09/26/2023)   Received from [facility]   Overall Financial Resource Strain (CARDIA)    How hard is it for you to pay for the very basics like food, housing, medical care, and heating?: Not hard at all  Food Insecurity: No Food Insecurity (09/26/2023)   Received from [facility]   Epic    Within the past 12 months, you worried that your food would run out before you got the money to buy more.: Never true    Within the past 12 months, the food you bought just didn't last and you didn't have money to get more.: Never true  Transportation Needs: No Transportation Needs (09/26/2023)   Received from [facility]    In the past 12 months, has lack of transportation kept you from medical appointments or from getting medications?: No    In the past 12 months, has lack of transportation kept you from meetings, work, or from getting things needed for daily living?: No  Physical Activity: Inactive (09/26/2023)   Received from [facility]   Exercise Vital Sign    On average, how many days per week do you engage in moderate to strenuous exercise (like a brisk walk)?: 0 days    Minutes of Exercise per Session: Not on file  Stress: Stress Concern Present (09/26/2023)   Received from [facility] - Occupational Stress Questionnaire    Do you feel stress - tense, restless, nervous, or anxious, or unable to sleep at night because your mind is troubled all the time - these days?: Rather much  Social Connections: Socially Integrated (09/26/2023)   Received from [facility]   Social Network    How would you rate your social network (family, work, friends)?: Good participation with social networks  Depression (PHQ2-9): High Risk (03/23/2024)   Depression (PHQ2-9)    PHQ-2 Score: 16  Alcohol Screen: Not on file  Housing: Low Risk (09/26/2023)   Received from [facility]    In the last  12 months, was there a time when you were not able to pay the  mortgage or rent on time?: No    In the past 12 months, how many times have you moved where you were living?: 0    At any time in the past 12 months, were you homeless or living in a shelter (including now)?: No  Utilities: Not At Risk (09/26/2023)   Received from [facility]    In the past 12 months has the electric, gas, oil, or water company threatened to shut off services in your home?: No  Health Literacy: Not on file    Additional Social History: as above  Allergies:  Allergies[1]  Metabolic Disorder Labs: Lab Results  Component Value Date   HGBA1C 5.3 06/19/2020   MPG 105 06/19/2020   MPG 108 06/14/2008   No results found for: PROLACTIN Lab Results  Component Value Date   CHOL 216 (H) 06/19/2020   TRIG 283 (H) 06/19/2020   HDL 45 (L) 06/19/2020   CHOLHDL 4.8 06/19/2020   VLDL 61.5 10/19/2016   LDLCALC 127 (H) 06/19/2020   LDLCALC 125 (H) 10/19/2016   Lab Results  Component Value Date   TSH 1.79 12/14/2022    Therapeutic Level Labs: No results found for: LITHIUM No results found for: CBMZ No results found for: VALPROATE  Current Medications: Current Outpatient Medications  Medication Sig Dispense Refill   atenolol  (TENORMIN ) 25 MG tablet Take 25 mg by mouth.     LORazepam  (ATIVAN ) 0.5 MG tablet Take 0.5 mg by mouth.     mirtazapine  (REMERON ) 15 MG tablet 7.5 mg at night for one week, then 15 mg at night 30 tablet 1   DULoxetine  (CYMBALTA ) 30 MG capsule Take 30 mg by mouth daily.     Multiple Vitamins-Minerals (VITAMIN D3 COMPLETE PO) Take by mouth daily.     OLANZapine  (ZYPREXA ) 5 MG tablet Take 1 tablet (5 mg total) by mouth daily AND 2 tablets (10 mg total) at bedtime. 100 tablet 0   No current facility-administered medications for this visit.    Musculoskeletal: Strength & Muscle Tone: within normal limits Gait & Station: normal Patient leans: N/A  Psychiatric Specialty Exam: Review of Systems  Psychiatric/Behavioral:  Positive  for dysphoric mood. Negative for agitation, behavioral problems, confusion, decreased concentration, hallucinations, self-injury, sleep disturbance and suicidal ideas. The patient is nervous/anxious. The patient is not hyperactive.   All other systems reviewed and are negative.   Blood pressure 126/72, pulse 98, temperature 97.8 F (36.6 C), temperature source Temporal, height 5' 3 (1.6 m), weight 133 lb 6.4 oz (60.5 kg).Body mass index is 23.63 kg/m.  General Appearance: Well Groomed  Eye Contact:  Good  Speech:  Clear and Coherent  Volume:  Normal  Mood:  Anxious and Depressed  Affect:  Appropriate, Congruent, and Restricted  Thought Process:  Coherent  Orientation:  Full (Time, Place, and Person)  Thought Content:  Logical  Suicidal Thoughts:  No  Homicidal Thoughts:  No  Memory:  Immediate;   Fair  Judgement:  Good  Insight:  Good  Psychomotor Activity:  Tremor, no cogwheel rigidity  Concentration:  Concentration: Good and Attention Span: Good  Recall:  Fair  Fund of Knowledge:Good  Language: Good  Akathisia:  No  Handed:  Right  AIMS (if indicated):  dyskinesia in arms, leg.  Assets:  Communication Skills Desire for Improvement  ADL's:  Intact  Cognition: WNL  Sleep:  Fair   Screenings: AUDIT  Flowsheet Row [facility]  Alcohol Use Disorder Identification Test Final Score (AUDIT) 0   GAD-7    Flowsheet Row Office Visit from 03/23/2024 in [facility]  Total GAD-7 Score 12   Mini-Mental    Flowsheet Row Office Visit from 03/10/2017 in [facility]  Total Score (max 30 points ) 19   PHQ2-9    Flowsheet Row Office Visit from 03/23/2024 in [facility] Clinical Support from 10/05/2017 in [facility] [place] Clinical Support from 10/01/2016 in [facility]  [facility] [place] Office Visit from 09/02/2015 in [facility] Office Visit from 05/16/2014 in [facility]  PHQ-2 Total Score 6 2 0 3 2  PHQ-9 Total Score 16 -- -- 16 12   Flowsheet Row ED from 08/07/2023 in [facility] ED from 07/13/2023 in [facility] ED from 08/02/2021 in [facility] Emergency Department at [facility]  C-SSRS RISK CATEGORY No Risk No Risk No Risk    Assessment and Plan:  Kyia Rhude is a 79 y.o.  female with a history of mood disorder, depression, anxiety, dysphagia, GERD, migraine, complex regional pain syndrome of right hand, SJS from [place], who is referred for bipolar disorder.   1. MDD (major depressive disorder), recurrent episode, moderate (HCC) 2. GAD (generalized anxiety disorder) She has family history of bipolar disorder. She had miscarriage when she moved to [place]  over 40 years ago (traumatic). She reports good relationship with her husband and her children.  History: admitted to [facility] in 2015 due to concern of SI after she commented if the lord wants to call me home.  she reports significant worsening in depressive symptoms and anxiety since April 2024 where she had an episode of AMS.  The exam is notable for tense affect. She reports sudden onset of significant worsening in depressive symptoms with marked anhedonia, anxiety since she had an episode of altered mental status in April 2024 during meeting with her financial advisor.  Although she was diagnosed with bipolar disorder by her primary care provider, and has a family history of bipolar disorder, she denies any clinical course consistent with bipolar disorder.  It is noted that her husband is also not aware of any of those symptoms except she experienced irritability years ago, likely attributable to adverse reaction from TCA.  Will taper off olanzapine  due to concern of dyskinesia, and  lack of benefit for her mood symptoms.  Both of them agreed to monitor for any manic symptoms.  Will start mirtazapine  to target depression, anxiety, decrease in appetite and insomnia.  Discussed potential metabolic side effect and drowsiness.  Will continue duloxetine  on current dose at this time to target depression/anxiety.  Will obtain labs to rule out medical health issues contributing to her symptoms especially given her marked appetite loss.   # Insomnia Although she reports some benefit from olanzapine , she reports possible worsening in anxiety the next day.  Will taper off this medication and start mirtazapine  as outlined above.   # r/o cognitive impairment The exam is notable for short-term memory loss, which is evidenced by her asking repeated questions.  However, she is able to explain her medical condition in details and past history.  This could be related to the anxiety she is experiencing.  Will continue to monitor and intervene  as accordingly.   3. High risk medication use She had a borderline QTc in previous EKG. will recheck EKG.   Continue duloxetine  30 mg daily Start mirtazapine  7.5 mg at night for one week, then 15 mg at night  Decrease olanzapine  5 mg at night for one week, then discontinue  Obtain labs (TSH, vitamin D ) Obtain EKG - please call [phone] to make an appointment (she may get this through her PCP) Next appointment- 2/19 at 10:30, IP. Watilist for sooner visit in early Feb - on lorazepam  0.5 mg twice daily as needed and daily atenolol .   Collaboration of Care: Other reviewed notes in Epic  A total of 70 minutes was spent on the following activities during the encounter date, which includes but is not limited to: preparing to see the patient (e.g., reviewing tests and records), obtaining and/or reviewing separately obtained history, performing a medically necessary examination or evaluation, counseling and educating the patient, family, or caregiver,  ordering medications, tests, or procedures, referring and communicating with other healthcare professionals (when not reported separately), documenting clinical information in the electronic or paper health record, independently interpreting test or lab results and communicating these results to the family or caregiver, and coordinating care (when not reported separately).   Patient/Guardian was advised Release of Information must be obtained prior to any record release in order to collaborate their care with an outside provider. Patient/Guardian was advised if they have not already done so to contact the registration department to sign all necessary forms in order for us  to release information regarding their care.   Consent: Patient/Guardian gives verbal consent for treatment and assignment of benefits for services provided during this visit. Patient/Guardian expressed understanding and agreed to proceed.   Katheren Sleet, MD 1/8/20265:28 PM     [1]  Allergies Allergen Reactions   Cephalexin     REACTION: Mannie Louder   Epinephrine     REACTION: hypertensive reaction   Erythromycin     REACTION: Rash   Lactated Ringers     REACTION: cardiac symptoms   Nortriptyline      Rapid heart rate   Penicillins     REACTION: Rash   Sulfamethoxazole-Trimethoprim     REACTION: Mannie Louder   Sulfonamide Derivatives    Trimethoprim     REACTION: stevens johnson syndrome   Wellbutrin  [Bupropion ] Other (See Comments)    tremors   "

## 2024-03-23 NOTE — Patient Instructions (Signed)
 Continue duloxetine  30 mg daily Start mirtazapine  7.5 mg at night for one week, then 15 mg at night  Decrease olanzapine  5 mg at night for one week, then discontinue  Obtain labs (TSH, vitamin D ) Obtain EKG - please call [phone] to make an appointment  Next appointment- 2/19 at 10:30,

## 2024-03-23 NOTE — Telephone Encounter (Signed)
 Patient seen today

## 2024-03-24 NOTE — Addendum Note (Signed)
 Addended by: VICKEY METTLE on: 03/24/2024 09:03 AM   Modules accepted: Level of Service

## 2024-03-28 ENCOUNTER — Other Ambulatory Visit
Admission: RE | Admit: 2024-03-28 | Discharge: 2024-03-28 | Disposition: A | Source: Ambulatory Visit | Attending: Psychiatry | Admitting: Psychiatry

## 2024-03-28 ENCOUNTER — Telehealth: Payer: Self-pay

## 2024-03-28 ENCOUNTER — Ambulatory Visit: Admission: RE | Admit: 2024-03-28 | Discharge: 2024-03-28 | Attending: Psychiatry

## 2024-03-28 DIAGNOSIS — R251 Tremor, unspecified: Secondary | ICD-10-CM | POA: Insufficient documentation

## 2024-03-28 DIAGNOSIS — I499 Cardiac arrhythmia, unspecified: Secondary | ICD-10-CM

## 2024-03-28 DIAGNOSIS — Z1321 Encounter for screening for nutritional disorder: Secondary | ICD-10-CM | POA: Insufficient documentation

## 2024-03-28 DIAGNOSIS — F331 Major depressive disorder, recurrent, moderate: Secondary | ICD-10-CM | POA: Diagnosis not present

## 2024-03-28 DIAGNOSIS — R001 Bradycardia, unspecified: Secondary | ICD-10-CM | POA: Diagnosis present

## 2024-03-28 DIAGNOSIS — Z5181 Encounter for therapeutic drug level monitoring: Secondary | ICD-10-CM | POA: Diagnosis not present

## 2024-03-28 DIAGNOSIS — F418 Other specified anxiety disorders: Secondary | ICD-10-CM | POA: Insufficient documentation

## 2024-03-28 DIAGNOSIS — F411 Generalized anxiety disorder: Secondary | ICD-10-CM | POA: Insufficient documentation

## 2024-03-28 LAB — VITAMIN D 25 HYDROXY (VIT D DEFICIENCY, FRACTURES): Vit D, 25-Hydroxy: 32.8 ng/mL (ref 30–100)

## 2024-03-28 LAB — TSH: TSH: 1.68 u[IU]/mL (ref 0.350–4.500)

## 2024-03-28 NOTE — Telephone Encounter (Signed)
 Thank you. Please notify that lorazepam  and atenolol , have been prescribed by other provider- please advise her to reach out to that provider for further guidance. I meant to document she has been on lorazepam  0.5 mg twice daily as needed and daily atenolol . I would edit the note to avoid confusion.

## 2024-03-28 NOTE — Telephone Encounter (Signed)
Patient confirmed and verbalized understanding.  

## 2024-03-28 NOTE — Telephone Encounter (Signed)
 Patient left voicemail asking if she could go to any [facility] location and if there was a paper requisition available. I printed the lab requisition and left it at the front desk. Patient stated she would pick it up, as she preferred to provide the requisition directly to [facility].Pt asking if she still had to continue medication atenolol  per note from provider I read to herContinue lorazepam  0.5 mg twice daily as needed and daily atenolol . Patient confirmed and verbalized understanding .

## 2024-03-29 ENCOUNTER — Ambulatory Visit: Payer: Self-pay | Admitting: Psychiatry

## 2024-03-29 NOTE — Progress Notes (Signed)
 Please advise her that labs are within acceptable range.

## 2024-03-29 NOTE — Progress Notes (Signed)
 Please let her know that her EKG is within an acceptable range. Please advise her to adjust medication as advised at her recent visit.

## 2024-03-30 NOTE — Progress Notes (Signed)
 Spoke to patient she voiced understanding she is requesting something to help with her appetite she is not able to eat and this has been going on for about 9 months and she has lost about 13 lbs please advise

## 2024-03-30 NOTE — Progress Notes (Signed)
 Called patient to discuss EKG results no answer left voicemail for patient to return call to office

## 2024-03-30 NOTE — Progress Notes (Signed)
 Please advise her the following (I believe she also has AVS) Continue duloxetine  30 mg daily Start mirtazapine  7.5 mg at night for one week, then 15 mg at night  Decrease olanzapine  5 mg at night for one week, then discontinue

## 2024-03-30 NOTE — Progress Notes (Signed)
 Patient did return call to office informed patient of the EKG and lab results she is confused about the medication adjustment she is not sure what was discussed in the recent visit please advise

## 2024-03-31 NOTE — Progress Notes (Signed)
 Mirtazapine , which we discussed to start and increase after one week, will hopefully help with her appetite. Please advise her to make this adjustment if she hasnt done so yet.

## 2024-03-31 NOTE — Progress Notes (Signed)
 Called patient to discuss the medication for her appetite she voiced understanding

## 2024-04-03 ENCOUNTER — Telehealth: Payer: Self-pay

## 2024-04-03 NOTE — Telephone Encounter (Signed)
 Medication management - Message left for patient to request she go back down on her prescribed Mirtazapine  to 7.5 mg at bedtime. Informed Dr. Hisada was aware of the appetite concerns but wanted to get her sleep better first and then she also has her down to get patient back in for an earlier appointment to address issues at that time.  Requested patient call our office back if not okay with this plan or any other concerns at this time.

## 2024-04-03 NOTE — Telephone Encounter (Signed)
 Medication managemernt - Called patient back, after she left a message sxhe had gone up on her Mirtazapine  to the full dosage of 15 mg, one at bedtime. Patient stated she had a harder time going to sleep and feels more shaky and dizzy with higher dosage. Patient stated the previous night was the first night of going up on the dosage and was sleeping better with the 7.5 mg dosage; however reported she was still taking Zyprexa  5 mg dosage then with it.  Patient reported she also has no appetite with change in medication to Mirtazapine  and just does not feel as well with its use, shaky, dizzy, loss of appetite, and not sleeping as well with the new medication.  Agreed to inform Dr. Vickey of her reported concerns and issues with Mirtazapine  and particularly with the 15 mg dosage.

## 2024-04-03 NOTE — Telephone Encounter (Signed)
 Could you ask if she feels comfortable reducing mirtazapine  to 7.5 mg at this time? I understand the concern about appetite loss, and we can consider further adjustments to her medication if needed, but Id like to avoid additional side effects and give her time to get used to this medication. She is on the waitlist for an earlier appointment as well.

## 2024-04-04 ENCOUNTER — Encounter: Payer: Self-pay | Admitting: Psychiatry

## 2024-04-04 ENCOUNTER — Telehealth: Payer: Self-pay

## 2024-04-04 ENCOUNTER — Telehealth: Admitting: Psychiatry

## 2024-04-04 DIAGNOSIS — F331 Major depressive disorder, recurrent, moderate: Secondary | ICD-10-CM | POA: Diagnosis not present

## 2024-04-04 DIAGNOSIS — G4700 Insomnia, unspecified: Secondary | ICD-10-CM

## 2024-04-04 DIAGNOSIS — F411 Generalized anxiety disorder: Secondary | ICD-10-CM

## 2024-04-04 MED ORDER — QUETIAPINE FUMARATE 25 MG PO TABS: 25.0000 mg | ORAL_TABLET | Freq: Every day | ORAL | 0 refills | Status: DC

## 2024-04-04 NOTE — Patient Instructions (Signed)
 Continue duloxetine  30 mg daily Start quetiapine  25 mg at night  Discontinue mirtazapine  Next appointment- 2/19 at 10:30

## 2024-04-04 NOTE — Telephone Encounter (Signed)
 She had a visit with me today. Details in the note.

## 2024-04-04 NOTE — Progress Notes (Addendum)
**Note Lacey-Identified via Obfuscation**  Virtual Visit via Video Note  I connected with Lacey Salas on 04/04/24 at 11:00 AM EST by a video enabled telemedicine application and verified that I am speaking with the correct person using two identifiers.  Location: Patient: home Provider: office Persons participated in the visit- patient, provider    I discussed the limitations of evaluation and management by telemedicine and the availability of in person appointments. The patient expressed understanding and agreed to proceed.    I discussed the assessment and treatment plan with the patient. The patient was provided an opportunity to ask questions and all were answered. The patient agreed with the plan and demonstrated an understanding of the instructions.   The patient was advised to call back or seek an in-person evaluation if the symptoms worsen or if the condition fails to improve as anticipated.    Katheren Sleet, MD    [facility] MD/PA/NP OP Progress Note  04/04/2024 12:23 PM Lacey Salas  MRN:  [id number]  Chief Complaint:  Chief Complaint  Patient presents with   Follow-up   HPI:  This is a follow-up appointment for depression and insomnia, appetite loss.  This appointment is made urgently due to the patient concern.  She states that she feels fainting and cortisol rushing through her body. She feels like throwing up, and she feels shaky.  She feels very weak.  She reduced mirtazapine  last night under the guidance from this provider.  She slept from 1-8 am, and she is unsure if her dizziness is better.  She feels so depressed.  Although she adamantly denies any SI, she states that living is hard and she cannot think straight.  Although she states that she cannot stand due to dizziness, she is walking around the house during the visit, stating that she has been doing this way.  She feels like having no purpose.  Although she always enjoyed cleaning the house, she cannot do this anymore.  Although she prays, she is not sure if it  is helping anymore.  She drinks boost up to 3 times a day.  She eats yogurt, pineapple, chicken nugget, ice cream- trying to eat at least 500 kcal.  She took lorazepam  yesterday, and a few days ago for anxiety.  She denies hallucinations.She has not noticed any change in shakiness since discontinuation of olanzapine . She was able to sleep faster with olanzapine .  She does not want to be admitted, although she eventually agreed to come to [facility]/emergent resources if any worsening in her condition.   Rolan presents to the visit.  He states that she has been doing worse since the previous visit.  He agrees to reach out to PCP to check for her physical condition related to appetite loss, dizziness. He also agrees to monitor any possible side effect from quetiapine .   Visit Diagnosis:    ICD-10-CM   1. MDD (major depressive disorder), recurrent episode, moderate (HCC)  F33.1     2. GAD (generalized anxiety disorder)  F41.1     3. Insomnia, unspecified type  G47.00       Past Psychiatric History: Please see initial evaluation for full details. I have reviewed the history. No updates at this time.     Past Medical History:  Past Medical History:  Diagnosis Date   Anxiety    Depression    GERD (gastroesophageal reflux disease)    History of chicken pox    Hyperlipidemia    Migraine    complicated migraines with transient R side facial  paralysis and aphasia   Migraine headache with aura    hemiplegic migraine   MVP (mitral valve prolapse)    Osteopenia    Reflex sympathetic dystrophy, unspecified 2011   R hand related to wrist fx, improved s/p nerve blocks   Seizures (HCC)    Stevens-Johnson disease     Past Surgical History:  Procedure Laterality Date   ABDOMINAL HYSTERECTOMY     BSO   CATARACT EXTRACTION     HEEL SPUR SURGERY  12/1996   right   ORIF DISTAL RADIUS FRACTURE  2004    Family Psychiatric History: Please see initial evaluation for full details. I have reviewed the  history. No updates at this time.     Family History:  Family History  Problem Relation Age of Onset   Arthritis Mother    Arthritis Father    Breast cancer Sister    Cancer Sister    Depression Brother    Leukemia Brother        CLL   Cancer Brother    Bipolar disorder Brother    Suicidality Paternal Aunt    Suicidality Paternal Uncle    Arthritis Other    Ovarian cancer Other    Hyperlipidemia Other    Hypertension Other    Stroke Other    Colon cancer Neg Hx     Social History:  Social History   Socioeconomic History   Marital status: Legally Separated    Spouse name: Not on file   Number of children: 2   Years of education: Not on file   Highest education level: Associate degree: academic program  Occupational History    Employer: RETIRED  Tobacco Use   Smoking status: Every Day    Current packs/day: 1.00    Types: Cigarettes   Smokeless tobacco: Never   Tobacco comments:    Quit over 30 years ago( just needs it a bit now ) Stress   Substance and Sexual Activity   Alcohol use: No   Drug use: No   Sexual activity: Never  Other Topics Concern   Not on file  Social History Narrative   Right handed   Lives with alone in a two story home   Social Drivers of Health   Tobacco Use: High Risk (04/04/2024)   Patient History    Smoking Tobacco Use: Every Day    Smokeless Tobacco Use: Never    Passive Exposure: Not on file  Financial Resource Strain: Low Risk (09/26/2023)   Received from [facility]   Overall Financial Resource Strain (CARDIA)    How hard is it for you to pay for the very basics like food, housing, medical care, and heating?: Not hard at all  Food Insecurity: No Food Insecurity (09/26/2023)   Received from [facility]   Epic    Within the past 12 months, you worried that your food would run out before you got the money to buy more.: Never true    Within the past 12 months, the food you bought just didn't last and you didn't have money to  get more.: Never true  Transportation Needs: No Transportation Needs (09/26/2023)   Received from [facility]    In the past 12 months, has lack of transportation kept you from medical appointments or from getting medications?: No    In the past 12 months, has lack of transportation kept you from meetings, work, or from getting things needed for daily living?: No  Physical  Activity: Inactive (09/26/2023)   Received from [facility]   Exercise Vital Sign    On average, how many days per week do you engage in moderate to strenuous exercise (like a brisk walk)?: 0 days    Minutes of Exercise per Session: Not on file  Stress: Stress Concern Present (09/26/2023)   Received from [facility] of Occupational Health - Occupational Stress Questionnaire    Do you feel stress - tense, restless, nervous, or anxious, or unable to sleep at night because your mind is troubled all the time - these days?: Rather much  Social Connections: Socially Integrated (09/26/2023)   Received from [facility]   Social Network    How would you rate your social network (family, work, friends)?: Good participation with social networks  Depression (PHQ2-9): High Risk (03/23/2024)   Depression (PHQ2-9)    PHQ-2 Score: 16  Alcohol Screen: Not on file  Housing: Low Risk (09/26/2023)   Received from [facility]    In the last 12 months, was there a time when you were not able to pay the mortgage or rent on time?: No    In the past 12 months, how many times have you moved where you were living?: 0    At any time in the past 12 months, were you homeless or living in a shelter (including now)?: No  Utilities: Not At Risk (09/26/2023)   Received from [facility]    In the past 12 months has the electric, gas, oil, or water company threatened to shut off services in your home?: No  Health Literacy: Not on file    Allergies: Allergies[1]  Metabolic Disorder Labs: Lab Results   Component Value Date   HGBA1C 5.3 06/19/2020   MPG 105 06/19/2020   MPG 108 06/14/2008   No results found for: PROLACTIN Lab Results  Component Value Date   CHOL 216 (H) 06/19/2020   TRIG 283 (H) 06/19/2020   HDL 45 (L) 06/19/2020   CHOLHDL 4.8 06/19/2020   VLDL 61.5 10/19/2016   LDLCALC 127 (H) 06/19/2020   LDLCALC 125 (H) 10/19/2016   Lab Results  Component Value Date   TSH 1.680 03/28/2024   TSH 1.79 12/14/2022    Therapeutic Level Labs: No results found for: LITHIUM No results found for: VALPROATE No results found for: CBMZ  Current Medications: Current Outpatient Medications  Medication Sig Dispense Refill   QUEtiapine  (SEROQUEL ) 25 MG tablet Take 1 tablet (25 mg total) by mouth at bedtime. 30 tablet 0   atenolol  (TENORMIN ) 25 MG tablet Take 25 mg by mouth.     DULoxetine  (CYMBALTA ) 30 MG capsule Take 30 mg by mouth daily.     LORazepam  (ATIVAN ) 0.5 MG tablet Take 0.5 mg by mouth.     mirtazapine  (REMERON ) 15 MG tablet 7.5 mg at night for one week, then 15 mg at night 30 tablet 1   Multiple Vitamins-Minerals (VITAMIN D3 COMPLETE PO) Take by mouth daily.     OLANZapine  (ZYPREXA ) 5 MG tablet Take 1 tablet (5 mg total) by mouth daily AND 2 tablets (10 mg total) at bedtime. (Patient not taking: No sig reported) 100 tablet 0   No current facility-administered medications for this visit.     Musculoskeletal: Strength & Muscle Tone: N/A Gait & Station: N/A Patient leans: N/A  Psychiatric Specialty Exam: Review of Systems  Psychiatric/Behavioral:  Positive for confusion, decreased concentration, dysphoric mood and sleep disturbance. Negative  for agitation, behavioral problems, hallucinations, self-injury and suicidal ideas. The patient is nervous/anxious. The patient is not hyperactive.   All other systems reviewed and are negative.   There were no vitals taken for this visit.There is no height or weight on file to calculate BMI.  General Appearance: Well  Groomed  Eye Contact:  Good  Speech:  Clear and Coherent  Volume:  Normal  Mood:  Anxious  Affect:  Appropriate, Congruent, and tense  Thought Process:  Coherent  Orientation:  Full (Time, Place, and Person)  Thought Content: Logical   Suicidal Thoughts:  No  Homicidal Thoughts:  No  Memory:  Immediate;   Good  Judgement:  Good  Insight:  Good  Psychomotor Activity:  very subtle orofacial tremor  Concentration:  Concentration: Poor and Attention Span: Poor  Recall:  Fair  Fund of Knowledge: Good  Language: Good  Akathisia:  No  Handed:  Right  AIMS (if indicated): not done  Assets:  Communication Skills Desire for Improvement  ADL's:  Intact  Cognition: WNL  Sleep:  Poor   Screenings: AUDIT    Flowsheet Row [facility]  Alcohol Use Disorder Identification Test Final Score (AUDIT) 0   GAD-7    Flowsheet Row Office Visit from 03/23/2024 in [facility]  Total GAD-7 Score 12   Mini-Mental    Flowsheet Row Office Visit from 03/10/2017 in [facility]  Total Score (max 30 points ) 19   PHQ2-9    Flowsheet Row Office Visit from 03/23/2024 in [facility] Clinical Support from 10/05/2017 in [facility] [place] Clinical Support from 10/01/2016 in [facility] [place] Office Visit from 09/02/2015 in [facility] Office Visit from 05/16/2014 in [facility]  PHQ-2 Total Score 6 2 0 3 2  PHQ-9 Total Score 16 -- -- 16 12   Flowsheet Row ED from 08/07/2023 in [facility] ED from 07/13/2023 in [facility] at [facility] ED from 08/02/2021 in [facility]  C-SSRS RISK CATEGORY No Risk No Risk No Risk     Assessment and  Plan:  Lacey Salas is a 79 y.o.  female with a history of mood disorder, depression, anxiety, dysphagia, GERD, migraine, complex regional pain syndrome of right hand, SJS from [place], who presents for follow up appointment for below.   1. MDD (major depressive disorder), recurrent episode, moderate (HCC) 2. GAD (generalized anxiety disorder) She has family history of bipolar disorder. She had miscarriage when she moved to [place]  over 40 years ago (traumatic). She reports good relationship with her husband and her children.  History: admitted to [facility] in 2015 due to concern of SI after she commented if the lord wants to call me home.  she reports significant worsening in depressive symptoms and anxiety since April 2024 where she had an episode of AMS.  The exam is notable for tense affect, rumination, and she needed repeated instruction, which appears to be worsening compared to the previous visit.  She reports worsening in dizziness, and reports limited benefit for insomnia and appetite from mirtazapine .  Although we have not been able to see the actual effectiveness from this medication, we will discontinue this medication due to concern of its adverse reaction.  Will start quetiapine  to target depression, anxiety, insomnia,  appetite loss.  Discussed possible risk of metabolic side effect, EPS, orthostatic hypotension, will monitor QTc prolongation.  Will continue current dose of duloxetine  at this time to target depression and anxiety.   3. Insomnia, unspecified type Although she was able to sleep several hours last night, it has been slightly worsening since discontinuation of olanzapine .  Will start quetiapine  as outlined above.   # appetite loss # dizziness She has several months of appetite loss and has weight loss.  She reports drowsiness.  Although it could be attributable to adverse reaction from mirtazapine , both the patient and her husband agrees to reach out to her PCP to address any  possible physical cause.  They were also advised to utilize emergency resources if any worsening.    # r/o cognitive impairment The exam is notable for short-term memory loss, which is evidenced by her asking repeated questions.  However, she is able to explain her medical condition in details and past history.  This could be related to the anxiety she is experiencing.  Will continue to monitor and intervene as accordingly.    3. High risk medication use    Last checked  EKG HR 82, QTc427msec 03/2024  Lipid panels    HbA1c      Plan Continue duloxetine  30 mg daily Start quetiapine  25 mg at night  Discontinue mirtazapine  Next appointment- 2/19 at 10:30, IP. - on lorazepam  0.5 mg twice daily as needed and daily atenolol .   Past trials-  mirtazapine  (possible dizziness)  Collaboration of Care: Collaboration of Care: Other reviewed notes in [facility]  Patient/Guardian was advised Release of Information must be obtained prior to any record release in order to collaborate their care with an outside provider. Patient/Guardian was advised if they have not already done so to contact the registration department to sign all necessary forms in order for us  to release information regarding their care.   Consent: Patient/Guardian gives verbal consent for treatment and assignment of benefits for services provided during this visit. Patient/Guardian expressed understanding and agreed to proceed.    Katheren Sleet, MD 04/04/2024, 12:23 PM     [1]  Allergies Allergen Reactions   Cephalexin     REACTION: Mannie Louder   Epinephrine     REACTION: hypertensive reaction   Erythromycin     REACTION: Rash   Lactated Ringers     REACTION: cardiac symptoms   Nortriptyline      Rapid heart rate   Penicillins     REACTION: Rash   Sulfamethoxazole-Trimethoprim     REACTION: Mannie Louder   Sulfonamide Derivatives    Trimethoprim     REACTION: stevens johnson syndrome   Wellbutrin  [Bupropion ] Other  (See Comments)    tremors

## 2024-04-04 NOTE — Telephone Encounter (Signed)
 Patient called stating that she is having issues with the mirtazapine  (REMERON ) 15 MG tablet   She is feeling dizzy and faint she is taking the medication at night with food which is chicken nuggets from the frozen section in [employer] and boost she also stated that she had ice cream she has not been to the ER or Urgent Care  She stated that she was not able to make into the office because she is in [place].

## 2024-04-05 ENCOUNTER — Telehealth: Payer: Self-pay

## 2024-04-05 NOTE — Telephone Encounter (Signed)
 Patient called to report that she did not sleep last night was up until 3 am she finally went to sleep after taking a Lorazepam  and woke up around 8-8:30 am she still does not have a appetite and seems to think that the Olanzapine  helped with her appetite and sleep please advise

## 2024-04-05 NOTE — Telephone Encounter (Signed)
 Called patient to advise to take the Quetiapine  50 mg for a week to see how it works patient voiced understanding and will call in a week and give a report of how she is doing

## 2024-04-05 NOTE — Telephone Encounter (Signed)
 Please advise her to increase quetiapine  to 50 mg at night for at least one week to see how it works. This medication may work better than olanzapine  for anxiety/depression, which is why it was selected. We also want to increase the dose gradually to reduce the risk of side effects. A higher dose may also help improve her insomnia and appetite loss.

## 2024-04-06 ENCOUNTER — Telehealth: Payer: Self-pay

## 2024-04-06 NOTE — Telephone Encounter (Signed)
 Called patient to discuss message from provider she voiced understanding and will call the office in about a week to give a update

## 2024-04-06 NOTE — Telephone Encounter (Signed)
 Patient called to report that she took the quetiapine  last night she felt normal and had a appetite she went to sleep and woke up feeling like she was smothering she thought it could have been a dream she was very shaky and scared wants to know if she can conitnue the  Quetiapine  at night and take a Lorazepam  during the day to keep her calm during the day please advise

## 2024-04-06 NOTE — Telephone Encounter (Signed)
 If she is no longer experiencing the feeling of smothering, I would advise staying on the same dose of quetiapine , especially since it has been helpful for her appetite and mood if she feels comfortable continuing it. Yes, she may take lorazepam  as needed for now, though please advise her to use it cautiously as it may cause dizziness. If she has another similar episode, please instruct her to contact us .

## 2024-04-07 ENCOUNTER — Emergency Department (HOSPITAL_COMMUNITY)

## 2024-04-07 ENCOUNTER — Encounter (HOSPITAL_COMMUNITY): Payer: Self-pay

## 2024-04-07 ENCOUNTER — Emergency Department (HOSPITAL_COMMUNITY)
Admission: EM | Admit: 2024-04-07 | Discharge: 2024-04-07 | Disposition: A | Discharge status: Home / Self Care | Attending: Emergency Medicine | Admitting: Emergency Medicine

## 2024-04-07 ENCOUNTER — Other Ambulatory Visit: Payer: Self-pay

## 2024-04-07 DIAGNOSIS — E860 Dehydration: Secondary | ICD-10-CM

## 2024-04-07 DIAGNOSIS — D72829 Elevated white blood cell count, unspecified: Secondary | ICD-10-CM | POA: Diagnosis not present

## 2024-04-07 DIAGNOSIS — I951 Orthostatic hypotension: Secondary | ICD-10-CM

## 2024-04-07 DIAGNOSIS — R251 Tremor, unspecified: Secondary | ICD-10-CM | POA: Diagnosis not present

## 2024-04-07 DIAGNOSIS — R42 Dizziness and giddiness: Secondary | ICD-10-CM | POA: Diagnosis present

## 2024-04-07 DIAGNOSIS — R55 Syncope and collapse: Secondary | ICD-10-CM | POA: Insufficient documentation

## 2024-04-07 LAB — URINALYSIS, ROUTINE W REFLEX MICROSCOPIC
Bilirubin Urine: NEGATIVE
Glucose, UA: NEGATIVE mg/dL
Hgb urine dipstick: NEGATIVE
Ketones, ur: NEGATIVE mg/dL
Nitrite: NEGATIVE
Protein, ur: NEGATIVE mg/dL
Specific Gravity, Urine: 1.006 (ref 1.005–1.030)
pH: 7 (ref 5.0–8.0)

## 2024-04-07 LAB — COMPREHENSIVE METABOLIC PANEL WITH GFR
ALT: 11 U/L (ref 0–44)
AST: 18 U/L (ref 15–41)
Albumin: 4 g/dL (ref 3.5–5.0)
Alkaline Phosphatase: 94 U/L (ref 38–126)
Anion gap: 10 (ref 5–15)
BUN: 15 mg/dL (ref 8–23)
CO2: 23 mmol/L (ref 22–32)
Calcium: 8.9 mg/dL (ref 8.9–10.3)
Chloride: 107 mmol/L (ref 98–111)
Creatinine, Ser: 0.54 mg/dL (ref 0.44–1.00)
GFR, Estimated: >60 mL/min
Glucose, Bld: 108 mg/dL — ABNORMAL HIGH (ref 70–99)
Potassium: 4.1 mmol/L (ref 3.5–5.1)
Sodium: 140 mmol/L (ref 135–145)
Total Bilirubin: 0.3 mg/dL (ref 0.0–1.2)
Total Protein: 6.5 g/dL (ref 6.5–8.1)

## 2024-04-07 LAB — CBC
HCT: 41.5 % (ref 36.0–46.0)
Hemoglobin: 14 g/dL (ref 12.0–15.0)
MCH: 31.6 pg (ref 26.0–34.0)
MCHC: 33.7 g/dL (ref 30.0–36.0)
MCV: 93.7 fL (ref 80.0–100.0)
Platelets: 315 K/uL (ref 150–400)
RBC: 4.43 MIL/uL (ref 3.87–5.11)
RDW: 12.9 % (ref 11.5–15.5)
WBC: 11.9 K/uL — ABNORMAL HIGH (ref 4.0–10.5)
nRBC: 0 % (ref 0.0–0.2)

## 2024-04-07 LAB — LIPASE, BLOOD: Lipase: 21 U/L (ref 11–51)

## 2024-04-07 MED ORDER — ALPRAZOLAM 0.5 MG PO TABS
0.5000 mg | ORAL_TABLET | Freq: Once | ORAL | Status: AC
  Administered 2024-04-07: 0.5 mg via ORAL
  Filled 2024-04-07: qty 1

## 2024-04-07 MED ORDER — SODIUM CHLORIDE 0.9 % IV BOLUS
1000.0000 mL | Freq: Once | INTRAVENOUS | Status: AC
  Administered 2024-04-07: 1000 mL via INTRAVENOUS

## 2024-04-07 MED ORDER — ALPRAZOLAM 0.5 MG PO TABS: 0.5000 mg | ORAL_TABLET | Freq: Once | ORAL | Status: DC

## 2024-04-07 NOTE — ED Provider Notes (Signed)
 Pt with sx of FTT over the last few days with no appetite, worsening depression and feeling unwell.  Reports she feels bad and orthostatic with EMS.  No infectious sx and has received some xanax here due to anxiety.  Pt's labs are reassuring and urine without signs of infection.  Pt receiving IVF.  Patient was able to walk to the bathroom and back without difficulty.  She still reports feeling slight dizziness but continues to be concerned about the shaking which she has now been experiencing for 8 months.  Discussed with her that she would need to continue to follow-up with her psychiatrist and her primary doctor.  She has seen neurology who did not feel that the tremor was related to any issue with her brain per the patient.  At this time she does not appear to need further testing and is stable for discharge.   Doretha Folks, MD 04/07/24 2020

## 2024-04-07 NOTE — Discharge Instructions (Addendum)
 Thankfully today all your lab work looked okay.  Your imaging was also okay.  However you were dehydrated and because you had not been eating and drinking well your blood pressure dropped when you stood up.  After fluids that has resolved.  You are going to have to make sure you are getting in plenty of fluids and if you cannot stand to eat make sure you are drinking protein shakes such as Ensure or boost so that you can get nutrition.

## 2024-04-07 NOTE — ED Provider Notes (Signed)
 " [place] EMERGENCY DEPARTMENT AT [facility] Provider Note   CSN: [id number] Arrival date & time: 04/07/24  1350     Patient presents with: Near Syncope   Lacey Salas is a 79 y.o. female.   Is a 79 year old female presenting emergency department for lightheadedness/near syncope.  Reports symptoms worse today.  Has been having decreased appetite secondary to her depression.  Her psychiatrist has been making medication adjustments.  Today has been feeling quite lightheaded when she gets up from a standing position.  Has not passed out.  Denies chest pain shortness of breath palpitations.  Also having tremors that have been ongoing for a few years that seem to be somewhat progressively getting worse.  She has been evaluated by neurology in the past per her report.   Near Syncope       Prior to Admission medications  Medication Sig Start Date End Date Taking? Authorizing Provider  acetaminophen  (TYLENOL ) 500 MG tablet Take 1,000 mg by mouth every 6 (six) hours as needed for mild pain (pain score 1-3) or moderate pain (pain score 4-6).   Yes [provider]  atenolol  (TENORMIN ) 25 MG tablet Take 12.5 mg by mouth 2 (two) times daily. 11/11/23  Yes [provider]  DULoxetine  (CYMBALTA ) 30 MG capsule Take 30 mg by mouth daily.   Yes [provider]  LORazepam  (ATIVAN ) 0.5 MG tablet Take 0.5 mg by mouth as needed for anxiety. 03/02/24  Yes [provider]  QUEtiapine  (SEROQUEL ) 25 MG tablet Take 1 tablet (25 mg total) by mouth at bedtime. 04/04/24 05/04/24 Yes Vickey Mettle, MD    Allergies: Cephalexin, Epinephrine, Erythromycin, Lactated ringers, Nortriptyline , Penicillins, Sulfamethoxazole-trimethoprim, Sulfonamide derivatives, Trimethoprim, and Wellbutrin  [bupropion ]    Review of Systems  Cardiovascular:  Positive for near-syncope.    Updated Vital Signs BP (!) 143/86 (BP Location: Right Arm)   Pulse 82   Temp 97.9 F (36.6 C) (Oral)    Resp (!) 22   Ht 5' 3 (1.6 m)   Wt 59 kg   SpO2 99%   BMI 23.03 kg/m   Physical Exam Vitals and nursing note reviewed.  Constitutional:      General: She is not in acute distress.    Appearance: She is not toxic-appearing.  HENT:     Head: Normocephalic.     Nose: Nose normal.     Mouth/Throat:     Mouth: Mucous membranes are dry.  Eyes:     Conjunctiva/sclera: Conjunctivae normal.  Cardiovascular:     Rate and Rhythm: Normal rate and regular rhythm.     Pulses: Normal pulses.  Pulmonary:     Effort: Pulmonary effort is normal.     Breath sounds: Normal breath sounds.  Abdominal:     General: Abdomen is flat. There is no distension.     Tenderness: There is no abdominal tenderness. There is no guarding or rebound.  Musculoskeletal:     Right lower leg: No edema.     Left lower leg: No edema.  Skin:    General: Skin is warm.     Capillary Refill: Capillary refill takes less than 2 seconds.  Neurological:     Mental Status: She is alert and oriented to person, place, and time.  Psychiatric:     Comments: Anxious appearing     (all labs ordered are listed, but only abnormal results are displayed) Labs Reviewed  COMPREHENSIVE METABOLIC PANEL WITH GFR - Abnormal; Notable for the following components:  Result Value   Glucose, Bld 108 (*)    All other components within normal limits  CBC - Abnormal; Notable for the following components:   WBC 11.9 (*)    All other components within normal limits  URINALYSIS, ROUTINE W REFLEX MICROSCOPIC - Abnormal; Notable for the following components:   APPearance HAZY (*)    Leukocytes,Ua SMALL (*)    Bacteria, UA RARE (*)    All other components within normal limits  LIPASE, BLOOD  CBG MONITORING, ED    EKG: EKG Interpretation Date/Time:  Friday April 07 2024 14:20:53 EST Ventricular Rate:  69 PR Interval:  161 QRS Duration:  92 QT Interval:  424 QTC Calculation: 455 R Axis:   55  Text Interpretation: Sinus  rhythm Atrial premature complexes in couplets Abnormal R-wave progression, early transition Minimal ST depression, diffuse leads Confirmed by Neysa Clap [phone]) on 04/07/2024 3:08:10 PM  Radiology: No results found.   Procedures   Medications Ordered in the ED  ALPRAZolam SHEFFIELD) tablet 0.5 mg (has no administration in time range)  sodium chloride  0.9 % bolus 1,000 mL (1,000 mLs Intravenous New Bag/Given 04/07/24 1505)    Clinical Course as of 04/07/24 1555  Fri Apr 07, 2024  1420 Per neurology note: Tremor: Started noticing tremors in 2023.  She notices it with use, particularly when holding objects such as writing, holding utensils.  It shakes when she is holding the bible up at church.  Not at rest.  Notices it slightly in the left hand.  She has complex regional pain syndrome in the right hand.  It has progressively gotten worse.  Balance is poor.  She has pain an discoloration in the toes.  She has spinal stenosis in the lumbar spine.  No family history of tremor.    [TY]    Clinical Course User Index [TY] Neysa Clap PARAS, DO                                 Medical Decision Making 79 year old female presenting emergency department with lightheadedness/near syncope.  Orthostatic with EMS from from 130 down to 80 systolic.  Decreased appetite for the past several days.  She quite anxious on exam.  Has some resting tremors.  Per chart review does not appear that these are necessarily a new thing and she does follow with neurology outpatient.  Not having chest pain or shortness of breath.  No infectious symptoms.  She has no fever or tachycardia, does have mild leukocytosis.  No anemia.  Comprehensive panel with no significant metabolic derangements.  Normal kidney function.  No transaminitis.  Lipase normal.  Nothing that would explain her decreased appetite.  Perhaps medication side effects as per chart review they have been adjusting some of her psych medications.  Her UA does show  some leukocytes and bacteria.  Does endorse some frequency.  Is receiving IV fluids, still feels unwell.  Anxious appearing.  Will give some Xanax.  Care signed out to afternoon team dispo pending completion of IV fluids and reevaluation.  Amount and/or Complexity of Data Reviewed Labs: ordered. Radiology: ordered and independent interpretation performed.    Details: Do not appreciate white mediastinum or pneumothorax. ECG/medicine tests: independent interpretation performed.    Details: No ischemic changes  Risk Prescription drug management. Decision regarding hospitalization. Diagnosis or treatment significantly limited by social determinants of health.      Final diagnoses:  None  ED Discharge Orders     None          Neysa Caron PARAS, DO 04/07/24 1555  "

## 2024-04-07 NOTE — ED Notes (Signed)
 Pt ambulated around the unit without difficulty. States her legs feel a little weak but also states she feels this is due to her poor intake. Pt complains of dizziness at the end of the walk once she made it back to her room.

## 2024-04-07 NOTE — ED Triage Notes (Addendum)
 Pt bib [facility] coming from home. EMS called out for near syncope. Pt reports dizziness upon standing and tremors. Pt states the tremors have been ongoing for a few months but she has not been evaluated for that. EMS reports positive orthostatic change upon standing 130/140 to 86/60. Pt reports decreased appetite as well. Pt reports taking 0.5 lorazepam  before arrival. GCS 15.  EMS VS: 146/84 78 HR 98% RA 101 cbg  500 NS

## 2024-04-10 ENCOUNTER — Encounter: Payer: Self-pay | Admitting: Psychiatry

## 2024-04-10 ENCOUNTER — Telehealth: Admitting: Psychiatry

## 2024-04-10 DIAGNOSIS — F331 Major depressive disorder, recurrent, moderate: Secondary | ICD-10-CM

## 2024-04-10 DIAGNOSIS — F411 Generalized anxiety disorder: Secondary | ICD-10-CM | POA: Diagnosis not present

## 2024-04-10 DIAGNOSIS — G4700 Insomnia, unspecified: Secondary | ICD-10-CM | POA: Diagnosis not present

## 2024-04-10 MED ORDER — DULOXETINE HCL 20 MG PO CPEP: 40.0000 mg | ORAL_CAPSULE | Freq: Every day | ORAL | 0 refills | Status: AC

## 2024-04-10 NOTE — Patient Instructions (Addendum)
 Discontinue quetiapine   Start olanzapine  5 mg at night  Increase duloxetine  40 mg daily after a week of starting olanzapine  Obtain lab through PCP (vitamin B12, folate, vitamin B1) Next appointment- 2/19 at 10:30

## 2024-04-10 NOTE — Progress Notes (Signed)
 Virtual Visit via Video Note  I connected with Lacey Salas on 04/10/24 at  2:00 PM EST by a video enabled telemedicine application and verified that I am speaking with the correct person using two identifiers.  Location: Patient: home Provider: home office Persons participated in the visit- patient, provider    I discussed the limitations of evaluation and management by telemedicine and the availability of in person appointments. The patient expressed understanding and agreed to proceed.    I discussed the assessment and treatment plan with the patient. The patient was provided an opportunity to ask questions and all were answered. The patient agreed with the plan and demonstrated an understanding of the instructions.   The patient was advised to call back or seek an in-person evaluation if the symptoms worsen or if the condition fails to improve as anticipated.   Katheren Sleet, MD     [facility] MD/PA/NP OP Progress Note  04/10/2024 2:37 PM Kanetra Ho  MRN:  [id number]  Chief Complaint:  Chief Complaint  Patient presents with   Follow-up   HPI:  According to the chart review, the following events have occurred since the last visit: The patient was seen at ED for dizziness, Labs without significant abnormality except small leukocytes in urine. Orthostatic with EMS from from 130 down to 80 systolic. IVC was given.  BP (!) 143/86 (BP Location: Right Arm)  Pulse 82  Temp 97.9 F (36.6 C) (Oral)  Resp (!) 22  Ht 5' 3 (1.6 m)  Wt 59 kg  SpO2 99%  BMI 23.03 kg/m  03/2024   This is a follow-up appointment for depression and anxiety, insomnia.  She states that she has been feeling shaking.  She feels nervous and dizzy.  Although she has been able to sleep up to 7 hours, she feels horrible the next day.  She believes olanzapine  was better and that the aspect.  She does not have appetite.  She tries to eat cereal, egg, drinking boost (not containing caffeine), water.  She sits all day.  She feels hopeless.  She denies SI, hallucinations.  She repeatedly asked the questions when she was instructed to make medication adjustment.   Dan, her husband presents to the visit.  He states that she does not have any energy.  She is shaking all day.  He thinks she was doing better when she was on olanzapine .  She appears to be shaking inside than outside.  She has a fear and is afraid of everything.  He agrees with the plans as outlined below.    Visit Diagnosis:    ICD-10-CM   1. MDD (major depressive disorder), recurrent episode, moderate (HCC)  F33.1     2. GAD (generalized anxiety disorder)  F41.1       Past Psychiatric History: Please see initial evaluation for full details. I have reviewed the history. No updates at this time.     Past Medical History:  Past Medical History:  Diagnosis Date   Anxiety    Depression    GERD (gastroesophageal reflux disease)    History of chicken pox    Hyperlipidemia    Migraine    complicated migraines with transient R side facial paralysis and aphasia   Migraine headache with aura    hemiplegic migraine   MVP (mitral valve prolapse)    Osteopenia    Reflex sympathetic dystrophy, unspecified 2011   R hand related to wrist fx, improved s/p nerve blocks   Seizures (HCC)  Stevens-Johnson disease     Past Surgical History:  Procedure Laterality Date   ABDOMINAL HYSTERECTOMY     BSO   CATARACT EXTRACTION     HEEL SPUR SURGERY  12/1996   right   ORIF DISTAL RADIUS FRACTURE  2004    Family Psychiatric History: Please see initial evaluation for full details. I have reviewed the history. No updates at this time.     Family History:  Family History  Problem Relation Age of Onset   Arthritis Mother    Arthritis Father    Breast cancer Sister    Cancer Sister    Depression Brother    Leukemia Brother        CLL   Cancer Brother    Bipolar disorder Brother    Suicidality Paternal Aunt    Suicidality Paternal Uncle     Arthritis Other    Ovarian cancer Other    Hyperlipidemia Other    Hypertension Other    Stroke Other    Colon cancer Neg Hx     Social History:  Social History   Socioeconomic History   Marital status: Legally Separated    Spouse name: Not on file   Number of children: 2   Years of education: Not on file   Highest education level: Associate degree: academic program  Occupational History    Employer: RETIRED  Tobacco Use   Smoking status: Every Day    Current packs/day: 1.00    Types: Cigarettes   Smokeless tobacco: Never   Tobacco comments:    Quit over 30 years ago( just needs it a bit now ) Stress   Substance and Sexual Activity   Alcohol use: No   Drug use: No   Sexual activity: Never  Other Topics Concern   Not on file  Social History Narrative   Right handed   Lives with alone in a two story home   Social Drivers of Health   Tobacco Use: High Risk (04/07/2024)   Patient History    Smoking Tobacco Use: Every Day    Smokeless Tobacco Use: Never    Passive Exposure: Not on file  Financial Resource Strain: Low Risk (09/26/2023)   Received from [facility]   Overall Financial Resource Strain (CARDIA)    How hard is it for you to pay for the very basics like food, housing, medical care, and heating?: Not hard at all  Food Insecurity: No Food Insecurity (09/26/2023)   Received from [facility]   Epic    Within the past 12 months, you worried that your food would run out before you got the money to buy more.: Never true    Within the past 12 months, the food you bought just didn't last and you didn't have money to get more.: Never true  Transportation Needs: No Transportation Needs (09/26/2023)   Received from [facility]    In the past 12 months, has lack of transportation kept you from medical appointments or from getting medications?: No    In the past 12 months, has lack of transportation kept you from meetings, work, or from getting things needed  for daily living?: No  Physical Activity: Inactive (09/26/2023)   Received from [facility]   Exercise Vital Sign    On average, how many days per week do you engage in moderate to strenuous exercise (like a brisk walk)?: 0 days    Minutes of Exercise per Session: Not on file  Stress:  Stress Concern Present (09/26/2023)   Received from [facility] - Occupational Stress Questionnaire    Do you feel stress - tense, restless, nervous, or anxious, or unable to sleep at night because your mind is troubled all the time - these days?: Rather much  Social Connections: Socially Integrated (09/26/2023)   Received from [facility]   Social Network    How would you rate your social network (family, work, friends)?: Good participation with social networks  Depression (PHQ2-9): High Risk (03/23/2024)   Depression (PHQ2-9)    PHQ-2 Score: 16  Alcohol Screen: Not on file  Housing: Low Risk (09/26/2023)   Received from [facility]    In the last 12 months, was there a time when you were not able to pay the mortgage or rent on time?: No    In the past 12 months, how many times have you moved where you were living?: 0    At any time in the past 12 months, were you homeless or living in a shelter (including now)?: No  Utilities: Not At Risk (09/26/2023)   Received from [facility]    In the past 12 months has the electric, gas, oil, or water company threatened to shut off services in your home?: No  Health Literacy: Not on file    Allergies: Allergies[1]  Metabolic Disorder Labs: Lab Results  Component Value Date   HGBA1C 5.3 06/19/2020   MPG 105 06/19/2020   MPG 108 06/14/2008   No results found for: PROLACTIN Lab Results  Component Value Date   CHOL 216 (H) 06/19/2020   TRIG 283 (H) 06/19/2020   HDL 45 (L) 06/19/2020   CHOLHDL 4.8 06/19/2020   VLDL 61.5 10/19/2016   LDLCALC 127 (H) 06/19/2020   LDLCALC 125 (H) 10/19/2016    Lab Results  Component Value Date   TSH 1.680 03/28/2024   TSH 1.79 12/14/2022    Therapeutic Level Labs: No results found for: LITHIUM No results found for: VALPROATE No results found for: CBMZ  Current Medications: Current Outpatient Medications  Medication Sig Dispense Refill   DULoxetine  (CYMBALTA ) 20 MG capsule Take 2 capsules (40 mg total) by mouth daily. 60 capsule 0   OLANZapine  (ZYPREXA ) 5 MG tablet Take 5 mg by mouth at bedtime.     acetaminophen  (TYLENOL ) 500 MG tablet Take 1,000 mg by mouth every 6 (six) hours as needed for mild pain (pain score 1-3) or moderate pain (pain score 4-6).     atenolol  (TENORMIN ) 25 MG tablet Take 12.5 mg by mouth 2 (two) times daily.     DULoxetine  (CYMBALTA ) 30 MG capsule Take 30 mg by mouth daily.     LORazepam  (ATIVAN ) 0.5 MG tablet Take 0.5 mg by mouth as needed for anxiety.     No current facility-administered medications for this visit.     Musculoskeletal: Strength & Muscle Tone: N/A Gait & Station: N/A Patient leans: N/A  Psychiatric Specialty Exam: Review of Systems  Psychiatric/Behavioral:  Positive for decreased concentration, dysphoric mood and sleep disturbance. Negative for agitation, behavioral problems, confusion, hallucinations, self-injury and suicidal ideas. The patient is nervous/anxious. The patient is not hyperactive.   All other systems reviewed and are negative.   There were no vitals taken for this visit.There is no height or weight on file to calculate BMI.  General Appearance: Well Groomed  Eye Contact:  Good  Speech:  Clear and Coherent  Volume:  Normal  Mood:  Anxious and Depressed  Affect:  Appropriate, Congruent, and Restricted  Thought Process:  Coherent  Orientation:  Full (Time, Place, and Person)  Thought Content: Logical   Suicidal Thoughts:  No  Homicidal Thoughts:  No  Memory:  Immediate;   Good  Judgement:  Good  Insight:  Fair  Psychomotor Activity:  Increased, lip shivering   Concentration:  Concentration: Poor and Attention Span: Poor  Recall:  Good  Fund of Knowledge: Good  Language: Good  Akathisia:  No  Handed:  Right  AIMS (if indicated): not done  Assets:  Communication Skills Desire for Improvement  ADL's:  Intact  Cognition: WNL  Sleep:  Fair   Screenings: AUDIT    Flowsheet Row [facility]  Alcohol Use Disorder Identification Test Final Score (AUDIT) 0   GAD-7    Flowsheet Row Office Visit from 03/23/2024 in [facility]  Total GAD-7 Score 12   Mini-Mental    Flowsheet Row Office Visit from 03/10/2017 in [facility]  Total Score (max 30 points ) 19   PHQ2-9    Flowsheet Row Office Visit from 03/23/2024 in [facility] Clinical Support from 10/05/2017 in [facility] [place] Clinical Support from 10/01/2016 in [facility] [place] Office Visit from 09/02/2015 in [facility] Office Visit from 05/16/2014 in [facility]  PHQ-2 Total Score 6 2 0 3 2  PHQ-9 Total Score 16 -- -- 16 12   Flowsheet Row ED from 04/07/2024 in [facility] Emergency Department at [facility] ED from 08/07/2023 in [facility] ED from 07/13/2023 in [facility] Emergency Department at [facility]  C-SSRS RISK CATEGORY No Risk No Risk No Risk     Assessment and Plan:  Lacey Salas is a 79 y.o.  female with a history of mood disorder, depression, anxiety, dysphagia, GERD, migraine, complex regional pain syndrome of right hand, SJS from [place], who presents for follow up appointment for below.    1. MDD (major depressive disorder), recurrent episode, moderate (HCC) 2. GAD (generalized anxiety disorder) She has family history of bipolar disorder. She had miscarriage when  she moved to [place]  over 40 years ago (traumatic). She reports good relationship with her husband and her children.  History: admitted to [facility] in 2015 due to concern of SI after she commented if the lord wants to call me home.  she reports significant worsening in depressive symptoms and anxiety since April 2024 where she had an episode of AMS.   The exam is notable for intense anxiety , and she required repeated instruction.  She reports worsening in depressive symptoms, anxiety, and akathisia like symptoms since starting quetiapine  despite that it helped her for insomnia.  Due to this possible adverse reaction, will discontinue quetiapine .  Given both the patient and her husband report some benefit for appetite and insomnia from olanzapine , this medication will be restarted from lower dose.  Will monitor for any akathisia, and a possible other side effect which includes but not limited to orthostatic hypotension.  Will uptitrate duloxetine  slowly to optimize treatment for depression and anxiety.  If no benefit, will consider switching to fluoxetine.  Discussed potential risk of hypertension.   3. Insomnia, unspecified type Although it has been overall improving, she feels worse in the morning with quetiapine .  Olanzapine  will be restarted in replace of this medication.    # appetite loss # dizziness She noted to have orthostatic hypotension.  Will discontinue quetiapine .  Will restart lower dose of olanzapine  to reduce this risk.  She was also advised to obtain labs through her PCP for vitamin deficiency.    # r/o cognitive impairment The exam is notable for short-term memory loss, which is evidenced by her asking repeated questions- this is consistent since initial visit.  However, she is able to explain her medical condition in details and past history.  This could be related to the anxiety she is experiencing.  Will continue to monitor and intervene as accordingly.    3. High risk medication  use       Last checked  EKG HR 82, QTc4107msec 03/2024  Lipid panels      HbA1c        Plan Discontinue quetiapine   Start olanzapine  5 mg at night  Increase duloxetine  40 mg daily after a week of starting olanzapine  Obtain lab through PCP (vitamin B12, folate, vitamin B1) Next appointment- 2/19 at 10:30, IP. - on lorazepam  0.5 mg twice daily as needed and daily atenolol .    Past trials-  citalopram ,venlafaxine  (racing mind, cannot sit still), mirtazapine  (possible dizziness), quetiapine  (felt worse in morning, orthostatic hypotension)  The patient demonstrates the following risk factors for suicide: Chronic risk factors for suicide include: psychiatric disorder of depression, anxiety. Acute risk factors for suicide include: N/A. Protective factors for this patient include: positive social support, coping skills, and hope for the future. Considering these factors, the overall suicide risk at this point appears to be low. Patient is appropriate for outpatient follow up.   Collaboration of Care: Collaboration of Care: Other reviewed notes in Epic  Patient/Guardian was advised Release of Information must be obtained prior to any record release in order to collaborate their care with an outside provider. Patient/Guardian was advised if they have not already done so to contact the registration department to sign all necessary forms in order for us  to release information regarding their care.   Consent: Patient/Guardian gives verbal consent for treatment and assignment of benefits for services provided during this visit. Patient/Guardian expressed understanding and agreed to proceed.    Katheren Sleet, MD 04/10/2024, 2:37 PM     [1]  Allergies Allergen Reactions   Cephalexin     REACTION: Mannie Louder   Epinephrine     REACTION: hypertensive reaction   Erythromycin     REACTION: Rash   Lactated Ringers     REACTION: cardiac symptoms   Nortriptyline      Rapid heart rate    Penicillins     REACTION: Rash   Sulfamethoxazole-Trimethoprim     REACTION: Mannie Louder   Sulfonamide Derivatives    Trimethoprim     REACTION: stevens johnson syndrome   Wellbutrin  [Bupropion ] Other (See Comments)    tremors

## 2024-04-12 NOTE — Progress Notes (Signed)
 Subjective   Patient ID:  Lacey Salas is a 79 y.o. (DOB 1946-03-12) female.      Patient presents with   Follow-up    Pt presents today to discuss medication. Medication she has been giving are not working Seroquel .     Had syncopal episode on seroquel  States that she does not want to see the psychiatrist I had sent her to She has appt with Dr Cam in April and agrees to go to that Remains severely depressed Cymbalta  has not helped at all Felt much better on 10mg  olanzapine  Feels very anxious and is trying not to take too much lorazepam  Denies s/h I/p Short Social History[1] Family History[2] Past Medical History:  Diagnosis Date   Fibromyalgia    GERD (gastroesophageal reflux disease)    Migraine    Osteoarthritis    Spinal stenosis at L4-L5 level    Review of Systems is complete and negative except as noted.  Objective   BP 123/79 (BP Location: Right Upper Arm, Patient Position: Sitting)   Pulse 81   Temp 98.6 F (37 C) (Oral)   Resp 16   Ht 5' 3 (1.6 m)   Wt 132 lb 6.4 oz (60.1 kg)   SpO2 95%   BMI 23.45 kg/m  General:  Well developed, Well nourished, No distress HEENT: Normocephalic, atraumatic;  Neck:  Supple  Lungs:  normal effort Skin:  No focal rashes  Extremities:  No clubbing, cyanosis or edema.   Neuro:  tremor of extremities. Mild oral dyskinesia Psych. Flat affect. Speech appropriate in r/r/c   Impression   1. Near syncope   2. Dehydration   3. Mood disorder   4. Therapeutic drug monitoring   5. Meningioma (*)     Plan  1/2. Resolved with IVF and stopping sroquel 3. Stop cymbalta . She has failed multiple ssri, remron, seroquel  and abilify. She has done best with olanzapine . Some oral dyskinesia. I do wonder if she would benefit from ECT. I have recommended lamotrigine but she has declined due to risk of SJS. She is to call me in 1 week with lorazepam  BID and olanzapine . She will consider lamotrigine at that point. Recheck 4  weeks 4. Check labs 5. stable   Patient's Medications       * Accurate as of April 12, 2024  1:25 PM. Reflects encounter med changes as of last refresh          Continued Medications      Instructions  acetaminophen  650 MG CR tablet Commonly known as: TYLENOL  ARTHRITIS,MAPAP  Daily   atenolol  25 mg tablet Commonly known as: TENORMIN   25 mg, Oral, Daily   cyanocobalamin  500 MCG tablet  500 mcg, Daily   esomeprazole  magnesium  40 mg capsule Commonly known as: NEXIUM   40 mg, As needed   LORAzepam  0.5 mg tablet Commonly known as: ATIVAN   0.5 mg, Oral, 2 times a day   magnesium  30 MG tablet  30 mg, 2 times a day       Modified Medications      Instructions  * OLANZapine  5 mg tablet Commonly known as: ZYPREXA  What changed: Another medication with the same name was added. Make sure you understand how and when to take each. Changed by: Lamar Beam  5-10 mg, Oral, At bedtime   * OLANZapine  10 mg tablet Commonly known as: ZYPREXA  What changed: You were already taking a medication with the same name, and this prescription was added. Make sure you understand how and  when to take each. Changed by: Robert Beam  10 mg, Oral, At bedtime      * * This list has 2 medication(s) that are the same as other medications prescribed for you. Read the directions carefully, and ask your doctor or other care provider to review them with you.          Discontinued Medications    DULoxetine  HCl 30 mg capsule Commonly known as: CYMBALTA  Stopped by: Lamar Cornet        Orders Placed This Encounter  Procedures   CBC And Differential   Comprehensive Metabolic Panel   Vitamin B12 and Folate   Vitamin B12    Risks, benefits, and alternatives of the medications and treatment plan prescribed today were discussed, and patient expressed understanding. Plan follow-up as discussed or as needed if any worsening symptoms or change in condition.              [1] Social History Tobacco Use   Smoking status: Every Day    Current packs/day: 0.50    Average packs/day: 0.5 packs/day for 42.1 years (21.0 ttl pk-yrs)    Types: Cigarettes    Start date: 03/16/1982    Passive exposure: Current   Smokeless tobacco: Never  Vaping Use   Vaping status: Never Used  Substance Use Topics   Alcohol use: Never   Drug use: Never  [2] Family History Problem Relation Name Age of Onset   Arthritis Father      Mother          rheumatoid   Cancer Brother          leukemia    Sister          breast   Multiple myeloma Brother     Pneumonia Father    *Some images could not be shown.

## 2024-04-18 ENCOUNTER — Ambulatory Visit: Admitting: Psychiatry

## 2024-04-19 ENCOUNTER — Telehealth: Payer: Self-pay | Admitting: Psychiatry

## 2024-04-19 NOTE — Telephone Encounter (Signed)
 Lacey Salas called to cancel her 2/19th follow up and not to RS. She is looking for a new provider that is closer to her in [place].

## 2024-05-04 ENCOUNTER — Ambulatory Visit: Admitting: Psychiatry
